# Patient Record
Sex: Female | Born: 1935 | Race: Black or African American | Hispanic: No | Smoking: Never smoker
Health system: Southern US, Community
[De-identification: ages and names within clinical notes are randomized; demographics above are authoritative.]

## PROBLEM LIST (undated history)

## (undated) DIAGNOSIS — G4733 Obstructive sleep apnea (adult) (pediatric): Secondary | ICD-10-CM

## (undated) DIAGNOSIS — D649 Anemia, unspecified: Secondary | ICD-10-CM

## (undated) DIAGNOSIS — I890 Lymphedema, not elsewhere classified: Secondary | ICD-10-CM

## (undated) DIAGNOSIS — E785 Hyperlipidemia, unspecified: Secondary | ICD-10-CM

## (undated) DIAGNOSIS — I509 Heart failure, unspecified: Secondary | ICD-10-CM

## (undated) DIAGNOSIS — E559 Vitamin D deficiency, unspecified: Secondary | ICD-10-CM

## (undated) DIAGNOSIS — K922 Gastrointestinal hemorrhage, unspecified: Secondary | ICD-10-CM

## (undated) DIAGNOSIS — E119 Type 2 diabetes mellitus without complications: Secondary | ICD-10-CM

## (undated) DIAGNOSIS — I10 Essential (primary) hypertension: Secondary | ICD-10-CM

## (undated) DIAGNOSIS — F0390 Unspecified dementia without behavioral disturbance: Secondary | ICD-10-CM

## (undated) DIAGNOSIS — Z9989 Dependence on other enabling machines and devices: Secondary | ICD-10-CM

## (undated) DIAGNOSIS — M1990 Unspecified osteoarthritis, unspecified site: Secondary | ICD-10-CM

## (undated) DIAGNOSIS — N189 Chronic kidney disease, unspecified: Secondary | ICD-10-CM

## (undated) HISTORY — PX: ABDOMINAL HYSTERECTOMY: SHX81

## (undated) HISTORY — DX: Unspecified osteoarthritis, unspecified site: M19.90

## (undated) HISTORY — DX: Gastrointestinal hemorrhage, unspecified: K92.2

## (undated) HISTORY — DX: Vitamin D deficiency, unspecified: E55.9

## (undated) HISTORY — DX: Chronic kidney disease, unspecified: N18.9

## (undated) HISTORY — DX: Heart failure, unspecified: I50.9

## (undated) HISTORY — DX: Lymphedema, not elsewhere classified: I89.0

## (undated) HISTORY — DX: Unspecified dementia, unspecified severity, without behavioral disturbance, psychotic disturbance, mood disturbance, and anxiety: F03.90

## (undated) HISTORY — DX: Hyperlipidemia, unspecified: E78.5

## (undated) HISTORY — DX: Anemia, unspecified: D64.9

## (undated) HISTORY — DX: Type 2 diabetes mellitus without complications: E11.9

---

## 2012-04-06 DIAGNOSIS — M5137 Other intervertebral disc degeneration, lumbosacral region: Secondary | ICD-10-CM | POA: Insufficient documentation

## 2013-09-28 DIAGNOSIS — E1122 Type 2 diabetes mellitus with diabetic chronic kidney disease: Secondary | ICD-10-CM | POA: Insufficient documentation

## 2013-09-28 DIAGNOSIS — E1121 Type 2 diabetes mellitus with diabetic nephropathy: Secondary | ICD-10-CM | POA: Diagnosis present

## 2014-01-31 DIAGNOSIS — E538 Deficiency of other specified B group vitamins: Secondary | ICD-10-CM | POA: Insufficient documentation

## 2014-09-29 DIAGNOSIS — I340 Nonrheumatic mitral (valve) insufficiency: Secondary | ICD-10-CM | POA: Diagnosis present

## 2014-09-29 DIAGNOSIS — I351 Nonrheumatic aortic (valve) insufficiency: Secondary | ICD-10-CM | POA: Diagnosis present

## 2015-03-31 DIAGNOSIS — E559 Vitamin D deficiency, unspecified: Secondary | ICD-10-CM | POA: Insufficient documentation

## 2015-07-10 DIAGNOSIS — M48062 Spinal stenosis, lumbar region with neurogenic claudication: Secondary | ICD-10-CM | POA: Diagnosis present

## 2016-12-20 DIAGNOSIS — K551 Chronic vascular disorders of intestine: Secondary | ICD-10-CM | POA: Diagnosis present

## 2017-04-01 DIAGNOSIS — R600 Localized edema: Secondary | ICD-10-CM

## 2017-04-01 DIAGNOSIS — R609 Edema, unspecified: Secondary | ICD-10-CM

## 2017-06-19 DIAGNOSIS — I5030 Unspecified diastolic (congestive) heart failure: Secondary | ICD-10-CM | POA: Insufficient documentation

## 2017-09-28 DIAGNOSIS — E611 Iron deficiency: Secondary | ICD-10-CM | POA: Insufficient documentation

## 2018-07-08 DIAGNOSIS — E785 Hyperlipidemia, unspecified: Secondary | ICD-10-CM | POA: Diagnosis present

## 2018-07-08 DIAGNOSIS — D631 Anemia in chronic kidney disease: Secondary | ICD-10-CM | POA: Diagnosis present

## 2018-07-08 DIAGNOSIS — E876 Hypokalemia: Secondary | ICD-10-CM | POA: Insufficient documentation

## 2018-07-08 DIAGNOSIS — R001 Bradycardia, unspecified: Secondary | ICD-10-CM | POA: Insufficient documentation

## 2018-07-08 DIAGNOSIS — N189 Chronic kidney disease, unspecified: Secondary | ICD-10-CM | POA: Diagnosis present

## 2021-01-04 DIAGNOSIS — Z8719 Personal history of other diseases of the digestive system: Secondary | ICD-10-CM | POA: Insufficient documentation

## 2021-03-01 DIAGNOSIS — Z7409 Other reduced mobility: Secondary | ICD-10-CM | POA: Insufficient documentation

## 2021-03-01 DIAGNOSIS — F0390 Unspecified dementia without behavioral disturbance: Secondary | ICD-10-CM | POA: Diagnosis present

## 2021-06-02 DIAGNOSIS — K5790 Diverticulosis of intestine, part unspecified, without perforation or abscess without bleeding: Secondary | ICD-10-CM | POA: Insufficient documentation

## 2021-07-01 ENCOUNTER — Emergency Department: Payer: Medicare PPO

## 2021-07-01 ENCOUNTER — Encounter: Payer: Self-pay | Admitting: Internal Medicine

## 2021-07-01 ENCOUNTER — Observation Stay
Admission: EM | Admit: 2021-07-01 | Discharge: 2021-07-02 | Disposition: A | Discharge status: Home / Self Care | Payer: Medicare PPO | Attending: Internal Medicine | Admitting: Internal Medicine

## 2021-07-01 DIAGNOSIS — K219 Gastro-esophageal reflux disease without esophagitis: Secondary | ICD-10-CM

## 2021-07-01 DIAGNOSIS — I130 Hypertensive heart and chronic kidney disease with heart failure and stage 1 through stage 4 chronic kidney disease, or unspecified chronic kidney disease: Secondary | ICD-10-CM | POA: Diagnosis not present

## 2021-07-01 DIAGNOSIS — N183 Chronic kidney disease, stage 3 unspecified: Secondary | ICD-10-CM

## 2021-07-01 DIAGNOSIS — I890 Lymphedema, not elsewhere classified: Secondary | ICD-10-CM | POA: Diagnosis not present

## 2021-07-01 DIAGNOSIS — N179 Acute kidney failure, unspecified: Secondary | ICD-10-CM | POA: Insufficient documentation

## 2021-07-01 DIAGNOSIS — R0602 Shortness of breath: Secondary | ICD-10-CM | POA: Diagnosis present

## 2021-07-01 DIAGNOSIS — R609 Edema, unspecified: Secondary | ICD-10-CM

## 2021-07-01 DIAGNOSIS — F5101 Primary insomnia: Secondary | ICD-10-CM

## 2021-07-01 DIAGNOSIS — G4700 Insomnia, unspecified: Secondary | ICD-10-CM

## 2021-07-01 DIAGNOSIS — R600 Localized edema: Secondary | ICD-10-CM

## 2021-07-01 DIAGNOSIS — I509 Heart failure, unspecified: Secondary | ICD-10-CM | POA: Diagnosis not present

## 2021-07-01 DIAGNOSIS — Z20822 Contact with and (suspected) exposure to covid-19: Secondary | ICD-10-CM | POA: Insufficient documentation

## 2021-07-01 DIAGNOSIS — E1122 Type 2 diabetes mellitus with diabetic chronic kidney disease: Secondary | ICD-10-CM | POA: Insufficient documentation

## 2021-07-01 DIAGNOSIS — M8949 Other hypertrophic osteoarthropathy, multiple sites: Secondary | ICD-10-CM

## 2021-07-01 DIAGNOSIS — G4733 Obstructive sleep apnea (adult) (pediatric): Secondary | ICD-10-CM

## 2021-07-01 DIAGNOSIS — T783XXA Angioneurotic edema, initial encounter: Secondary | ICD-10-CM

## 2021-07-01 DIAGNOSIS — F0390 Unspecified dementia without behavioral disturbance: Secondary | ICD-10-CM | POA: Diagnosis not present

## 2021-07-01 DIAGNOSIS — I10 Essential (primary) hypertension: Secondary | ICD-10-CM

## 2021-07-01 DIAGNOSIS — N1832 Chronic kidney disease, stage 3b: Secondary | ICD-10-CM | POA: Insufficient documentation

## 2021-07-01 DIAGNOSIS — Z9989 Dependence on other enabling machines and devices: Secondary | ICD-10-CM

## 2021-07-01 DIAGNOSIS — M1991 Primary osteoarthritis, unspecified site: Secondary | ICD-10-CM

## 2021-07-01 HISTORY — DX: Obstructive sleep apnea (adult) (pediatric): G47.33

## 2021-07-01 HISTORY — DX: Essential (primary) hypertension: I10

## 2021-07-01 HISTORY — DX: Obstructive sleep apnea (adult) (pediatric): Z99.89

## 2021-07-01 LAB — COMPREHENSIVE METABOLIC PANEL
ALT: 18 U/L (ref 0–44)
AST: 21 U/L (ref 15–41)
Albumin: 3.4 g/dL — ABNORMAL LOW (ref 3.5–5.0)
Alkaline Phosphatase: 76 U/L (ref 38–126)
Anion gap: 6 (ref 5–15)
BUN: 24 mg/dL — ABNORMAL HIGH (ref 8–23)
CO2: 29 mmol/L (ref 22–32)
Calcium: 8.6 mg/dL — ABNORMAL LOW (ref 8.9–10.3)
Chloride: 109 mmol/L (ref 98–111)
Creatinine, Ser: 1.49 mg/dL — ABNORMAL HIGH (ref 0.44–1.00)
GFR, Estimated: 32 mL/min — ABNORMAL LOW (ref >60)
Glucose, Bld: 127 mg/dL — ABNORMAL HIGH (ref 70–99)
Potassium: 3.7 mmol/L (ref 3.5–5.1)
Sodium: 144 mmol/L (ref 135–145)
Total Bilirubin: 1.3 mg/dL — ABNORMAL HIGH (ref 0.3–1.2)
Total Protein: 6.8 g/dL (ref 6.5–8.1)

## 2021-07-01 LAB — CBC WITH DIFFERENTIAL/PLATELET
Abs Immature Granulocytes: 0.06 10*3/uL (ref 0.00–0.07)
Basophils Absolute: 0 10*3/uL (ref 0.0–0.1)
Basophils Relative: 0 %
Eosinophils Absolute: 0.2 10*3/uL (ref 0.0–0.5)
Eosinophils Relative: 1 %
HCT: 31.9 % — ABNORMAL LOW (ref 36.0–46.0)
Hemoglobin: 10.1 g/dL — ABNORMAL LOW (ref 12.0–15.0)
Immature Granulocytes: 1 %
Lymphocytes Relative: 17 %
Lymphs Abs: 1.7 10*3/uL (ref 0.7–4.0)
MCH: 28.9 pg (ref 26.0–34.0)
MCHC: 31.7 g/dL (ref 30.0–36.0)
MCV: 91.4 fL (ref 80.0–100.0)
Monocytes Absolute: 0.9 10*3/uL (ref 0.1–1.0)
Monocytes Relative: 8 %
Neutro Abs: 7.7 10*3/uL (ref 1.7–7.7)
Neutrophils Relative %: 73 %
Platelets: 225 10*3/uL (ref 150–400)
RBC: 3.49 MIL/uL — ABNORMAL LOW (ref 3.87–5.11)
RDW: 17.2 % — ABNORMAL HIGH (ref 11.5–15.5)
WBC: 10.5 10*3/uL (ref 4.0–10.5)
nRBC: 0.2 % (ref 0.0–0.2)

## 2021-07-01 LAB — MAGNESIUM: Magnesium: 2.3 mg/dL (ref 1.7–2.4)

## 2021-07-01 LAB — PROCALCITONIN: Procalcitonin: <0.1 ng/mL

## 2021-07-01 LAB — RESP PANEL BY RT-PCR (FLU A&B, COVID) ARPGX2
Influenza A by PCR: NEGATIVE
Influenza B by PCR: NEGATIVE
SARS Coronavirus 2 by RT PCR: NEGATIVE

## 2021-07-01 LAB — GLUCOSE, CAPILLARY
Glucose-Capillary: 145 mg/dL — ABNORMAL HIGH (ref 70–99)
Glucose-Capillary: 115 mg/dL — ABNORMAL HIGH (ref 70–99)

## 2021-07-01 LAB — TROPONIN I (HIGH SENSITIVITY)
Troponin I (High Sensitivity): 72 ng/L — ABNORMAL HIGH (ref <18)
Troponin I (High Sensitivity): 77 ng/L — ABNORMAL HIGH (ref <18)

## 2021-07-01 LAB — BRAIN NATRIURETIC PEPTIDE: B Natriuretic Peptide: 1332.5 pg/mL — ABNORMAL HIGH (ref 0.0–100.0)

## 2021-07-01 LAB — TSH: TSH: 3.842 u[IU]/mL (ref 0.350–4.500)

## 2021-07-01 MED ORDER — TAB-A-VITE/IRON PO TABS
1.0000 | ORAL_TABLET | Freq: Every day | ORAL | Status: DC
  Administered 2021-07-01: 1 via ORAL
  Filled 2021-07-01 (×2): qty 1

## 2021-07-01 MED ORDER — SODIUM CHLORIDE 0.9% FLUSH
3.0000 mL | Freq: Two times a day (BID) | INTRAVENOUS | Status: DC
  Administered 2021-07-01 – 2021-07-02 (×2): 3 mL via INTRAVENOUS

## 2021-07-01 MED ORDER — AMLODIPINE BESYLATE 5 MG PO TABS
7.5000 mg | ORAL_TABLET | Freq: Every day | ORAL | Status: DC
  Administered 2021-07-01 – 2021-07-02 (×2): 7.5 mg via ORAL
  Filled 2021-07-01 (×2): qty 2

## 2021-07-01 MED ORDER — HYDRALAZINE HCL 20 MG/ML IJ SOLN: 5.0000 mg | Freq: Four times a day (QID) | INTRAMUSCULAR | Status: DC | PRN

## 2021-07-01 MED ORDER — ACETAMINOPHEN 325 MG PO TABS: 650.0000 mg | ORAL_TABLET | ORAL | Status: DC | PRN

## 2021-07-01 MED ORDER — ENOXAPARIN SODIUM 30 MG/0.3ML IJ SOSY
30.0000 mg | PREFILLED_SYRINGE | Freq: Every day | INTRAMUSCULAR | Status: DC
  Administered 2021-07-01: 30 mg via SUBCUTANEOUS
  Filled 2021-07-01: qty 0.3

## 2021-07-01 MED ORDER — VITAMIN B-12 1000 MCG PO TABS
1000.0000 ug | ORAL_TABLET | Freq: Every day | ORAL | Status: DC
  Administered 2021-07-01 – 2021-07-02 (×2): 1000 ug via ORAL
  Filled 2021-07-01 (×2): qty 1

## 2021-07-01 MED ORDER — FUROSEMIDE 10 MG/ML IJ SOLN
40.0000 mg | Freq: Every day | INTRAMUSCULAR | Status: DC
  Administered 2021-07-02: 40 mg via INTRAVENOUS
  Filled 2021-07-01: qty 4

## 2021-07-01 MED ORDER — VITAMIN D 25 MCG (1000 UNIT) PO TABS
1000.0000 [IU] | ORAL_TABLET | Freq: Every day | ORAL | Status: DC
  Administered 2021-07-01 – 2021-07-02 (×2): 1000 [IU] via ORAL
  Filled 2021-07-01 (×2): qty 1

## 2021-07-01 MED ORDER — ATORVASTATIN CALCIUM 20 MG PO TABS
40.0000 mg | ORAL_TABLET | Freq: Every day | ORAL | Status: DC
  Administered 2021-07-01: 40 mg via ORAL
  Filled 2021-07-01: qty 2

## 2021-07-01 MED ORDER — SODIUM CHLORIDE 0.9 % IV SOLN: 250.0000 mL | INTRAVENOUS | Status: DC | PRN

## 2021-07-01 MED ORDER — CLONIDINE HCL 0.1 MG PO TABS
0.2000 mg | ORAL_TABLET | Freq: Two times a day (BID) | ORAL | Status: DC
  Administered 2021-07-01 – 2021-07-02 (×2): 0.2 mg via ORAL
  Filled 2021-07-01 (×2): qty 2

## 2021-07-01 MED ORDER — ONDANSETRON HCL 4 MG/2ML IJ SOLN: 4.0000 mg | Freq: Four times a day (QID) | INTRAMUSCULAR | Status: DC | PRN

## 2021-07-01 MED ORDER — SODIUM CHLORIDE 0.9% FLUSH: 3.0000 mL | INTRAVENOUS | Status: DC | PRN

## 2021-07-01 MED ORDER — FUROSEMIDE 10 MG/ML IJ SOLN
80.0000 mg | Freq: Once | INTRAMUSCULAR | Status: AC
  Administered 2021-07-01: 80 mg via INTRAVENOUS
  Filled 2021-07-01: qty 8

## 2021-07-01 MED ORDER — ISOSORBIDE MONONITRATE ER 30 MG PO TB24
30.0000 mg | ORAL_TABLET | Freq: Every day | ORAL | Status: DC
  Administered 2021-07-01 – 2021-07-02 (×2): 30 mg via ORAL
  Filled 2021-07-01 (×2): qty 1

## 2021-07-01 NOTE — ED Provider Notes (Signed)
Does have  [facility]  ____________________________________________   Event Date/Time   First MD Initiated Contact with Patient 07/01/21 1458     (approximate)  I have reviewed the triage vital signs and the nursing notes.   HISTORY  Chief Complaint Shortness of Breath (Fluid in lungs, wheezing , pt from home )    HPI [name] [name] is a 87 y.o. female past medical history of lymphedema, CKD, congestive heart failure who presents with cough.  Symptoms started several days ago.  She endorses cough that is productive of clear sputum.  Has not had any fevers or chills.  She denies dyspnea or chest pain at this time.  Denies any increase in her lower extremity swelling.  Denies abdominal pain, nausea vomiting.  Patient is accompanied by her daughter who notes that the cough has been going on for several days and she has looked more short of breath than normal.  Patient is nonambulatory at baseline.  She tells me that the patient has a history of CKD and since a hospitalization last month for GI bleed she is not taking any diuretic.         No past medical history on file.  There are no problems to display for this patient.  PMH CKD CHF GI bleeding Prior to Admission medications   Not on File    Allergies Patient has no known allergies.  No family history on file.  Social History    Review of Systems   Review of Systems  Constitutional:  Negative for chills and fever.  Respiratory:  Positive for cough. Negative for chest tightness and shortness of breath.   Cardiovascular:  Positive for leg swelling. Negative for chest pain.  Gastrointestinal:  Negative for abdominal pain, nausea and vomiting.  All other systems reviewed and are negative.  Physical Exam Updated Vital Signs BP (!) 199/96 (BP Location: Right Arm)   Pulse 90   Temp 98.5 F (36.9 C) (Oral)   Resp 20   SpO2 96%   Physical Exam Vitals and nursing note reviewed.   Constitutional:      General: She is not in acute distress.    Appearance: Normal appearance. She is obese. She is not toxic-appearing.  HENT:     Head: Normocephalic and atraumatic.     Nose: Nose normal. No congestion.     Mouth/Throat:     Mouth: Mucous membranes are moist.  Eyes:     General: No scleral icterus.    Conjunctiva/sclera: Conjunctivae normal.  Cardiovascular:     Rate and Rhythm: Normal rate and regular rhythm.  Pulmonary:     Effort: Pulmonary effort is normal. No respiratory distress.     Breath sounds: Normal breath sounds. No wheezing.     Comments: Crackles/rales primarily in the right lower lung field Abdominal:     Palpations: Abdomen is soft.     Tenderness: There is no abdominal tenderness. There is no guarding.  Musculoskeletal:        General: No swelling, tenderness, deformity or signs of injury. Normal range of motion.     Cervical back: Neck supple. No rigidity.     Right lower leg: Edema present.     Left lower leg: Edema present.     Comments: Pitting edema the bilateral lower extremities  Skin:    General: Skin is warm and dry.     Coloration: Skin is not jaundiced.  Neurological:     General: No focal deficit present.  Mental Status: She is alert and oriented to person, place, and time.  Psychiatric:        Mood and Affect: Mood normal.        Behavior: Behavior normal.     LABS (all labs ordered are listed, but only abnormal results are displayed)  Labs Reviewed  COMPREHENSIVE METABOLIC PANEL - Abnormal; Notable for the following components:      Result Value   Glucose, Bld 127 (*)    BUN 24 (*)    Creatinine, Ser 1.49 (*)    Calcium 8.6 (*)    Albumin 3.4 (*)    Total Bilirubin 1.3 (*)    GFR, Estimated 32 (*)    All other components within normal limits  CBC WITH DIFFERENTIAL/PLATELET - Abnormal; Notable for the following components:   RBC 3.49 (*)    Hemoglobin 10.1 (*)    HCT 31.9 (*)    RDW 17.2 (*)    All other  components within normal limits  BRAIN NATRIURETIC PEPTIDE - Abnormal; Notable for the following components:   B Natriuretic Peptide 1,332.5 (*)    All other components within normal limits  TROPONIN I (HIGH SENSITIVITY) - Abnormal; Notable for the following components:   Troponin I (High Sensitivity) 72 (*)    All other components within normal limits  RESP PANEL BY RT-PCR (FLU A&B, COVID) [id number]   ____________________________________________  EKG  Normal sinus rhythm, normal axis, normal intervals, diffuse T wave flattening, no acute ischemic changes ____________________________________________  RADIOLOGY I, Madelin Headings, personally viewed and evaluated these images (plain radiographs) as part of my medical decision making, as well as reviewing the written report by the radiologist.  ED MD interpretation: I reviewed the chest x-ray which shows congestion, right greater than left    ____________________________________________   PROCEDURES  Procedure(s) performed (including Critical Care):  Procedures   ____________________________________________   INITIAL IMPRESSION / ASSESSMENT AND PLAN / ED COURSE     87 year old female presenting with cough and dyspnea.  Patient overall is not in significant respiratory distress she can speak in full sentences.  She is satting in the mid 90s at rest.  She has chronic lymphedema so difficult to tell whether she is truly volume overloaded or this is chronic  Some rales at the bases right greater than left.  Will get chest x-ray and blood work.  Differential diagnosis includes CHF exacerbation, pneumonia, bronchitis.  Physician pending blood work and labs.  Patient's BNP is elevated over 1000, troponin 70 although she is not having any active chest pain suspect this is demand.  Her x-ray does look volume overloaded, but is somewhat asymmetric.  She has no leukocytosis no fever, will defer antibiotics at this time.  Will admit for CHF  exacerbation.  I suspect that this is in the setting of being off of her diuretics for the last month.  Clinical Course as of 07/01/21 2104  Nancy Fetter Jul 01, 2021  1619 B Natriuretic Peptide(!): 1,332.5 [KM]    Clinical Course User Index [KM] Rada Hay, MD     ____________________________________________   FINAL CLINICAL IMPRESSION(S) / ED DIAGNOSES  Final diagnoses:  Acute on chronic congestive heart failure, unspecified heart failure type [facility])     ED Discharge Orders     None        Note:  This document was prepared using Dragon voice recognition software and may include unintentional dictation errors.    Rada Hay, MD 07/01/21 2105

## 2021-07-01 NOTE — ED Triage Notes (Signed)
Pt has no co, ems was called to her house , wheezing , fluid overload

## 2021-07-01 NOTE — ED Notes (Signed)
Bilateral leg swelling per ems stated more fluid than normal

## 2021-07-01 NOTE — H&P (Addendum)
History and Physical   [name] [name] [id number] DOB: Nov 17, 1924 DOA: 07/01/2021  PCP: Melba Coon, MD  Outpatient Specialists: Dr. Wynonia Musty, [facility] pulmonologist Patient coming from: Home via EMS  I have personally briefly reviewed patient's old medical records in [place].  Chief Concern: Cough and shortness of breath  HPI: [name] [name] is a 87 y.o. female with medical history significant for CKD 3B,/CKD 4, insomnia, impaired mobility, chronic acquired lymphedema, dementia without behavioral disturbance, diabetic polyneuropathy, history of GI bleed, iron deficiency anemia, OSA on CPAP, heart failure with preserved ejection fraction, primary osteoarthritis, history of angioedema in 2015, nonrheumatic mitral GERD agitation, who presents emergency department for chief concerns of shortness of breath and cough.  At bedside, she is able to tell me her name, age, current location of hospital, current calendar year, and current POTUS. She can wiggle her toes and slightly bend her knees bilaterally and equally.   She reports she has been coughing for 4 days and worse int eh last two days. She dnies fever, nausea, vomiting, syncope, lost of consciousness, dysuria, diarrhea, hematuria.   She states that she has not walked since being discharge from the hospital in August. She did get physical therapy.   Cough is nonproductive. She denies chest pain, syncope, loss of consciousness, loss of appetite, nausea, vomiting, abdominal pain, headache, vision changes.  Social history: She lives with her granddaughter. She denies tobacco use, etoh, recreational drug use. She was a formerly worked in Clinical cytogeneticist, [employer] in [place].   Vaccination history: She is vaccinated for covid 19, all the doses.  ROS: Constitutional: no weight change, no fever ENT/Mouth: no sore throat, no rhinorrhea Eyes: no eye pain, no vision changes Cardiovascular: no chest pain, no dyspnea,  no  edema, no palpitations Respiratory: no cough, no sputum, no wheezing Gastrointestinal: no nausea, no vomiting, no diarrhea, no constipation Genitourinary: no urinary incontinence, no dysuria, no hematuria Musculoskeletal: no arthralgias, no myalgias Skin: no skin lesions, no pruritus, Neuro: + weakness, no loss of consciousness, no syncope Psych: no anxiety, no depression, no decrease appetite Heme/Lymph: no bruising, no bleeding  ED Course: Discussed with emergency medicine provider, patient requiring hospitalization for chief concerns of heart failure exacerbation.  Vitals in the emergency department was remarkable for 98.5, respiration rate of 20, heart rate of 90, initial blood pressure 199/96, and decreased to 181/82.  In the emergency department patient was given furosemide 80 mg IV once.  Assessment/Plan  Principal Problem:   Acute exacerbation of CHF (congestive heart failure) (HCC) Active Problems:   CKD (chronic kidney disease), stage III (HCC)   Essential hypertension   Lymphedema   OSA on CPAP   GERD (gastroesophageal reflux disease)   Insomnia   Primary osteoarthritis   Angioedema   # Shortness of breath and cough for 3 days-etiology work-up in progress, presumed secondary to heart failure exacerbation in setting of torsemide discontinuation vs right lower lobe pneumonia - Increased bilateral lower extremity swelling - Patient with discontinued on torsemide on discharge and was asked recommended to resume once patient's creatinine returns to baseline which is 1.8 - Strict I's and O's - Complete echo  # Hypertension-elevated, patient did not take her blood pressure medications prior to presentation - Amlodipine 7.5 mg p.o. daily resumed, clonidine 0.2 mg p.o. twice daily, isosorbide mononitrate 30 mg p.o. daily resumed - Hydralazine 5 mg IV every 6 hours as needed for SBP greater than 170  # CKD stage IIIb - Serum creatinine on presentation  is 1.49, GFR 32, this  is her baseline - Per chart review patient's serum creatinine baseline appears to be 1.74 to 1.89  # Left lower pulmonary mass versus consolidation - Given that there was no read of left lower lobe pulmonary mass on chest x-ray in care everywhere, low clinical suspicion for mass at this time - I suspect this is secondary to pneumonia - Check procalcitonin, if positive would recommend a.m. team to add azithromycin, ceftriaxone - Check 20 pathogen respiratory panel - Extensively discussed the findings on x-ray with granddaughter and recommended that she follow-up with primary care doctor for repeat chest x-ray to ensure resolution and or continue work-up for possible carcinoma  # Hyperlipidemia - 40 mg p.o. nightly resumed # OSA on CPAP- CPAP nightly ordered # History of nonrheumatic mitral regurgitation  Chart reviewed.   06/12/2019: Patient was advised to start torsemide 20 mg daily.  05/28/2021 to 06/08/2021: [facility] admission for lower GI bleed.  Patient was transferred to MICU due to large volume blood loss, hypotension, CT scan showing evidence of infectious colitis. 05/30/2021: Patient was transfused RBC.  After stabilization patient was transferred to [facility] to complete antibiotic therapy, initially started on Zosyn and then transition to Cipro and Flagyl.  At discharge [facility] home health agency was employed for home physical therapy and Occupational Therapy.  Torsemide, valsartan, potassium supplementation were temporarily discontinued until creatinine returns to baseline creatinine of 1.8.  Consider IV iron therapy.  Clonidine was decreased to 0.2 mg twice daily due to hypotension from GI bleed.  Right upper extremity swelling due to acute obstruction of the cephalic vein at the ACF wrist.  No evidence of DVT.  06/07/2021 bilateral venous duplex was read as no evidence of DVT detected in the left or right central veins. Evidence of acute superficial venous obstruction in the cephalic vein  on the right.  07/27/2014: History of angioedema  DVT prophylaxis: Enoxaparin 40 mg subcutaneous nightly Code Status: DNR/DNI Diet: Heart healthy Family Communication: called Tia Alert, grand daughter Disposition Plan: Pending clinical course Consults called: None at this time Admission status: MedSurg, observation, telemetry  Past Medical History:  Diagnosis Date   Hypertension    OSA on CPAP    Past Surgical History:  Procedure Laterality Date   ABDOMINAL HYSTERECTOMY     Social History:  reports that she has never smoked. She has never used smokeless tobacco. She reports that she does not drink alcohol and does not use drugs.  Allergies  Allergen Reactions   Codeine Swelling   Lisinopril Swelling    Other reaction(s): Other angioedema    Prednisolone     Other reaction(s): Other Causes increased blood pressure   History reviewed. No pertinent family history. Family history: Family history reviewed and not pertinent  Prior to Admission medications   Not on File   Physical Exam: Vitals:   07/01/21 1459 07/01/21 1700 07/01/21 1836  BP: (!) 199/96 (!) 181/82 (!) 169/91  Pulse: 90  82  Resp: 20  18  Temp: 98.5 F (36.9 C)    TempSrc: Oral    SpO2: 96%    Weight:   81.7 kg   Constitutional: appears younger than age, frail, NAD, calm, comfortable Eyes: PERRL, lids and conjunctivae normal ENMT: Mucous membranes are moist. Posterior pharynx clear of any exudate or lesions. Age-appropriate dentition. Hearing appropriate Neck: normal, supple, no masses, no thyromegaly Respiratory: clear to auscultation bilaterally, no wheezing, no crackles. Normal respiratory effort. No accessory muscle use.  Cardiovascular:  Regular rate and rhythm, no murmurs / rubs / gallops. No extremity edema. 2+ pedal pulses. No carotid bruits.  Abdomen: no tenderness, no masses palpated, no hepatosplenomegaly. Bowel sounds positive.  Musculoskeletal: no clubbing / cyanosis. No joint deformity  upper and lower extremities. Good ROM, no contractures, no atrophy. Normal muscle tone.  Skin: no rashes, lesions, ulcers. No induration Neurologic: Sensation intact. Strength 5/5 in all 4.  Psychiatric: Normal judgment and insight. Alert and oriented x 3. Normal mood.   EKG: independently reviewed, showing sinus rhythm with rate of 71, QTc 461  Chest x-ray on Admission: I personally reviewed and I agree with radiologist reading as below.  DG Chest Portable 1 View  Result Date: 07/01/2021 CLINICAL DATA:  Increased shortness of breath. EXAM: PORTABLE CHEST 1 VIEW COMPARISON:  None. FINDINGS: The cardiac silhouette is enlarged. Calcific atherosclerotic disease and tortuosity of the aorta. Eventration of the right hemidiaphragm. Pulmonary vascular congestion. Pulmonary mass versus superimposed enlarged vascular structures are seen in the right hilum. There is no evidence of focal airspace consolidation, pleural effusion or pneumothorax. Osseous structures are without acute abnormality. Soft tissues are grossly normal. IMPRESSION: 1. Pulmonary vascular congestion. 2. Pulmonary mass versus superimposed enlarged vascular structures in the right hilum. Attention on future radiographs recommended. Electronically Signed   By: Fidela Salisbury M.D.   On: 07/01/2021 15:59    Labs on Admission: I have personally reviewed following labs  CBC: Recent Labs  Lab 07/01/21 1507  WBC 10.5  NEUTROABS 7.7  HGB 10.1*  HCT 31.9*  MCV 91.4  PLT [id number]   Basic Metabolic Panel: Recent Labs  Lab 07/01/21 1507  NA 144  K 3.7  CL 109  CO2 29  GLUCOSE 127*  BUN 24*  CREATININE 1.49*  CALCIUM 8.6*  MG 2.3   GFR: CrCl cannot be calculated (Unknown ideal weight.).  Liver Function Tests: Recent Labs  Lab 07/01/21 1507  AST 21  ALT 18  ALKPHOS 76  BILITOT 1.3*  PROT 6.8  ALBUMIN 3.4*   Dr. Tobie Poet [facility]  If 7PM-7AM, please contact overnight-coverage provider If 7AM-7PM, please contact  day coverage provider [email or web address]  07/01/2021, 7:04 PM

## 2021-07-01 NOTE — Progress Notes (Signed)
PHARMACIST - PHYSICIAN COMMUNICATION  CONCERNING:  Enoxaparin (Lovenox) for DVT Prophylaxis   DESCRIPTION: Patient was prescribed enoxaprin '40mg'$  q24 hours for VTE prophylaxis.   Filed Weights   07/01/21 1836  Weight: 81.7 kg (180 lb 1.9 oz)    Body mass index is 31.91 kg/m.  Estimated Creatinine Clearance: 23.4 mL/min (A) (by C-G formula based on SCr of 1.49 mg/dL (H)).   Patient is candidate for enoxaparin '30mg'$  every 24 hours based on CrCl <70m/min or Weight <45kg  RECOMMENDATION: [facility]  Patient is now receiving enoxaparin 30 mg every 24 hours    MDarnelle Bos PharmD Clinical Pharmacist  07/01/2021 10:14 PM

## 2021-07-02 ENCOUNTER — Observation Stay (HOSPITAL_BASED_OUTPATIENT_CLINIC_OR_DEPARTMENT_OTHER)
Admit: 2021-07-02 | Discharge: 2021-07-02 | Disposition: A | Discharge status: Home / Self Care | Payer: Medicare PPO | Attending: Internal Medicine | Admitting: Internal Medicine

## 2021-07-02 ENCOUNTER — Observation Stay: Payer: Medicare PPO

## 2021-07-02 DIAGNOSIS — R0609 Other forms of dyspnea: Secondary | ICD-10-CM

## 2021-07-02 DIAGNOSIS — N1832 Chronic kidney disease, stage 3b: Secondary | ICD-10-CM | POA: Diagnosis not present

## 2021-07-02 DIAGNOSIS — I890 Lymphedema, not elsewhere classified: Secondary | ICD-10-CM

## 2021-07-02 DIAGNOSIS — I509 Heart failure, unspecified: Secondary | ICD-10-CM | POA: Diagnosis not present

## 2021-07-02 DIAGNOSIS — K219 Gastro-esophageal reflux disease without esophagitis: Secondary | ICD-10-CM | POA: Diagnosis not present

## 2021-07-02 DIAGNOSIS — R0602 Shortness of breath: Secondary | ICD-10-CM

## 2021-07-02 LAB — CBC WITH DIFFERENTIAL/PLATELET
Abs Immature Granulocytes: 0.04 10*3/uL (ref 0.00–0.07)
Basophils Absolute: 0 10*3/uL (ref 0.0–0.1)
Basophils Relative: 0 %
Eosinophils Absolute: 0.2 10*3/uL (ref 0.0–0.5)
Eosinophils Relative: 2 %
HCT: 26.3 % — ABNORMAL LOW (ref 36.0–46.0)
Hemoglobin: 8.3 g/dL — ABNORMAL LOW (ref 12.0–15.0)
Immature Granulocytes: 1 %
Lymphocytes Relative: 17 %
Lymphs Abs: 1.2 10*3/uL (ref 0.7–4.0)
MCH: 28.6 pg (ref 26.0–34.0)
MCHC: 31.6 g/dL (ref 30.0–36.0)
MCV: 90.7 fL (ref 80.0–100.0)
Monocytes Absolute: 0.9 10*3/uL (ref 0.1–1.0)
Monocytes Relative: 12 %
Neutro Abs: 5 10*3/uL (ref 1.7–7.7)
Neutrophils Relative %: 68 %
Platelets: 190 10*3/uL (ref 150–400)
RBC: 2.9 MIL/uL — ABNORMAL LOW (ref 3.87–5.11)
RDW: 16.8 % — ABNORMAL HIGH (ref 11.5–15.5)
WBC: 7.4 10*3/uL (ref 4.0–10.5)
nRBC: 0 % (ref 0.0–0.2)

## 2021-07-02 LAB — BASIC METABOLIC PANEL
Anion gap: 6 (ref 5–15)
BUN: 23 mg/dL (ref 8–23)
CO2: 30 mmol/L (ref 22–32)
Calcium: 8 mg/dL — ABNORMAL LOW (ref 8.9–10.3)
Chloride: 109 mmol/L (ref 98–111)
Creatinine, Ser: 1.46 mg/dL — ABNORMAL HIGH (ref 0.44–1.00)
GFR, Estimated: 33 mL/min — ABNORMAL LOW (ref >60)
Glucose, Bld: 109 mg/dL — ABNORMAL HIGH (ref 70–99)
Potassium: 3.4 mmol/L — ABNORMAL LOW (ref 3.5–5.1)
Sodium: 145 mmol/L (ref 135–145)

## 2021-07-02 LAB — ECHOCARDIOGRAM COMPLETE
AR max vel: 2.28 cm2
AV Area VTI: 3.11 cm2
AV Area mean vel: 2.26 cm2
AV Mean grad: 2 mmHg
AV Peak grad: 4.5 mmHg
Ao pk vel: 1.06 m/s
Area-P 1/2: 4.06 cm2
Height: 63 in
S' Lateral: 4 cm
Weight: 2797.2 oz

## 2021-07-02 LAB — GLUCOSE, CAPILLARY: Glucose-Capillary: 173 mg/dL — ABNORMAL HIGH (ref 70–99)

## 2021-07-02 MED ORDER — TORSEMIDE 20 MG PO TABS: 20.0000 mg | ORAL_TABLET | Freq: Every day | ORAL | 1 refills | Status: DC

## 2021-07-02 MED ORDER — TORSEMIDE 20 MG PO TABS: 20.0000 mg | ORAL_TABLET | Freq: Every day | ORAL | Status: DC

## 2021-07-02 NOTE — Progress Notes (Signed)
*  PRELIMINARY RESULTS* Echocardiogram 2D Echocardiogram has been performed.  [name] [name] 07/02/2021, 9:19 AM

## 2021-07-02 NOTE — Progress Notes (Signed)
Patient being discharged home. No change in previous assessment. No complaints of pain. Granddaughter here to take patient home and went over d/c instructions with patient and granddaughter.

## 2021-07-02 NOTE — Plan of Care (Signed)
  Problem: Health Behavior/Discharge Planning: Goal: Ability to manage health-related needs will improve Outcome: Completed/Met   Problem: Clinical Measurements: Goal: Diagnostic test results will improve Outcome: Completed/Met Goal: Respiratory complications will improve Outcome: Completed/Met Goal: Cardiovascular complication will be avoided Outcome: Completed/Met

## 2021-07-02 NOTE — Discharge Summary (Signed)
Physician Discharge Summary  [name] [name] [id number] DOB: August 14, 1925 DOA: 07/01/2021  PCP: Melba Coon, MD  Admit date: 07/01/2021 Discharge date: 07/02/2021  Admitted From: Home Disposition: Home  Recommendations for Outpatient Follow-up:  Follow up with PCP in 1-2 weeks Follow-up with cardiology in 1 to 2 weeks Follow-up with nephrology Please obtain BMP/CBC in one week Please follow up on the following pending results: Echocardiogram result.  Home Health: Yes Equipment/Devices: Wheelchair Discharge Condition: Stable CODE STATUS: DNR Diet recommendation: Heart Healthy   Brief/Interim Summary: [name] [name] is a 87 y.o. female with medical history significant for CKD 3B,/CKD 4, insomnia, impaired mobility, chronic acquired lymphedema, dementia without behavioral disturbance, diabetic polyneuropathy, history of GI bleed, iron deficiency anemia, OSA on CPAP, heart failure with preserved ejection fraction, primary osteoarthritis, history of angioedema in 2015, nonrheumatic mitral GERD agitation, who presents emergency department for chief concerns of shortness of breath and cough for about 4 days.  Patient was recently admitted at [facility] for lower GI bleed.  Where she did developed hemorrhagic shock, received blood transfusions.  CT scan at that time with evidence of infectious colitis.  She received Zosyn followed by Cipro and Flagyl during that hospitalization.  Her home dose of torsemide was held due to AKI and was advised to resume once renal function improves.  Patient never restarted her torsemide and having some worsening of lower extremity edema and shortness of breath.  Responded to IV Lasix.  Next morning she feels at her baseline.  Patient do have significant lymphedema at baseline.  She is wheelchair-bound at baseline and granddaughter helps with ADLs along with the help of home health services. Echocardiogram done-pending results.  PCP or cardiologist should be able to  follow-up.  Kidney functions at her baseline-had an history of CKD stage IIIb/IV.  Torsemide was restarted at 20 mg daily and she will follow-up with her cardiologist or nephrologist closely and they can uptitrate accordingly.  She will resume rest of her home medications and follow-up with her providers.   Discharge Diagnoses:  Principal Problem:   Acute exacerbation of CHF (congestive heart failure) (HCC) Active Problems:   CKD (chronic kidney disease), stage III (HCC)   Essential hypertension   Lymphedema   OSA on CPAP   GERD (gastroesophageal reflux disease)   Insomnia   Primary osteoarthritis   Angioedema   SOB (shortness of breath)   Discharge Instructions  Discharge Instructions     (HEART FAILURE PATIENTS) Call MD:  Anytime you have any of the following symptoms: 1) 3 pound weight gain in 24 hours or 5 pounds in 1 week 2) shortness of breath, with or without a dry hacking cough 3) swelling in the hands, feet or stomach 4) if you have to sleep on extra pillows at night in order to breathe.   Complete by: As directed    Diet - low sodium heart healthy   Complete by: As directed    Discharge instructions   Complete by: As directed    It was pleasure taking care of you. We are restarting low-dose torsemide-please follow-up with your cardiologist closely for further dose adjustment.   Increase activity slowly   Complete by: As directed       Allergies as of 07/02/2021       Reactions   Codeine Swelling   Lisinopril Swelling   Other reaction(s): Other angioedema   Prednisolone    Other reaction(s): Other Causes increased blood pressure        Medication List  TAKE these medications    acetaminophen 500 MG tablet Commonly known as: TYLENOL Take 500 mg by mouth every 6 (six) hours as needed for pain.   amLODipine 2.5 MG tablet Commonly known as: NORVASC Take 7.5 mg by mouth daily.   atorvastatin 40 MG tablet Commonly known as: LIPITOR Take 40 mg  by mouth at bedtime.   cholecalciferol 25 MCG (1000 UNIT) tablet Commonly known as: VITAMIN D3 Take 1,000 Units by mouth daily.   cloNIDine 0.2 MG tablet Commonly known as: CATAPRES Take 0.2 mg by mouth 2 (two) times daily.   Ferro-Sequels 65-25 MG Tbcr Generic drug: Ferrous Fumarate-Vitamin C ER Take 1 tablet by mouth at bedtime.   isosorbide mononitrate 30 MG 24 hr tablet Commonly known as: IMDUR Take 30 mg by mouth daily.   torsemide 20 MG tablet Commonly known as: DEMADEX Take 1 tablet (20 mg total) by mouth daily. Start taking on: July 03, 2021   vitamin B-12 1000 MCG tablet Commonly known as: CYANOCOBALAMIN Take 1,000 mcg by mouth daily.        Follow-up Information     [name], [name], MD. Schedule an appointment as soon as possible for a visit.   Specialty: Internal Medicine Contact information: [place] [address] [phone]                Allergies  Allergen Reactions   Codeine Swelling   Lisinopril Swelling    Other reaction(s): Other angioedema    Prednisolone     Other reaction(s): Other Causes increased blood pressure    Consultations: None  Procedures/Studies: DG Chest Portable 1 View  Result Date: 07/01/2021 CLINICAL DATA:  Increased shortness of breath. EXAM: PORTABLE CHEST 1 VIEW COMPARISON:  None. FINDINGS: The cardiac silhouette is enlarged. Calcific atherosclerotic disease and tortuosity of the aorta. Eventration of the right hemidiaphragm. Pulmonary vascular congestion. Pulmonary mass versus superimposed enlarged vascular structures are seen in the right hilum. There is no evidence of focal airspace consolidation, pleural effusion or pneumothorax. Osseous structures are without acute abnormality. Soft tissues are grossly normal. IMPRESSION: 1. Pulmonary vascular congestion. 2. Pulmonary mass versus superimposed enlarged vascular structures in the right hilum. Attention on future radiographs  recommended. Electronically Signed   By: Fidela Salisbury M.D.   On: 07/01/2021 15:59    Subjective: Patient was seen and examined today.  Granddaughter at bedside who is also her caretaker.  Patient was feeling at baseline.  Denies any shortness of breath, chest pain.  Wants to go home.  Discharge Exam: Vitals:   07/02/21 0813 07/02/21 1121  BP: (!) 173/67 (!) 147/60  Pulse: 81 72  Resp: 20 20  Temp: 99 F (37.2 C) 98.2 F (36.8 C)  SpO2: 96% 96%   Vitals:   07/02/21 0043 07/02/21 0541 07/02/21 0813 07/02/21 1121  BP: (!) 147/66 (!) 170/69 (!) 173/67 (!) 147/60  Pulse: 73 76 81 72  Resp: '20 20 20 20  '$ Temp: (!) 100.8 F (38.2 C) 99.1 F (37.3 C) 99 F (37.2 C) 98.2 F (36.8 C)  TempSrc: Oral     SpO2: 100% 99% 96% 96%  Weight:  79.3 kg    Height:        General: Pt is alert, awake, not in acute distress Cardiovascular: RRR, S1/S2 +, no rubs, no gallops Respiratory: CTA bilaterally, no wheezing, no rhonchi Abdominal: Soft, NT, ND, bowel sounds + Extremities: Lymphedema with 1+ lower extremity pitting edema.  The results of significant diagnostics from this  hospitalization (including imaging, microbiology, ancillary and laboratory) are listed below for reference.    Microbiology: Recent Results (from the past 240 hour(s))  Resp Panel by RT-PCR (Flu A&B, Covid) Nasopharyngeal Swab     Status: None   Collection Time: 07/01/21  3:16 PM   Specimen: Nasopharyngeal Swab; Nasopharyngeal(NP) swabs in vial transport medium  Result Value Ref Range Status   SARS Coronavirus 2 by RT PCR NEGATIVE NEGATIVE Final    Comment: (NOTE) SARS-CoV-2 target nucleic acids are NOT DETECTED.  The SARS-CoV-2 RNA is generally detectable in upper respiratory specimens during the acute phase of infection. The lowest concentration of SARS-CoV-2 viral copies this assay can detect is 138 copies/mL. A negative result does not preclude SARS-Cov-2 infection and should not be used as the sole basis  for treatment or other patient management decisions. A negative result may occur with  improper specimen collection/handling, submission of specimen other than nasopharyngeal swab, presence of viral mutation(s) within the areas targeted by this assay, and inadequate number of viral copies(<138 copies/mL). A negative result must be combined with clinical observations, patient history, and epidemiological information. The expected result is Negative.  Fact Sheet for Patients:  EntrepreneurPulse.com.au  Fact Sheet for Healthcare Providers:  IncredibleEmployment.be  This test is no t yet approved or cleared by the [place] FDA and  has been authorized for detection and/or diagnosis of SARS-CoV-2 by FDA under an Emergency Use Authorization (EUA). This EUA will remain  in effect (meaning this test can be used) for the duration of the COVID-19 declaration under Section 564(b)(1) of the Act, 21 U.S.C.section 360bbb-3(b)(1), unless the authorization is terminated  or revoked sooner.       Influenza A by PCR NEGATIVE NEGATIVE Final   Influenza B by PCR NEGATIVE NEGATIVE Final    Comment: (NOTE) The Xpert Xpress SARS-CoV-2/FLU/RSV plus assay is intended as an aid in the diagnosis of influenza from Nasopharyngeal swab specimens and should not be used as a sole basis for treatment. Nasal washings and aspirates are unacceptable for Xpert Xpress SARS-CoV-2/FLU/RSV testing.  Fact Sheet for Patients: EntrepreneurPulse.com.au  Fact Sheet for Healthcare Providers: IncredibleEmployment.be  This test is not yet approved or cleared by the [place] FDA and has been authorized for detection and/or diagnosis of SARS-CoV-2 by FDA under an Emergency Use Authorization (EUA). This EUA will remain in effect (meaning this test can be used) for the duration of the COVID-19 declaration under Section 564(b)(1) of the Act, 21  U.S.C. section 360bbb-3(b)(1), unless the authorization is terminated or revoked.  Performed at [facility], [address] [place], [place] [address]      Labs: BNP (last 3 results) Recent Labs    07/01/21 1507  BNP 123XX123*   Basic Metabolic Panel: Recent Labs  Lab 07/01/21 1507 07/02/21 0633  NA 144 145  K 3.7 3.4*  CL 109 109  CO2 29 30  GLUCOSE 127* 109*  BUN 24* 23  CREATININE 1.49* 1.46*  CALCIUM 8.6* 8.0*  MG 2.3  --    Liver Function Tests: Recent Labs  Lab 07/01/21 1507  AST 21  ALT 18  ALKPHOS 76  BILITOT 1.3*  PROT 6.8  ALBUMIN 3.4*   No results for input(s): LIPASE, AMYLASE in the last 168 hours. No results for input(s): AMMONIA in the last 168 hours. CBC: Recent Labs  Lab 07/01/21 1507 07/02/21 0633  WBC 10.5 7.4  NEUTROABS 7.7 5.0  HGB 10.1* 8.3*  HCT 31.9* 26.3*  MCV 91.4 90.7  PLT 225 190   Cardiac Enzymes: No results for input(s): CKTOTAL, CKMB, CKMBINDEX, TROPONINI in the last 168 hours. BNP: Invalid input(s): POCBNP CBG: Recent Labs  Lab 07/01/21 1839 07/01/21 2144  GLUCAP 145* 115*   D-Dimer No results for input(s): DDIMER in the last 72 hours. Hgb A1c No results for input(s): HGBA1C in the last 72 hours. Lipid Profile No results for input(s): CHOL, HDL, LDLCALC, TRIG, CHOLHDL, LDLDIRECT in the last 72 hours. Thyroid function studies Recent Labs    07/01/21 1507  TSH 3.842   Anemia work up No results for input(s): VITAMINB12, FOLATE, FERRITIN, TIBC, IRON, RETICCTPCT in the last 72 hours. Urinalysis No results found for: COLORURINE, APPEARANCEUR, [place], [place], GLUCOSEU, [place], [place], [place], PROTEINUR, UROBILINOGEN, NITRITE, LEUKOCYTESUR Sepsis Labs Invalid input(s): PROCALCITONIN,  WBC,  LACTICIDVEN Microbiology Recent Results (from the past 240 hour(s))  Resp Panel by RT-PCR (Flu A&B, Covid) Nasopharyngeal Swab     Status: None   Collection Time: 07/01/21  3:16 PM   Specimen:  Nasopharyngeal Swab; Nasopharyngeal(NP) swabs in vial transport medium  Result Value Ref Range Status   SARS Coronavirus 2 by RT PCR NEGATIVE NEGATIVE Final    Comment: (NOTE) SARS-CoV-2 target nucleic acids are NOT DETECTED.  The SARS-CoV-2 RNA is generally detectable in upper respiratory specimens during the acute phase of infection. The lowest concentration of SARS-CoV-2 viral copies this assay can detect is 138 copies/mL. A negative result does not preclude SARS-Cov-2 infection and should not be used as the sole basis for treatment or other patient management decisions. A negative result may occur with  improper specimen collection/handling, submission of specimen other than nasopharyngeal swab, presence of viral mutation(s) within the areas targeted by this assay, and inadequate number of viral copies(<138 copies/mL). A negative result must be combined with clinical observations, patient history, and epidemiological information. The expected result is Negative.  Fact Sheet for Patients:  EntrepreneurPulse.com.au  Fact Sheet for Healthcare Providers:  IncredibleEmployment.be  This test is no t yet approved or cleared by the [place] FDA and  has been authorized for detection and/or diagnosis of SARS-CoV-2 by FDA under an Emergency Use Authorization (EUA). This EUA will remain  in effect (meaning this test can be used) for the duration of the COVID-19 declaration under Section 564(b)(1) of the Act, 21 U.S.C.section 360bbb-3(b)(1), unless the authorization is terminated  or revoked sooner.       Influenza A by PCR NEGATIVE NEGATIVE Final   Influenza B by PCR NEGATIVE NEGATIVE Final    Comment: (NOTE) The Xpert Xpress SARS-CoV-2/FLU/RSV plus assay is intended as an aid in the diagnosis of influenza from Nasopharyngeal swab specimens and should not be used as a sole basis for treatment. Nasal washings and aspirates are unacceptable for  Xpert Xpress SARS-CoV-2/FLU/RSV testing.  Fact Sheet for Patients: EntrepreneurPulse.com.au  Fact Sheet for Healthcare Providers: IncredibleEmployment.be  This test is not yet approved or cleared by the [place] FDA and has been authorized for detection and/or diagnosis of SARS-CoV-2 by FDA under an Emergency Use Authorization (EUA). This EUA will remain in effect (meaning this test can be used) for the duration of the COVID-19 declaration under Section 564(b)(1) of the Act, 21 U.S.C. section 360bbb-3(b)(1), unless the authorization is terminated or revoked.  Performed at [facility], [address] [place], [place] [address]     Time coordinating discharge: Over 30 minutes  SIGNED:  Lorella Nimrod, MD  [facility] 07/02/2021, 2:13 PM  If 7PM-7AM, please contact night-coverage [email or web address]  This record has been created using Systems analyst. Errors have been sought and corrected,but may not always be located. Such creation errors do not reflect on the standard of care.

## 2021-07-04 LAB — RESPIRATORY PANEL BY PCR

## 2021-07-09 NOTE — Progress Notes (Signed)
Patient ID: [name] [name], female    DOB: 11-Oct-1925, 87 y.o.   MRN: [id number]  HPI  Ms Query is a 87 y/o female with a history of DM, hyperlipidemia, HTN, CKD, anemia, dementia, GIB, lymphedema, obstructive sleep apnea, osteoarthritis and chronic heart failure.   Echo report from 07/02/21 reviewed and showed an EF of 50-55% along with mild LAE.   Admitted 07/01/21 due to shortness of breath & cough. Home torsemide had been held during previous admission due to AKI with plans to resume but patient did not resume it. Initially given IV lasix with transition to oral diuretics. Discharged the following day.   She presents today for her initial visit with a chief complaint of minimal fatigue upon moderate exertion. This has been present for several months. She has associated cough and chronic lymphedema along with this. She denies any shortness of breath, chest pain, palpitations, abdominal distention, difficulty sleeping, dizziness or weight gain.   Has compression boots that she wears consistently once a day and sometimes twice a day. Has not taken her torsemide yet today.   Granddaughter doesn't add salt to her food but patient does like some salty foods like salmon cakes (made from canned salmon)  Past Medical History:  Diagnosis Date   Anemia    CHF (congestive heart failure) (HCC)    Chronic kidney disease    Dementia (HCC)    Diabetes mellitus without complication (HCC)    GIB (gastrointestinal bleeding)    Hyperlipidemia    Hypertension    Lymphedema    OSA on CPAP    Osteoarthritis    Vitamin D deficiency    Past Surgical History:  Procedure Laterality Date   ABDOMINAL HYSTERECTOMY     History reviewed. No pertinent family history. Social History   Tobacco Use   Smoking status: Never   Smokeless tobacco: Never  Substance Use Topics   Alcohol use: Never   Allergies  Allergen Reactions   Codeine Swelling   Lisinopril Swelling    Other reaction(s):  Other angioedema    Prednisolone     Other reaction(s): Other Causes increased blood pressure   Prior to Admission medications   Medication Sig Start Date End Date Taking? Authorizing Provider  acetaminophen (TYLENOL) 500 MG tablet Take 500 mg by mouth every 6 (six) hours as needed for pain. 01/04/21 01/04/22 Yes [provider]  amLODipine (NORVASC) 2.5 MG tablet Take 7.5 mg by mouth daily. 04/15/21  Yes [provider]  atorvastatin (LIPITOR) 40 MG tablet Take 40 mg by mouth at bedtime. 04/19/21  Yes [provider]  cholecalciferol (VITAMIN D3) 25 MCG (1000 UNIT) tablet Take 1,000 Units by mouth daily.   Yes [provider]  Ferrous Fumarate-Vitamin C ER (FERRO-SEQUELS) 65-25 MG TBCR Take 1 tablet by mouth at bedtime.   Yes [provider]  isosorbide mononitrate (IMDUR) 30 MG 24 hr tablet Take 30 mg by mouth daily. 05/03/21  Yes [provider]  torsemide (DEMADEX) 20 MG tablet Take 1 tablet (20 mg total) by mouth daily. 07/03/21  Yes Lorella Nimrod, MD  vitamin B-12 (CYANOCOBALAMIN) 1000 MCG tablet Take 1,000 mcg by mouth daily.   Yes [provider]  cloNIDine (CATAPRES) 0.2 MG tablet Take 1 tablet (0.2 mg total) by mouth 2 (two) times daily. 07/10/21   Alisa Graff, FNP    Review of Systems  Constitutional:  Positive for fatigue. Negative for appetite change.  HENT:  Negative for congestion, postnasal drip and sore  throat.   Eyes: Negative.   Respiratory:  Positive for cough. Negative for chest tightness and shortness of breath.   Cardiovascular:  Positive for leg swelling. Negative for chest pain and palpitations.  Gastrointestinal:  Negative for abdominal distention and abdominal pain.  Endocrine: Negative.   Genitourinary: Negative.   Musculoskeletal:  Negative for back pain and neck pain.  Skin: Negative.   Allergic/Immunologic: Negative.   Neurological:  Negative for dizziness and light-headedness.  Hematological:   Negative for adenopathy. Does not bruise/bleed easily.  Psychiatric/Behavioral:  Negative for dysphoric mood and sleep disturbance. The patient is not nervous/anxious.    Vitals:   07/10/21 1254  BP: (!) 177/68  Pulse: 69  Resp: 16  SpO2: 100%  Weight: 173 lb (78.5 kg)  Height: 5' (1.524 m)   Wt Readings from Last 3 Encounters:  07/10/21 173 lb (78.5 kg)  07/02/21 174 lb 13.2 oz (79.3 kg)   Lab Results  Component Value Date   CREATININE 1.46 (H) 07/02/2021   CREATININE 1.49 (H) 07/01/2021   Physical Exam Vitals and nursing note reviewed. Exam conducted with a chaperone present (granddaughter).  Constitutional:      Appearance: Normal appearance.  HENT:     Head: Normocephalic and atraumatic.  Cardiovascular:     Rate and Rhythm: Normal rate and regular rhythm.  Pulmonary:     Effort: Pulmonary effort is normal. No respiratory distress.     Breath sounds: No wheezing or rales.  Abdominal:     General: There is no distension.     Palpations: Abdomen is soft.  Musculoskeletal:        General: No tenderness.     Cervical back: Normal range of motion and neck supple.     Right lower leg: Edema present.     Left lower leg: Edema present.  Skin:    General: Skin is warm and dry.  Neurological:     General: No focal deficit present.     Mental Status: She is alert and oriented to person, place, and time.  Psychiatric:        Mood and Affect: Mood normal.        Behavior: Behavior normal.        Thought Content: Thought content normal.   Assessment & Plan:  1: Chronic heart failure with preserved ejection fraction with structural changes (LAE)- - NYHA class II - euvolemic today - weighing daily; reminded to call for an overnight weight gain of > 2 pounds or a weekly weight gain of > 5 pounds - not adding salt to her food but granddaughter says that patient is a picky eater and likes salmon cakes made from canned salmon; she is trying to balance this with just letting  her at 87 y/o, enjoy her food; low sodium cookbook provided - has had angioedema with lisinopril so not a candidate for entresto - BNP 07/01/21 was 1332.5  2: HTN- - BP elevated but she hasn't taken her torsemide  yet today - saw PCP ([name]) 06/12/21; returns 07/17/21 - BMP 07/02/21 reviewed and showed sodium 145, potassium 3.4, creatinine 1.46 and GFR 33 - can divide her amlodipine dose up and see if splitting the dose provides better HTN control  3: Sleep apnea- - wearing CPAP nightly  4: Lymphedema- - stage 2 - elevates her legs some during the day and then at night in the bed - limited in her ability to exercise due to the lymphedema - unable to wear compression socks -  has compression boots that she does wear daily; encouraged her to try and get them on twice daily - receiving PT/OT weekly   Medication list reviewed.   Granddaughter says that it's quite difficult and taxing to get patient out to her appointments. Granddaughter would prefer not to make another appointment at this time. Advised her that she could call back at anytime to schedule another appointment and she was comfortable with this plan.

## 2021-07-10 ENCOUNTER — Ambulatory Visit: Payer: Medicare PPO | Attending: Family | Admitting: Family

## 2021-07-10 ENCOUNTER — Other Ambulatory Visit: Payer: Self-pay

## 2021-07-10 ENCOUNTER — Encounter: Payer: Self-pay | Admitting: Family

## 2021-07-10 VITALS — BP 177/68 | HR 69 | Resp 16 | Ht 60.0 in | Wt 173.0 lb

## 2021-07-10 DIAGNOSIS — R5383 Other fatigue: Secondary | ICD-10-CM | POA: Insufficient documentation

## 2021-07-10 DIAGNOSIS — I10 Essential (primary) hypertension: Secondary | ICD-10-CM

## 2021-07-10 DIAGNOSIS — I890 Lymphedema, not elsewhere classified: Secondary | ICD-10-CM | POA: Diagnosis not present

## 2021-07-10 DIAGNOSIS — Z7901 Long term (current) use of anticoagulants: Secondary | ICD-10-CM | POA: Insufficient documentation

## 2021-07-10 DIAGNOSIS — Z9989 Dependence on other enabling machines and devices: Secondary | ICD-10-CM

## 2021-07-10 DIAGNOSIS — I5032 Chronic diastolic (congestive) heart failure: Secondary | ICD-10-CM

## 2021-07-10 DIAGNOSIS — G4733 Obstructive sleep apnea (adult) (pediatric): Secondary | ICD-10-CM | POA: Diagnosis not present

## 2021-07-10 DIAGNOSIS — Z79899 Other long term (current) drug therapy: Secondary | ICD-10-CM | POA: Diagnosis not present

## 2021-07-10 DIAGNOSIS — E785 Hyperlipidemia, unspecified: Secondary | ICD-10-CM | POA: Insufficient documentation

## 2021-07-10 DIAGNOSIS — E1122 Type 2 diabetes mellitus with diabetic chronic kidney disease: Secondary | ICD-10-CM | POA: Insufficient documentation

## 2021-07-10 DIAGNOSIS — Z888 Allergy status to other drugs, medicaments and biological substances status: Secondary | ICD-10-CM | POA: Diagnosis not present

## 2021-07-10 DIAGNOSIS — R059 Cough, unspecified: Secondary | ICD-10-CM | POA: Insufficient documentation

## 2021-07-10 DIAGNOSIS — I130 Hypertensive heart and chronic kidney disease with heart failure and stage 1 through stage 4 chronic kidney disease, or unspecified chronic kidney disease: Secondary | ICD-10-CM | POA: Insufficient documentation

## 2021-07-10 DIAGNOSIS — F0390 Unspecified dementia without behavioral disturbance: Secondary | ICD-10-CM | POA: Insufficient documentation

## 2021-07-10 DIAGNOSIS — N189 Chronic kidney disease, unspecified: Secondary | ICD-10-CM | POA: Insufficient documentation

## 2021-07-10 MED ORDER — CLONIDINE HCL 0.2 MG PO TABS: 0.2000 mg | ORAL_TABLET | Freq: Two times a day (BID) | ORAL | 3 refills | Status: DC

## 2021-07-10 NOTE — Patient Instructions (Addendum)
Continue weighing daily and call for an overnight weight gain of > 2 pounds or a weekly weight gain of >5 pounds.    Split the amlodipine dose to 2 tablets in the morning and 1 tablet in the evening   Call us in the future if you need [place] for anything

## 2021-07-12 ENCOUNTER — Encounter: Payer: Self-pay | Admitting: Oncology

## 2021-07-12 ENCOUNTER — Other Ambulatory Visit: Payer: Self-pay

## 2021-07-12 ENCOUNTER — Inpatient Hospital Stay: Payer: Medicare PPO

## 2021-07-12 ENCOUNTER — Inpatient Hospital Stay: Payer: Medicare PPO | Attending: Oncology | Admitting: Oncology

## 2021-07-12 VITALS — BP 165/78 | HR 69 | Temp 97.5°F | Resp 18 | Wt 173.0 lb

## 2021-07-12 DIAGNOSIS — D631 Anemia in chronic kidney disease: Secondary | ICD-10-CM | POA: Diagnosis not present

## 2021-07-12 DIAGNOSIS — N184 Chronic kidney disease, stage 4 (severe): Secondary | ICD-10-CM | POA: Insufficient documentation

## 2021-07-12 DIAGNOSIS — I130 Hypertensive heart and chronic kidney disease with heart failure and stage 1 through stage 4 chronic kidney disease, or unspecified chronic kidney disease: Secondary | ICD-10-CM | POA: Insufficient documentation

## 2021-07-12 DIAGNOSIS — E1122 Type 2 diabetes mellitus with diabetic chronic kidney disease: Secondary | ICD-10-CM | POA: Diagnosis not present

## 2021-07-12 DIAGNOSIS — Z801 Family history of malignant neoplasm of trachea, bronchus and lung: Secondary | ICD-10-CM | POA: Insufficient documentation

## 2021-07-12 NOTE — Progress Notes (Signed)
Patient here to establish care  

## 2021-07-12 NOTE — Progress Notes (Signed)
Hematology/Oncology Consult note [facility] [phone] [phone]   Patient Care Team: [name], [name], MD as PCP - General (Internal Medicine)  REFERRING PROVIDER: Anthonette Legato, MD  CHIEF COMPLAINTS/REASON FOR VISIT:  Evaluation of anemia in chronic kidney disease  HISTORY OF PRESENTING ILLNESS:   [name] [name] is a  87 y.o.  female with PMH listed below was seen in consultation at the request of  Holley Raring, Munsoor, MD  for evaluation of anemia and chronic kidney disease.  Patient has a history of diabetes type 2, chronic kidney disease stage IV, hypertension. For chronic kidney disease, patient follows up with Dr. Zollie Scale.  CKD was felt to be secondary to diabetes and hypertension. Most recent 06/12/2021 hemoglobin was 9.3. Iron saturation 14, transfer to 182, TIBC 254. Patient was accompanied by her daughter.  Per patient and daughter, patient has had a blood work done at Dr. Elwyn Lade office.  Patient has poor IV access and her daughter hopes that patient does not need to have blood drawn again today. Patient is a poor historian.  Review of Systems  Constitutional:  Positive for fatigue. Negative for appetite change, chills and fever.  HENT:   Negative for hearing loss and voice change.   Eyes:  Negative for eye problems.  Respiratory:  Negative for chest tightness and cough.   Cardiovascular:  Positive for leg swelling. Negative for chest pain.  Gastrointestinal:  Negative for abdominal distention, abdominal pain and blood in stool.  Endocrine: Negative for hot flashes.  Genitourinary:  Negative for difficulty urinating and frequency.   Musculoskeletal:  Negative for arthralgias.  Skin:  Negative for itching and rash.  Neurological:  Negative for extremity weakness.  Hematological:  Negative for adenopathy.  Psychiatric/Behavioral:  Negative for confusion.    MEDICAL HISTORY:  Past Medical History:  Diagnosis Date   Anemia     CHF (congestive heart failure) (HCC)    Chronic kidney disease    Dementia (HCC)    Diabetes mellitus without complication (HCC)    GIB (gastrointestinal bleeding)    Hyperlipidemia    Hypertension    Lymphedema    OSA on CPAP    Osteoarthritis    Vitamin D deficiency     SURGICAL HISTORY: Past Surgical History:  Procedure Laterality Date   ABDOMINAL HYSTERECTOMY      SOCIAL HISTORY: Social History   Socioeconomic History   Marital status: Widowed    Spouse name: Not on file   Number of children: Not on file   Years of education: Not on file   Highest education level: Not on file  Occupational History   Not on file  Tobacco Use   Smoking status: Never   Smokeless tobacco: Never  Vaping Use   Vaping Use: Never used  Substance and Sexual Activity   Alcohol use: Never   Drug use: Never   Sexual activity: Not Currently  Other Topics Concern   Not on file  Social History Narrative   Not on file   Social Determinants of Health   Financial Resource Strain: Not on file  Food Insecurity: Not on file  Transportation Needs: Not on file  Physical Activity: Not on file  Stress: Not on file  Social Connections: Not on file  Intimate Partner Violence: Not on file    FAMILY HISTORY: Family History  Problem Relation Age of Onset   Kidney disease Mother    Congestive Heart Failure Mother    Lung cancer Daughter  ALLERGIES:  is allergic to codeine, lisinopril, and prednisolone.  MEDICATIONS:  Current Outpatient Medications  Medication Sig Dispense Refill   acetaminophen (TYLENOL) 500 MG tablet Take 500 mg by mouth every 6 (six) hours as needed for pain.     amLODipine (NORVASC) 2.5 MG tablet Take 7.5 mg by mouth daily.     atorvastatin (LIPITOR) 40 MG tablet Take 40 mg by mouth at bedtime.     cholecalciferol (VITAMIN D3) 25 MCG (1000 UNIT) tablet Take 1,000 Units by mouth daily.     cloNIDine (CATAPRES) 0.2 MG tablet Take 1 tablet (0.2 mg total) by mouth 2  (two) times daily. 180 tablet 3   Ferrous Fumarate-Vitamin C ER (FERRO-SEQUELS) 65-25 MG TBCR Take 1 tablet by mouth at bedtime.     isosorbide mononitrate (IMDUR) 30 MG 24 hr tablet Take 30 mg by mouth daily.     torsemide (DEMADEX) 20 MG tablet Take 1 tablet (20 mg total) by mouth daily. 30 tablet 1   vitamin B-12 (CYANOCOBALAMIN) 1000 MCG tablet Take 1,000 mcg by mouth daily.     No current facility-administered medications for this visit.     PHYSICAL EXAMINATION: ECOG PERFORMANCE STATUS: 2 - Symptomatic, <50% confined to bed Vitals:   07/12/21 1503  BP: (!) 165/78  Pulse: 69  Resp: 18  Temp: (!) 97.5 F (36.4 C)   Filed Weights   07/12/21 1503  Weight: 173 lb (78.5 kg)    Physical Exam Constitutional:      General: She is not in acute distress.    Comments: Patient sits in the wheelchair. Frail appearance elderly female  HENT:     Head: Normocephalic and atraumatic.  Eyes:     General: No scleral icterus. Cardiovascular:     Rate and Rhythm: Normal rate and regular rhythm.     Heart sounds: Normal heart sounds.  Pulmonary:     Effort: Pulmonary effort is normal. No respiratory distress.     Breath sounds: No wheezing.  Abdominal:     General: Bowel sounds are normal. There is no distension.     Palpations: Abdomen is soft.  Musculoskeletal:        General: No deformity. Normal range of motion.     Cervical back: Normal range of motion and neck supple.  Skin:    General: Skin is warm and dry.     Findings: No erythema or rash.  Neurological:     Mental Status: She is alert. Mental status is at baseline.     Cranial Nerves: No cranial nerve deficit.     Coordination: Coordination normal.  Psychiatric:        Mood and Affect: Mood normal.    LABORATORY DATA:  I have reviewed the data as listed Lab Results  Component Value Date   WBC 7.4 07/02/2021   HGB 8.3 (L) 07/02/2021   HCT 26.3 (L) 07/02/2021   MCV 90.7 07/02/2021   PLT 190 07/02/2021   Recent  Labs    07/01/21 1507 07/02/21 0633  NA 144 145  K 3.7 3.4*  CL 109 109  CO2 29 30  GLUCOSE 127* 109*  BUN 24* 23  CREATININE 1.49* 1.46*  CALCIUM 8.6* 8.0*  GFRNONAA 32* 33*  PROT 6.8  --   ALBUMIN 3.4*  --   AST 21  --   ALT 18  --   ALKPHOS 76  --   BILITOT 1.3*  --    Iron/TIBC/Ferritin/ %Sat No results found for: IRON,  TIBC, FERRITIN, IRONPCTSAT    RADIOGRAPHIC STUDIES: I have personally reviewed the radiological images as listed and agreed with the findings in the report. DG Chest 2 View  Result Date: 07/02/2021 CLINICAL DATA:  Shortness of breath EXAM: CHEST - 2 VIEW COMPARISON:  07/01/2021. FINDINGS: Redemonstrated enlarged cardiac silhouette. Calcified 5 atherosclerotic disease and aortic tortuosity. Elevation of the right hemidiaphragm. Pulmonary vascular congestion has improved. Redemonstrated right hilar focal opacity, slightly decreased in size from a which may still represent a mass versus vascular structure. Possible small left pleural effusion or pleural thickening. No pneumothorax. No acute osseous abnormality. IMPRESSION: Redemonstrated focal opacity in the right hilar region, which may represent a mass versus enlarged vascular structures, although previously noted pulmonary vascular congestion has improved. Consider CT for further evaluation. Electronically Signed   By: Merilyn Baba M.D.   On: 07/02/2021 15:30   DG Chest Portable 1 View  Result Date: 07/01/2021 CLINICAL DATA:  Increased shortness of breath. EXAM: PORTABLE CHEST 1 VIEW COMPARISON:  None. FINDINGS: The cardiac silhouette is enlarged. Calcific atherosclerotic disease and tortuosity of the aorta. Eventration of the right hemidiaphragm. Pulmonary vascular congestion. Pulmonary mass versus superimposed enlarged vascular structures are seen in the right hilum. There is no evidence of focal airspace consolidation, pleural effusion or pneumothorax. Osseous structures are without acute abnormality. Soft  tissues are grossly normal. IMPRESSION: 1. Pulmonary vascular congestion. 2. Pulmonary mass versus superimposed enlarged vascular structures in the right hilum. Attention on future radiographs recommended. Electronically Signed   By: Fidela Salisbury M.D.   On: 07/01/2021 15:59   ECHOCARDIOGRAM COMPLETE  Result Date: 07/02/2021    ECHOCARDIOGRAM REPORT   Patient Name:   [name] [name] Date of Exam: 07/02/2021 Medical Rec #:  [id number]     Height:       63.0 in Accession #:    [id number]    Weight:       174.8 lb Date of Birth:  [birth year]     BSA:          1.826 m Patient Age:    8 years      BP:           173/67 mmHg Patient Gender: F             HR:           81 bpm. Exam Location:  [facility] Procedure: 2D Echo, Cardiac Doppler and Color Doppler Indications:     Dyspnea R06.00                  Elevated troponin  History:         Patient has no prior history of Echocardiogram examinations.                  Risk Factors:Hypertension. OSA on CPAP.  Sonographer:     Sherrie Sport Referring Phys:  [id number] AMY N COX Diagnosing Phys: Kathlyn Sacramento MD  Sonographer Comments: Suboptimal apical window. Apical views are off axis. IMPRESSIONS  1. Left ventricular ejection fraction, by estimation, is 50 to 55%. The left ventricle has low normal function. Left ventricular endocardial border not optimally defined to evaluate regional wall motion. The left ventricular internal cavity size was mildly dilated. Left ventricular diastolic parameters are consistent with Grade I diastolic dysfunction (impaired relaxation).  2. Right ventricular systolic function is normal. The right ventricular size is normal. Tricuspid regurgitation signal is inadequate for assessing PA pressure.  3. Left atrial size was mildly dilated.  4.  The mitral valve is normal in structure. No evidence of mitral valve regurgitation. No evidence of mitral stenosis.  5. The aortic valve is normal in structure. Aortic valve regurgitation is mild. Mild aortic valve  sclerosis is present, with no evidence of aortic valve stenosis.     Challenging image quality. FINDINGS  Left Ventricle: Left ventricular ejection fraction, by estimation, is 50 to 55%. The left ventricle has low normal function. Left ventricular endocardial border not optimally defined to evaluate regional wall motion. The left ventricular internal cavity  size was mildly dilated. There is no left ventricular hypertrophy. Left ventricular diastolic parameters are consistent with Grade I diastolic dysfunction (impaired relaxation). Right Ventricle: The right ventricular size is normal. No increase in right ventricular wall thickness. Right ventricular systolic function is normal. Tricuspid regurgitation signal is inadequate for assessing PA pressure. Left Atrium: Left atrial size was mildly dilated. Right Atrium: Right atrial size was normal in size. Pericardium: There is no evidence of pericardial effusion. Mitral Valve: The mitral valve is normal in structure. No evidence of mitral valve regurgitation. No evidence of mitral valve stenosis. Tricuspid Valve: The tricuspid valve is normal in structure. Tricuspid valve regurgitation is not demonstrated. No evidence of tricuspid stenosis. Aortic Valve: The aortic valve is normal in structure. Aortic valve regurgitation is mild. Mild aortic valve sclerosis is present, with no evidence of aortic valve stenosis. Aortic valve mean gradient measures 2.0 mmHg. Aortic valve peak gradient measures 4.5 mmHg. Aortic valve area, by VTI measures 3.11 cm. Pulmonic Valve: The pulmonic valve was normal in structure. Pulmonic valve regurgitation is trivial. No evidence of pulmonic stenosis. Aorta: The aortic root is normal in size and structure. Venous: The inferior vena cava was not well visualized. IAS/Shunts: No atrial level shunt detected by color flow Doppler.  LEFT VENTRICLE PLAX 2D LVIDd:         5.60 cm  Diastology LVIDs:         4.00 cm  LV e' medial:    4.24 cm/s LV PW:          1.00 cm  LV E/e' medial:  13.5 LV IVS:        0.85 cm  LV e' lateral:   5.66 cm/s LVOT diam:     2.00 cm  LV E/e' lateral: 10.1 LV SV:         57 LV SV Index:   31 LVOT Area:     3.14 cm  RIGHT VENTRICLE RV Basal diam:  3.80 cm RV S prime:     18.20 cm/s TAPSE (M-mode): 3.2 cm LEFT ATRIUM           Index       RIGHT ATRIUM           Index LA diam:      3.70 cm 2.03 cm/m  RA Area:     19.60 cm LA Vol (A2C): 32.3 ml 17.69 ml/m RA Volume:   56.20 ml  30.77 ml/m LA Vol (A4C): 39.3 ml 21.52 ml/m  AORTIC VALVE                   PULMONIC VALVE AV Area (Vmax):    2.28 cm    PV Vmax:        0.68 m/s AV Area (Vmean):   2.26 cm    PV Peak grad:   1.9 mmHg AV Area (VTI):     3.11 cm    RVOT Peak grad: 4 mmHg AV Vmax:  105.50 cm/s AV Vmean:          69.800 cm/s AV VTI:            0.184 m AV Peak Grad:      4.5 mmHg AV Mean Grad:      2.0 mmHg LVOT Vmax:         76.40 cm/s LVOT Vmean:        50.300 cm/s LVOT VTI:          0.182 m LVOT/AV VTI ratio: 0.99  AORTA Ao Root diam: 2.77 cm MITRAL VALVE               TRICUSPID VALVE MV Area (PHT): 4.06 cm    TR Peak grad:   16.2 mmHg MV Decel Time: 187 msec    TR Vmax:        201.00 cm/s MV E velocity: 57.40 cm/s MV A velocity: 84.00 cm/s  SHUNTS MV E/A ratio:  0.68        Systemic VTI:  0.18 m                            Systemic Diam: 2.00 cm Kathlyn Sacramento MD Electronically signed by Kathlyn Sacramento MD Signature Date/Time: 07/02/2021/3:27:03 PM    Final       ASSESSMENT & PLAN:  1. Anemia due to stage 4 chronic kidney disease ([place])    I reviewed previous labs and discussed with patient that anemia is most likely due to chronic kidney disease.  Hemoglobin is 9.3.  Iron panel shows borderline iron saturation.  Discussed that I recommend to further optimize patient's iron store prior to erythropoietin therapy if needed. Discussed about option of oral iron supplementation versus IV Venofer treatments.  Per daughter, patient has been taking Vitron-C daily for  the past 1 year.  Discussed about IV Venofer treatments side effects. Patient is undecided and will continue further discussed with family and update me by next week. Follow-up plan to be determined.   All questions were answered. The patient knows to call the clinic with any problems questions or concerns.   Anthonette Legato, MD    Thank you for this kind referral and the opportunity to participate in the care of this patient. A copy of today's note is routed to referring provider    Earlie Server, MD, PhD Hematology Oncology [place] at [facility]  07/12/2021

## 2021-07-19 ENCOUNTER — Other Ambulatory Visit: Payer: Self-pay

## 2021-07-19 ENCOUNTER — Encounter: Payer: Self-pay | Admitting: Family

## 2021-07-19 ENCOUNTER — Ambulatory Visit: Payer: Medicare PPO | Attending: Family | Admitting: Family

## 2021-07-19 VITALS — BP 185/82 | HR 81 | Resp 16 | Ht 60.0 in | Wt 170.0 lb

## 2021-07-19 DIAGNOSIS — I130 Hypertensive heart and chronic kidney disease with heart failure and stage 1 through stage 4 chronic kidney disease, or unspecified chronic kidney disease: Secondary | ICD-10-CM | POA: Insufficient documentation

## 2021-07-19 DIAGNOSIS — Z79899 Other long term (current) drug therapy: Secondary | ICD-10-CM | POA: Diagnosis not present

## 2021-07-19 DIAGNOSIS — I890 Lymphedema, not elsewhere classified: Secondary | ICD-10-CM | POA: Diagnosis not present

## 2021-07-19 DIAGNOSIS — F0390 Unspecified dementia without behavioral disturbance: Secondary | ICD-10-CM | POA: Insufficient documentation

## 2021-07-19 DIAGNOSIS — R5383 Other fatigue: Secondary | ICD-10-CM | POA: Insufficient documentation

## 2021-07-19 DIAGNOSIS — E785 Hyperlipidemia, unspecified: Secondary | ICD-10-CM | POA: Diagnosis not present

## 2021-07-19 DIAGNOSIS — Z9989 Dependence on other enabling machines and devices: Secondary | ICD-10-CM

## 2021-07-19 DIAGNOSIS — Z7901 Long term (current) use of anticoagulants: Secondary | ICD-10-CM | POA: Insufficient documentation

## 2021-07-19 DIAGNOSIS — I5032 Chronic diastolic (congestive) heart failure: Secondary | ICD-10-CM | POA: Diagnosis not present

## 2021-07-19 DIAGNOSIS — Z8249 Family history of ischemic heart disease and other diseases of the circulatory system: Secondary | ICD-10-CM | POA: Diagnosis not present

## 2021-07-19 DIAGNOSIS — E1122 Type 2 diabetes mellitus with diabetic chronic kidney disease: Secondary | ICD-10-CM | POA: Diagnosis not present

## 2021-07-19 DIAGNOSIS — N189 Chronic kidney disease, unspecified: Secondary | ICD-10-CM | POA: Diagnosis not present

## 2021-07-19 DIAGNOSIS — I10 Essential (primary) hypertension: Secondary | ICD-10-CM | POA: Diagnosis not present

## 2021-07-19 DIAGNOSIS — R059 Cough, unspecified: Secondary | ICD-10-CM | POA: Insufficient documentation

## 2021-07-19 DIAGNOSIS — Z888 Allergy status to other drugs, medicaments and biological substances status: Secondary | ICD-10-CM | POA: Insufficient documentation

## 2021-07-19 DIAGNOSIS — G4733 Obstructive sleep apnea (adult) (pediatric): Secondary | ICD-10-CM | POA: Diagnosis not present

## 2021-07-19 DIAGNOSIS — M1990 Unspecified osteoarthritis, unspecified site: Secondary | ICD-10-CM | POA: Diagnosis not present

## 2021-07-19 DIAGNOSIS — R062 Wheezing: Secondary | ICD-10-CM | POA: Insufficient documentation

## 2021-07-19 NOTE — Progress Notes (Signed)
Patient ID: [name] [name], female    DOB: 11-26-1924, 87 y.o.   MRN: [id number]  HPI  [name] [name] is a 87 y/o female with a history of DM, hyperlipidemia, HTN, CKD, anemia, dementia, GIB, lymphedema, obstructive sleep apnea, osteoarthritis and chronic heart failure.   Echo report from 07/02/21 reviewed and showed an EF of 50-55% along with mild LAE.   Admitted 07/01/21 due to shortness of breath & cough. Home torsemide had been held during previous admission due to AKI with plans to resume but patient did not resume it. Initially given IV lasix with transition to oral diuretics. Discharged the following day.   She presents today for an acute visit with a chief complaint of a dry cough over the last several days. She says that she saw her PCP a couple of days ago and was told to increase her torsemide to 2 tablets a day. She says that she didn't cough as much yesterday and her weight has declined 2 pounds these last couple of day. She has associated fatigue, intermittent wheezing and pedal edema (improving) along with this. She denies any difficulty sleeping, dizziness, abdominal distention, palpitations, chest pain or shortness of breath. Granddaughter does feel like patient pants at times. Right hand also remains swollen on the top of her right hand.   Past Medical History:  Diagnosis Date   Anemia    CHF (congestive heart failure) (HCC)    Chronic kidney disease    Dementia (HCC)    Diabetes mellitus without complication (HCC)    GIB (gastrointestinal bleeding)    Hyperlipidemia    Hypertension    Lymphedema    OSA on CPAP    Osteoarthritis    Vitamin D deficiency    Past Surgical History:  Procedure Laterality Date   ABDOMINAL HYSTERECTOMY     Family History  Problem Relation Age of Onset   Kidney disease Mother    Congestive Heart Failure Mother    Lung cancer Daughter    Social History   Tobacco Use   Smoking status: Never   Smokeless tobacco: Never  Substance Use Topics    Alcohol use: Never   Allergies  Allergen Reactions   Codeine Swelling   Lisinopril Swelling    Other reaction(s): Other angioedema    Prednisolone     Other reaction(s): Other Causes increased blood pressure   Prior to Admission medications   Medication Sig Start Date End Date Taking? Authorizing Provider  acetaminophen (TYLENOL) 500 MG tablet Take 500 mg by mouth every 6 (six) hours as needed for pain. 01/04/21 01/04/22 Yes [provider]  amLODipine (NORVASC) 2.5 MG tablet Take 7.5 mg by mouth daily. 04/15/21  Yes [provider]  atorvastatin (LIPITOR) 40 MG tablet Take 40 mg by mouth at bedtime. 04/19/21  Yes [provider]  cholecalciferol (VITAMIN D3) 25 MCG (1000 UNIT) tablet Take 1,000 Units by mouth daily.   Yes [provider]  cloNIDine (CATAPRES) 0.2 MG tablet Take 1 tablet (0.2 mg total) by mouth 2 (two) times daily. 07/10/21  Yes Graylen Noboa, Otila Kluver A, FNP  Ferrous Fumarate-Vitamin C ER (FERRO-SEQUELS) 65-25 MG TBCR Take 1 tablet by mouth at bedtime.   Yes [provider]  isosorbide mononitrate (IMDUR) 30 MG 24 hr tablet Take 30 mg by mouth daily. 05/03/21  Yes [provider]  torsemide (DEMADEX) 20 MG tablet Take 2 tablets (40 mg total) by mouth daily. 07/03/21  Yes Lorella Nimrod, MD  vitamin B-12 (CYANOCOBALAMIN) 1000 MCG  tablet Take 1,000 mcg by mouth daily.   Yes [provider]    Review of Systems  Constitutional:  Positive for fatigue. Negative for appetite change.  HENT:  Negative for congestion, postnasal drip and sore throat.   Eyes: Negative.   Respiratory:  Positive for cough and wheezing. Negative for chest tightness and shortness of breath.   Cardiovascular:  Positive for leg swelling. Negative for chest pain and palpitations.  Gastrointestinal:  Negative for abdominal distention and abdominal pain.  Endocrine: Negative.   Genitourinary: Negative.   Musculoskeletal:  Negative for back pain and neck  pain.  Skin: Negative.   Allergic/Immunologic: Negative.   Neurological:  Negative for dizziness and light-headedness.  Hematological:  Negative for adenopathy. Does not bruise/bleed easily.  Psychiatric/Behavioral:  Negative for dysphoric mood and sleep disturbance. The patient is not nervous/anxious.    Vitals:   07/19/21 1220  BP: (!) 185/82  Pulse: 81  Resp: 16  SpO2: 99%  Weight: 170 lb (77.1 kg)  Height: 5' (1.524 m)   Wt Readings from Last 3 Encounters:  07/19/21 170 lb (77.1 kg)  07/12/21 173 lb (78.5 kg)  07/10/21 173 lb (78.5 kg)   Lab Results  Component Value Date   CREATININE 1.46 (H) 07/02/2021   CREATININE 1.49 (H) 07/01/2021   Physical Exam Vitals and nursing note reviewed. Exam conducted with a chaperone present (granddaughter).  Constitutional:      Appearance: Normal appearance.  HENT:     Head: Normocephalic and atraumatic.  Cardiovascular:     Rate and Rhythm: Normal rate and regular rhythm.  Pulmonary:     Effort: Pulmonary effort is normal. No respiratory distress.     Breath sounds: Rales (RLL) present. No wheezing.  Abdominal:     General: There is no distension.     Palpations: Abdomen is soft.  Musculoskeletal:        General: No tenderness.     Cervical back: Normal range of motion and neck supple.     Right lower leg: Edema present.     Left lower leg: Edema present.  Skin:    General: Skin is warm and dry.  Neurological:     General: No focal deficit present.     Mental Status: She is alert and oriented to person, place, and time.  Psychiatric:        Mood and Affect: Mood normal.        Behavior: Behavior normal.        Thought Content: Thought content normal.   Assessment & Plan:  1: Chronic heart failure with preserved ejection fraction with structural changes (LAE)- - NYHA class II - euvolemic today - weighing daily; reminded to call for an overnight weight gain of > 2 pounds or a weekly weight gain of > 5 pounds - weight  down 3 pounds from last visit here 9 days ago - due to rales in RLL and dry cough, advised them to continue the '40mg'$  torsemide over the next several days and to call us back early next week with update - concerned about giving metolazone as I don't want to dry her out too much and there's no need, today, for IV lasix - not adding salt to her food but granddaughter says that patient is a picky eater and likes salmon cakes made from canned salmon; she is trying to balance this with just letting her at 87 y/o, enjoy her food - has had angioedema with lisinopril so not a candidate  for entresto - BNP 07/17/21 was 1180.0  2: HTN- - BP elevated (185/82) but was normal at PCP office 2 days ago - saw PCP ([name]) 07/17/21 - BMP 07/17/21 reviewed and showed sodium 142, potassium 3.8, creatinine 1.55  - saw nephrology Holley Raring) 07/12/21  3: Sleep apnea- - wearing CPAP nightly  4: Lymphedema- - stage 2 - elevates her legs some during the day and then at night in the bed - limited in her ability to exercise due to the lymphedema - unable to wear compression socks - has compression boots that she does wear daily; encouraged her to try and get them on twice daily - receiving PT/OT weekly   Medication list reviewed.   Due to patient's difficulty in getting to appointments, granddaughter opts, again, to just call us if needed instead of making another appointment. She will call us early next week to update on patient's health.

## 2021-07-19 NOTE — Patient Instructions (Addendum)
Continue weighing daily and call for an overnight weight gain of > 2 pounds or a weekly weight gain of >5 pounds.    Call us on Tuesday to update

## 2021-07-25 ENCOUNTER — Telehealth: Payer: Self-pay | Admitting: Family

## 2021-07-25 NOTE — Telephone Encounter (Signed)
Received phone call from daughter regarding diuretic usage. She has been giving her 2 torsemide daily ('40mg'$  total) but now says that her weight has stabilized to 170-171 pounds and her lymphedema is also stable. She was wondering if it would be ok to reduce the dosage back down to '20mg'$  daily and then take an additional '20mg'$  if needed for weight gain or worsening edema.   Advised that she can certainly take the torsemide in that manner. She can call us back for any further issues.

## 2021-08-02 ENCOUNTER — Encounter (HOSPITAL_COMMUNITY): Payer: Self-pay | Admitting: Radiology

## 2021-09-18 ENCOUNTER — Ambulatory Visit (INDEPENDENT_AMBULATORY_CARE_PROVIDER_SITE_OTHER): Payer: Medicare PPO

## 2021-09-18 ENCOUNTER — Ambulatory Visit
Admission: EM | Admit: 2021-09-18 | Discharge: 2021-09-18 | Disposition: A | Discharge status: Other Acute Inpatient Hospital | Payer: Medicare PPO

## 2021-09-18 ENCOUNTER — Other Ambulatory Visit: Payer: Self-pay

## 2021-09-18 DIAGNOSIS — I5040 Unspecified combined systolic (congestive) and diastolic (congestive) heart failure: Secondary | ICD-10-CM

## 2021-09-18 DIAGNOSIS — I509 Heart failure, unspecified: Secondary | ICD-10-CM

## 2021-09-18 NOTE — ED Notes (Signed)
Patient is being discharged from the Urgent Care and sent to the Emergency Department via POV . Per Margarette Canada, NP, patient is in need of higher level of care due to Congestive Heart Failure. Patient is aware and verbalizes understanding of plan of care.  Vitals:   09/18/21 1746  BP: (!) 194/84  Pulse: 72  Resp: 18  Temp: 97.7 F (36.5 C)  SpO2: 96%

## 2021-09-18 NOTE — ED Triage Notes (Signed)
Pt c/o headache above left eye. Pt BP was 200/119 188/105 at home. Pt takes Clonodine and torsemide for her blood pressure.   Pt was evaluated by a home health nurse and was stated that she heard fluid in the bottom of her lungs. Pt was instructed by her Primary care to go to urgent care for an Xray due to possible fluid in her lungs.

## 2021-09-18 NOTE — Discharge Instructions (Addendum)
As we discussed your chest x-ray shows pulmonary edema consistent with exacerbation of CHF and there is also a increase in the right hilar opacity which may possibly be a mass versus adenopathy and needs a CT scan for further delineation.  Please go to [facility] for evaluation.

## 2021-09-18 NOTE — ED Provider Notes (Signed)
[facility]    CSN: [id number] Arrival date & time: 09/18/21  1639      History   Chief Complaint Chief Complaint  Patient presents with   Hypertension    HPI [name] [name] is a 87 y.o. female.   HPI  87 year old female here for evaluation of headache, elevated BP, and cough.  Patient is here with her family member who reports that the patient awoke at 2:30 in the morning with a complaint of headache.  The caregiver/family member took the patient's blood pressure and it was 200/118.  She gave her additional clonidine and reports that the patient had a resolution of the headache.  Patient is still denying any headache at this time.  Later in the morning she had evaluation by the home health nurse who stated that she heard fluid in the bases of her lungs and contacted the patient's primary care provider.  Per family/caregiver the PCP directed the home health nurse to bring the patient to urgent care for chest x-ray to evaluate for fluid in the lungs.  Patient states that she is having a nonproductive cough but she is denying any chest pain or shortness of breath.  She has not had any changes in vision, nausea, or vomiting.  She does have a history of lymphedema and she has had an increase in the edema in both lower extremities over the last week and a half.  Past Medical History:  Diagnosis Date   Anemia    CHF (congestive heart failure) (HCC)    Chronic kidney disease    Dementia (HCC)    Diabetes mellitus without complication ([place])    GIB (gastrointestinal bleeding)    Hyperlipidemia    Hypertension    Lymphedema    OSA on CPAP    Osteoarthritis    Vitamin D deficiency     Patient Active Problem List   Diagnosis Date Noted   SOB (shortness of breath)    Acute exacerbation of CHF (congestive heart failure) ([place]) 07/01/2021   CKD (chronic kidney disease), stage III ([place]) 07/01/2021   Essential hypertension 07/01/2021   Lymphedema 07/01/2021   OSA on CPAP  07/01/2021   GERD (gastroesophageal reflux disease) 07/01/2021   Insomnia 07/01/2021   Primary osteoarthritis 07/01/2021   Angioedema 07/01/2021    Past Surgical History:  Procedure Laterality Date   ABDOMINAL HYSTERECTOMY      OB History   No obstetric history on file.      Home Medications    Prior to Admission medications   Medication Sig Start Date End Date Taking? Authorizing Provider  acetaminophen (TYLENOL) 500 MG tablet Take 500 mg by mouth every 6 (six) hours as needed for pain. 01/04/21 01/04/22 Yes [provider]  amLODipine (NORVASC) 2.5 MG tablet Take 7.5 mg by mouth daily. 04/15/21  Yes [provider]  atorvastatin (LIPITOR) 40 MG tablet Take 40 mg by mouth at bedtime. 04/19/21  Yes [provider]  cholecalciferol (VITAMIN D3) 25 MCG (1000 UNIT) tablet Take 1,000 Units by mouth daily.   Yes [provider]  cloNIDine (CATAPRES) 0.2 MG tablet Take 1 tablet (0.2 mg total) by mouth 2 (two) times daily. 07/10/21  Yes Hackney, Otila Kluver A, FNP  Ferrous Fumarate-Vitamin C ER (FERRO-SEQUELS) 65-25 MG TBCR Take 1 tablet by mouth at bedtime.   Yes [provider]  isosorbide mononitrate (IMDUR) 30 MG 24 hr tablet Take 30 mg by mouth daily. 05/03/21  Yes [provider]  potassium chloride SA (  KLOR-CON M) 20 MEQ tablet Take 20 mEq by mouth 3 (three) times daily.   Yes [provider]  torsemide (DEMADEX) 20 MG tablet Take 1 tablet (20 mg total) by mouth daily. 07/03/21  Yes Lorella Nimrod, MD  vitamin B-12 (CYANOCOBALAMIN) 1000 MCG tablet Take 1,000 mcg by mouth daily.   Yes [provider]    Family History Family History  Problem Relation Age of Onset   Kidney disease Mother    Congestive Heart Failure Mother    Lung cancer Daughter     Social History Social History   Tobacco Use   Smoking status: Never   Smokeless tobacco: Never  Vaping Use   Vaping Use: Never used  Substance Use Topics   Alcohol  use: Never   Drug use: Never     Allergies   Codeine, Lisinopril, and Prednisolone   Review of Systems Review of Systems  Constitutional:  Negative for activity change, appetite change and fever.  Respiratory:  Positive for cough. Negative for shortness of breath.   Cardiovascular:  Positive for leg swelling. Negative for chest pain.  Neurological:  Positive for headaches. Negative for syncope and numbness.  Hematological: Negative.   Psychiatric/Behavioral: Negative.      Physical Exam Triage Vital Signs ED Triage Vitals  Enc Vitals Group     BP 09/18/21 1746 (!) 194/84     Pulse Rate 09/18/21 1746 72     Resp 09/18/21 1746 18     Temp 09/18/21 1746 97.7 F (36.5 C)     Temp Source 09/18/21 1746 Oral     SpO2 09/18/21 1746 96 %     Weight 09/18/21 1747 174 lb (78.9 kg)     Height 09/18/21 1747 5' (1.524 m)     Head Circumference --      Peak Flow --      Pain Score 09/18/21 1746 0     Pain Loc --      Pain Edu? --      Excl. in [place]? --    No data found.  Updated Vital Signs BP (!) 194/84 (BP Location: Left Arm)   Pulse 72   Temp 97.7 F (36.5 C) (Oral)   Resp 18   Ht 5' (1.524 m)   Wt 174 lb (78.9 kg)   SpO2 96%   BMI 33.98 kg/m   Visual Acuity Right Eye Distance:   Left Eye Distance:   Bilateral Distance:    Right Eye Near:   Left Eye Near:    Bilateral Near:     Physical Exam Vitals and nursing note reviewed.  Constitutional:      General: She is not in acute distress.    Appearance: Normal appearance. She is normal weight. She is not ill-appearing.  HENT:     Head: Normocephalic and atraumatic.  Cardiovascular:     Rate and Rhythm: Normal rate and regular rhythm.     Pulses: Normal pulses.     Heart sounds: Normal heart sounds. No murmur heard.   No gallop.  Pulmonary:     Effort: Pulmonary effort is normal.     Breath sounds: Rales present.  Musculoskeletal:        General: Swelling present.  Skin:    General: Skin is warm and dry.      Findings: No erythema.  Neurological:     General: No focal deficit present.     Mental Status: She is alert and oriented to person, place, and time.  Psychiatric:        Mood and Affect: Mood normal.        Behavior: Behavior normal.        Thought Content: Thought content normal.        Judgment: Judgment normal.     UC Treatments / Results  Labs (all labs ordered are listed, but only abnormal results are displayed) Labs Reviewed - No data to display  EKG   Radiology DG Chest 2 View  Result Date: 09/18/2021 CLINICAL DATA:  Headache above LEFT eye, hypertension, abnormal exam question fluid in lungs EXAM: CHEST - 2 VIEW COMPARISON:  07/02/2021 FINDINGS: Enlargement of cardiac silhouette with slight pulmonary vascular congestion. Atherosclerotic calcification aorta. Increased RIGHT hilar/infrahilar opacity, may represent progressive infiltrate but mass and adenopathy at the RIGHT hilum are not excluded. Mild accentuation of interstitial markings suggesting pulmonary edema. Minimal LEFT pleural effusion. Bones demineralized with chronic LEFT rotator cuff tear. No pneumothorax. IMPRESSION: Enlargement of cardiac silhouette with slight vascular congestion and question minimal pulmonary edema. Increased RIGHT hilar/infrahilar opacity since previous exam, cannot exclude mass/adenopathy; CT chest recommended for further assessment. Electronically Signed   By: Lavonia Dana M.D.   On: 09/18/2021 19:36    Procedures Procedures (including critical care time)  Medications Ordered in UC Medications - No data to display  Initial Impression / Assessment and Plan / UC Course  I have reviewed the triage vital signs and the nursing notes.  Pertinent labs & imaging results that were available during my care of the patient were reviewed by me and considered in my medical decision making (see chart for details).  Patient is a nontoxic-appearing 87 year old female here for evaluation of elevated  blood pressure, headache, and nonproductive cough as outlined in HPI above.  On physical exam patient has S1-S2 heart sounds that are free of murmur, rub, or ectopy.  Patient does have decreased lung sounds diffusely with fine crackles in bilateral bases.  Patient has normal work of breathing and she is able to speak in full sentences without any tachypnea or dyspnea.  Patient's pupils are equal round and reactive and her EOM is intact.  She is alert and oriented x3.  Patient's bilateral lower extremities are markedly edematous from her toes to her hips.  DP and PT pulses are 1+ bilateral.  Her lower extremities are warm but I am unable to determine capillary refill due to the gross amount of edema.  We will obtain chest x-ray to look for the presence of CHF.  Advised patient that if she has CHF on her chest x-ray she is going to the emergency department.  Chest x-ray independently reviewed and evaluated by me.  There appears to be a large effusion in the left lung base.  This was compared to chest x-ray from 07/02/2021.  Radiology overread is pending. Radiology impression is enlargement of cardiac silhouette with vascular congestion and questionable pulmonary edema.  Increased right hilar/infrahilar opacity since previous exam cannot exclude mass or adenopathy.  Due to the patient's findings of pulmonary edema I am recommending she go to the ER for evaluation of CHF.  She the family have elected to go to [facility] regional via [employer].  Patient left in stable condition via wheelchair.  Report called to Bing Neighbors at [facility] transfer center.   Final Clinical Impressions(s) / UC Diagnoses   Final diagnoses:  Acute on chronic congestive heart failure, unspecified heart failure type [facility])     Discharge Instructions      As  we discussed your chest x-ray shows pulmonary edema consistent with exacerbation of CHF and there is also a increase in the right hilar opacity which may possibly be a mass versus adenopathy and  needs a CT scan for further delineation.  Please go to [facility] for evaluation.     ED Prescriptions   None    PDMP not reviewed this encounter.   Margarette Canada, NP 09/18/21 2003

## 2021-09-25 ENCOUNTER — Other Ambulatory Visit: Payer: Self-pay

## 2021-09-25 ENCOUNTER — Ambulatory Visit
Admission: RE | Admit: 2021-09-25 | Discharge: 2021-09-25 | Disposition: A | Discharge status: Home / Self Care | Payer: Medicare PPO | Source: Ambulatory Visit | Attending: Nephrology | Admitting: Nephrology

## 2021-09-25 DIAGNOSIS — N189 Chronic kidney disease, unspecified: Secondary | ICD-10-CM | POA: Diagnosis present

## 2021-09-25 DIAGNOSIS — D631 Anemia in chronic kidney disease: Secondary | ICD-10-CM | POA: Insufficient documentation

## 2021-09-25 MED ORDER — FUROSEMIDE 10 MG/ML IJ SOLN: 80.0000 mg | Freq: Once | INTRAMUSCULAR | Status: AC

## 2021-09-25 MED ORDER — FUROSEMIDE 10 MG/ML IJ SOLN
INTRAMUSCULAR | Status: AC
  Administered 2021-09-25: 80 mg via INTRAVENOUS
  Filled 2021-09-25: qty 8

## 2021-09-25 MED ORDER — POTASSIUM CHLORIDE CRYS ER 20 MEQ PO TBCR: 20.0000 meq | EXTENDED_RELEASE_TABLET | Freq: Once | ORAL | Status: AC

## 2021-09-25 MED ORDER — POTASSIUM CHLORIDE CRYS ER 20 MEQ PO TBCR
EXTENDED_RELEASE_TABLET | ORAL | Status: AC
  Administered 2021-09-25: 20 meq via ORAL
  Filled 2021-09-25: qty 1

## 2021-09-27 ENCOUNTER — Ambulatory Visit: Payer: Medicare PPO

## 2021-09-28 ENCOUNTER — Ambulatory Visit
Admission: RE | Admit: 2021-09-28 | Discharge: 2021-09-28 | Disposition: A | Discharge status: Home / Self Care | Payer: Medicare PPO | Source: Ambulatory Visit | Attending: Nephrology | Admitting: Nephrology

## 2021-09-28 DIAGNOSIS — R609 Edema, unspecified: Secondary | ICD-10-CM | POA: Insufficient documentation

## 2021-09-28 MED ORDER — FUROSEMIDE 10 MG/ML IJ SOLN: 80.0000 mg | Freq: Once | INTRAMUSCULAR | Status: AC

## 2021-09-28 MED ORDER — FUROSEMIDE 10 MG/ML IJ SOLN
INTRAMUSCULAR | Status: AC
  Administered 2021-09-28: 80 mg via INTRAVENOUS
  Filled 2021-09-28: qty 8

## 2021-09-28 MED ORDER — POTASSIUM CHLORIDE CRYS ER 20 MEQ PO TBCR
EXTENDED_RELEASE_TABLET | ORAL | Status: AC
  Administered 2021-09-28: 20 meq via ORAL
  Filled 2021-09-28: qty 1

## 2021-09-28 MED ORDER — POTASSIUM CHLORIDE CRYS ER 20 MEQ PO TBCR: 20.0000 meq | EXTENDED_RELEASE_TABLET | Freq: Once | ORAL | Status: AC

## 2021-10-01 ENCOUNTER — Ambulatory Visit: Payer: Medicare PPO

## 2021-10-02 ENCOUNTER — Ambulatory Visit
Admission: RE | Admit: 2021-10-02 | Discharge: 2021-10-02 | Disposition: A | Discharge status: Home / Self Care | Payer: Medicare PPO | Source: Ambulatory Visit | Attending: Nephrology | Admitting: Nephrology

## 2021-10-02 ENCOUNTER — Other Ambulatory Visit: Payer: Self-pay

## 2021-10-02 DIAGNOSIS — R609 Edema, unspecified: Secondary | ICD-10-CM | POA: Diagnosis not present

## 2021-10-02 MED ORDER — POTASSIUM CHLORIDE CRYS ER 20 MEQ PO TBCR
20.0000 meq | EXTENDED_RELEASE_TABLET | Freq: Once | ORAL | Status: AC
  Administered 2021-10-02: 20 meq via ORAL

## 2021-10-02 MED ORDER — FUROSEMIDE 10 MG/ML IJ SOLN
80.0000 mg | Freq: Once | INTRAMUSCULAR | Status: AC
  Administered 2021-10-02: 80 mg via INTRAVENOUS

## 2021-10-03 ENCOUNTER — Ambulatory Visit: Payer: Medicare PPO

## 2021-10-23 ENCOUNTER — Other Ambulatory Visit: Payer: Self-pay

## 2021-10-23 ENCOUNTER — Emergency Department: Payer: Medicare PPO

## 2021-10-23 ENCOUNTER — Other Ambulatory Visit (INDEPENDENT_AMBULATORY_CARE_PROVIDER_SITE_OTHER): Payer: Self-pay | Admitting: Nurse Practitioner

## 2021-10-23 DIAGNOSIS — Z20822 Contact with and (suspected) exposure to covid-19: Secondary | ICD-10-CM | POA: Diagnosis not present

## 2021-10-23 DIAGNOSIS — N189 Chronic kidney disease, unspecified: Secondary | ICD-10-CM | POA: Diagnosis not present

## 2021-10-23 DIAGNOSIS — I130 Hypertensive heart and chronic kidney disease with heart failure and stage 1 through stage 4 chronic kidney disease, or unspecified chronic kidney disease: Secondary | ICD-10-CM | POA: Diagnosis not present

## 2021-10-23 DIAGNOSIS — E8770 Fluid overload, unspecified: Secondary | ICD-10-CM | POA: Diagnosis present

## 2021-10-23 DIAGNOSIS — I509 Heart failure, unspecified: Secondary | ICD-10-CM | POA: Insufficient documentation

## 2021-10-23 LAB — CBC
HCT: 38.1 % (ref 36.0–46.0)
Hemoglobin: 12 g/dL (ref 12.0–15.0)
MCH: 27 pg (ref 26.0–34.0)
MCHC: 31.5 g/dL (ref 30.0–36.0)
MCV: 85.6 fL (ref 80.0–100.0)
Platelets: 187 10*3/uL (ref 150–400)
RBC: 4.45 MIL/uL (ref 3.87–5.11)
RDW: 17.5 % — ABNORMAL HIGH (ref 11.5–15.5)
WBC: 7.1 10*3/uL (ref 4.0–10.5)
nRBC: 0 % (ref 0.0–0.2)

## 2021-10-23 LAB — COMPREHENSIVE METABOLIC PANEL
ALT: 31 U/L (ref 0–44)
AST: 38 U/L (ref 15–41)
Albumin: 3.6 g/dL (ref 3.5–5.0)
Alkaline Phosphatase: 64 U/L (ref 38–126)
Anion gap: 8 (ref 5–15)
BUN: 56 mg/dL — ABNORMAL HIGH (ref 8–23)
CO2: 26 mmol/L (ref 22–32)
Calcium: 9.3 mg/dL (ref 8.9–10.3)
Chloride: 108 mmol/L (ref 98–111)
Creatinine, Ser: 2.2 mg/dL — ABNORMAL HIGH (ref 0.44–1.00)
GFR, Estimated: 20 mL/min — ABNORMAL LOW (ref >60)
Glucose, Bld: 125 mg/dL — ABNORMAL HIGH (ref 70–99)
Potassium: 4.6 mmol/L (ref 3.5–5.1)
Sodium: 142 mmol/L (ref 135–145)
Total Bilirubin: 0.9 mg/dL (ref 0.3–1.2)
Total Protein: 7.8 g/dL (ref 6.5–8.1)

## 2021-10-23 LAB — BRAIN NATRIURETIC PEPTIDE: B Natriuretic Peptide: 488.9 pg/mL — ABNORMAL HIGH (ref 0.0–100.0)

## 2021-10-23 NOTE — ED Triage Notes (Signed)
Dr Holley Raring sent pt over to be admitted to be started on a lasix treatment, pt was seen 3 times last week for treatments, and states that it doesn't seem to be getting enough fluid off, pt is labored to breathe at this time, but denies feeling sob

## 2021-10-24 ENCOUNTER — Observation Stay
Admission: EM | Admit: 2021-10-24 | Discharge: 2021-10-25 | Disposition: A | Discharge status: Home / Self Care | Payer: Medicare PPO | Attending: Student in an Organized Health Care Education/Training Program | Admitting: Student in an Organized Health Care Education/Training Program

## 2021-10-24 ENCOUNTER — Encounter: Payer: Self-pay | Admitting: Internal Medicine

## 2021-10-24 DIAGNOSIS — D631 Anemia in chronic kidney disease: Secondary | ICD-10-CM | POA: Diagnosis present

## 2021-10-24 DIAGNOSIS — I351 Nonrheumatic aortic (valve) insufficiency: Secondary | ICD-10-CM | POA: Diagnosis present

## 2021-10-24 DIAGNOSIS — N179 Acute kidney failure, unspecified: Secondary | ICD-10-CM

## 2021-10-24 DIAGNOSIS — I5033 Acute on chronic diastolic (congestive) heart failure: Secondary | ICD-10-CM | POA: Diagnosis not present

## 2021-10-24 DIAGNOSIS — F0390 Unspecified dementia without behavioral disturbance: Secondary | ICD-10-CM | POA: Diagnosis present

## 2021-10-24 DIAGNOSIS — M1991 Primary osteoarthritis, unspecified site: Secondary | ICD-10-CM | POA: Diagnosis present

## 2021-10-24 DIAGNOSIS — I10 Essential (primary) hypertension: Secondary | ICD-10-CM | POA: Diagnosis present

## 2021-10-24 DIAGNOSIS — K551 Chronic vascular disorders of intestine: Secondary | ICD-10-CM | POA: Diagnosis present

## 2021-10-24 DIAGNOSIS — N189 Chronic kidney disease, unspecified: Secondary | ICD-10-CM | POA: Diagnosis present

## 2021-10-24 DIAGNOSIS — I890 Lymphedema, not elsewhere classified: Secondary | ICD-10-CM

## 2021-10-24 DIAGNOSIS — G4733 Obstructive sleep apnea (adult) (pediatric): Secondary | ICD-10-CM | POA: Diagnosis present

## 2021-10-24 DIAGNOSIS — N183 Chronic kidney disease, stage 3 unspecified: Secondary | ICD-10-CM | POA: Diagnosis present

## 2021-10-24 DIAGNOSIS — E785 Hyperlipidemia, unspecified: Secondary | ICD-10-CM | POA: Diagnosis present

## 2021-10-24 DIAGNOSIS — S81801A Unspecified open wound, right lower leg, initial encounter: Secondary | ICD-10-CM | POA: Diagnosis present

## 2021-10-24 DIAGNOSIS — R609 Edema, unspecified: Secondary | ICD-10-CM

## 2021-10-24 DIAGNOSIS — I509 Heart failure, unspecified: Secondary | ICD-10-CM

## 2021-10-24 DIAGNOSIS — I340 Nonrheumatic mitral (valve) insufficiency: Secondary | ICD-10-CM | POA: Diagnosis present

## 2021-10-24 DIAGNOSIS — M48062 Spinal stenosis, lumbar region with neurogenic claudication: Secondary | ICD-10-CM | POA: Diagnosis present

## 2021-10-24 DIAGNOSIS — E1121 Type 2 diabetes mellitus with diabetic nephropathy: Secondary | ICD-10-CM | POA: Diagnosis present

## 2021-10-24 LAB — TROPONIN I (HIGH SENSITIVITY): Troponin I (High Sensitivity): 89 ng/L — ABNORMAL HIGH (ref <18)

## 2021-10-24 LAB — RESP PANEL BY RT-PCR (FLU A&B, COVID) ARPGX2
Influenza A by PCR: NEGATIVE
Influenza B by PCR: NEGATIVE
SARS Coronavirus 2 by RT PCR: NEGATIVE

## 2021-10-24 MED ORDER — ACETAMINOPHEN 325 MG RE SUPP: 650.0000 mg | Freq: Four times a day (QID) | RECTAL | Status: DC | PRN

## 2021-10-24 MED ORDER — AMLODIPINE BESYLATE 5 MG PO TABS
5.0000 mg | ORAL_TABLET | Freq: Every day | ORAL | Status: DC
  Administered 2021-10-24: 5 mg via ORAL
  Filled 2021-10-24: qty 1

## 2021-10-24 MED ORDER — HYDRALAZINE HCL 50 MG PO TABS
25.0000 mg | ORAL_TABLET | Freq: Two times a day (BID) | ORAL | Status: DC
  Administered 2021-10-24: 25 mg via ORAL
  Filled 2021-10-24: qty 1

## 2021-10-24 MED ORDER — FUROSEMIDE 10 MG/ML IJ SOLN
8.0000 mg/h | INTRAVENOUS | Status: DC
  Administered 2021-10-24 – 2021-10-25 (×2): 8 mg/h via INTRAVENOUS
  Filled 2021-10-24 (×2): qty 20

## 2021-10-24 MED ORDER — ONDANSETRON HCL 4 MG/2ML IJ SOLN: 4.0000 mg | Freq: Four times a day (QID) | INTRAMUSCULAR | Status: DC | PRN

## 2021-10-24 MED ORDER — VITAMIN B-12 1000 MCG PO TABS
1000.0000 ug | ORAL_TABLET | Freq: Every day | ORAL | Status: DC
  Administered 2021-10-25: 1000 ug via ORAL
  Filled 2021-10-24: qty 1

## 2021-10-24 MED ORDER — ISOSORBIDE MONONITRATE ER 60 MG PO TB24
60.0000 mg | ORAL_TABLET | Freq: Every day | ORAL | Status: DC
  Administered 2021-10-25: 60 mg via ORAL
  Filled 2021-10-24: qty 1

## 2021-10-24 MED ORDER — POTASSIUM CHLORIDE CRYS ER 20 MEQ PO TBCR
20.0000 meq | EXTENDED_RELEASE_TABLET | Freq: Three times a day (TID) | ORAL | Status: DC
  Administered 2021-10-24 (×2): 20 meq via ORAL
  Filled 2021-10-24 (×2): qty 1

## 2021-10-24 MED ORDER — ACETAMINOPHEN 325 MG PO TABS: 650.0000 mg | ORAL_TABLET | Freq: Four times a day (QID) | ORAL | Status: DC | PRN

## 2021-10-24 MED ORDER — SENNOSIDES-DOCUSATE SODIUM 8.6-50 MG PO TABS: 1.0000 | ORAL_TABLET | Freq: Every evening | ORAL | Status: DC | PRN

## 2021-10-24 MED ORDER — HYDRALAZINE HCL 50 MG PO TABS
25.0000 mg | ORAL_TABLET | Freq: Two times a day (BID) | ORAL | Status: DC
  Administered 2021-10-24 – 2021-10-25 (×2): 25 mg via ORAL
  Filled 2021-10-24 (×2): qty 1

## 2021-10-24 MED ORDER — ATORVASTATIN CALCIUM 20 MG PO TABS
40.0000 mg | ORAL_TABLET | Freq: Every day | ORAL | Status: DC
  Administered 2021-10-24: 40 mg via ORAL
  Filled 2021-10-24: qty 2

## 2021-10-24 MED ORDER — ONDANSETRON HCL 4 MG PO TABS: 4.0000 mg | ORAL_TABLET | Freq: Four times a day (QID) | ORAL | Status: DC | PRN

## 2021-10-24 MED ORDER — FERROUS FUMARATE 324 (106 FE) MG PO TABS
1.0000 | ORAL_TABLET | Freq: Every day | ORAL | Status: DC
  Administered 2021-10-24: 106 mg via ORAL
  Filled 2021-10-24 (×2): qty 1

## 2021-10-24 MED ORDER — FERROUS FUMARATE-VITAMIN C ER 65-25 MG PO TBCR: 1.0000 | EXTENDED_RELEASE_TABLET | Freq: Every day | ORAL | Status: DC

## 2021-10-24 MED ORDER — METOPROLOL TARTRATE 5 MG/5ML IV SOLN: 5.0000 mg | Freq: Four times a day (QID) | INTRAVENOUS | Status: DC | PRN

## 2021-10-24 MED ORDER — BISACODYL 5 MG PO TBEC: 5.0000 mg | DELAYED_RELEASE_TABLET | Freq: Every day | ORAL | Status: DC | PRN

## 2021-10-24 MED ORDER — ENOXAPARIN SODIUM 30 MG/0.3ML IJ SOSY
30.0000 mg | PREFILLED_SYRINGE | INTRAMUSCULAR | Status: DC
  Administered 2021-10-24: 30 mg via SUBCUTANEOUS
  Filled 2021-10-24: qty 0.3

## 2021-10-24 MED ORDER — VITAMIN D 25 MCG (1000 UNIT) PO TABS
1000.0000 [IU] | ORAL_TABLET | Freq: Every day | ORAL | Status: DC
  Administered 2021-10-25: 1000 [IU] via ORAL
  Filled 2021-10-24: qty 1

## 2021-10-24 MED ORDER — CLONIDINE HCL 0.1 MG PO TABS
0.2000 mg | ORAL_TABLET | Freq: Two times a day (BID) | ORAL | Status: DC
  Administered 2021-10-24 – 2021-10-25 (×3): 0.2 mg via ORAL
  Filled 2021-10-24 (×3): qty 2

## 2021-10-24 NOTE — Progress Notes (Signed)
[place], [place] 10/24/21  Subjective:   Hospital day # 0 Patient is known to our practice from outpatient follow-up.  She has medical problems of diabetic chronic kidney disease, CKD 4, hypertension, lower extremity edema chronic, anemia of chronic kidney disease, chronic diastolic CHF  and lymphedema Patient's patient is accompanied by her granddaughter today, who is her primary caretaker and provides majority of the history. She reports that over the last few weeks, especially since Thanksgiving, lower extremity edema has worsened.  Outpatient management was tried with IV Lasix twice a week.  Also, torsemide was increased to 80 mg the morning and 40 mg in the afternoon.  However patient was only taking 80 mg in the morning out of concern for dehydration.  Renal: No intake/output data recorded. Lab Results  Component Value Date   CREATININE 2.20 (H) 10/23/2021   CREATININE 1.46 (H) 07/02/2021   CREATININE 1.49 (H) 07/01/2021     Objective:  Vital signs in last 24 hours:  Temp:  [98 F (36.7 C)] 98 F (36.7 C) (01/03 1658) Pulse Rate:  [78-88] 88 (01/04 0523) Resp:  [18-24] 22 (01/04 0523) BP: (198-205)/(75-98) 205/98 (01/04 0523) SpO2:  [96 %-98 %] 98 % (01/04 0523) Weight:  [81.6 kg] 81.6 kg (01/03 1659)  Weight change:  Filed Weights   10/23/21 1659  Weight: 81.6 kg    Intake/Output:   No intake or output data in the 24 hours ending 10/24/21 1218   Physical Exam: General: Elderly lady, laying in the bed  HEENT Anicteric, moist oral mucous membranes  Pulm/lungs Bilateral diffuse crackles  CVS/Heart Regular, systolic murmur present  Abdomen:  Soft, distended, everted umbilicus  Extremities: 3-4+ pitting edema bilaterally, lymphedema  Neurologic: Alert, able to follow commands appropriately  Skin: Warm, dry, blister ulcer noted on right lower extremity       Basic Metabolic Panel:  Recent Labs  Lab 10/23/21 1703  NA 142  K  4.6  CL 108  CO2 26  GLUCOSE 125*  BUN 56*  CREATININE 2.20*  CALCIUM 9.3     CBC: Recent Labs  Lab 10/23/21 1703  WBC 7.1  HGB 12.0  HCT 38.1  MCV 85.6  PLT 187     No results found for: HEPBSAG, HEPBSAB, HEPBIGM    Microbiology:  Recent Results (from the past 240 hour(s))  Resp Panel by RT-PCR (Flu A&B, Covid) Nasopharyngeal Swab     Status: None   Collection Time: 10/24/21  8:23 AM   Specimen: Nasopharyngeal Swab; Nasopharyngeal(NP) swabs in vial transport medium  Result Value Ref Range Status   SARS Coronavirus 2 by RT PCR NEGATIVE NEGATIVE Final    Comment: (NOTE) SARS-CoV-2 target nucleic acids are NOT DETECTED.  The SARS-CoV-2 RNA is generally detectable in upper respiratory specimens during the acute phase of infection. The lowest concentration of SARS-CoV-2 viral copies this assay can detect is 138 copies/mL. A negative result does not preclude SARS-Cov-2 infection and should not be used as the sole basis for treatment or other patient management decisions. A negative result may occur with  improper specimen collection/handling, submission of specimen other than nasopharyngeal swab, presence of viral mutation(s) within the areas targeted by this assay, and inadequate number of viral copies(<138 copies/mL). A negative result must be combined with clinical observations, patient history, and epidemiological information. The expected result is Negative.  Fact Sheet for Patients:  EntrepreneurPulse.com.au  Fact Sheet for Healthcare Providers:  IncredibleEmployment.be  This test is no t yet approved  or cleared by the [place] and  has been authorized for detection and/or diagnosis of SARS-CoV-2 by FDA under an Emergency Use Authorization (EUA). This EUA will remain  in effect (meaning this test can be used) for the duration of the COVID-19 declaration under Section 564(b)(1) of the Act, 21 U.S.C.section  360bbb-3(b)(1), unless the authorization is terminated  or revoked sooner.       Influenza A by PCR NEGATIVE NEGATIVE Final   Influenza B by PCR NEGATIVE NEGATIVE Final    Comment: (NOTE) The Xpert Xpress SARS-CoV-2/FLU/RSV plus assay is intended as an aid in the diagnosis of influenza from Nasopharyngeal swab specimens and should not be used as a sole basis for treatment. Nasal washings and aspirates are unacceptable for Xpert Xpress SARS-CoV-2/FLU/RSV testing.  Fact Sheet for Patients: EntrepreneurPulse.com.au  Fact Sheet for Healthcare Providers: IncredibleEmployment.be  This test is not yet approved or cleared by the [place] FDA and has been authorized for detection and/or diagnosis of SARS-CoV-2 by FDA under an Emergency Use Authorization (EUA). This EUA will remain in effect (meaning this test can be used) for the duration of the COVID-19 declaration under Section 564(b)(1) of the Act, 21 U.S.C. section 360bbb-3(b)(1), unless the authorization is terminated or revoked.  Performed at [facility], [address] [place], [place] [address]     Coagulation Studies: No results for input(s): LABPROT, INR in the last 72 hours.  Urinalysis: No results for input(s): COLORURINE, LABSPEC, PHURINE, GLUCOSEU, HGBUR, BILIRUBINUR, KETONESUR, PROTEINUR, UROBILINOGEN, NITRITE, LEUKOCYTESUR in the last 72 hours.  Invalid input(s): APPERANCEUR    Imaging: DG Chest 2 View  Result Date: 10/23/2021 CLINICAL DATA:  CHF EXAM: CHEST - 2 VIEW COMPARISON:  09/18/2021 FINDINGS: Cardiac enlargement without heart failure. Atherosclerotic calcification aortic arch Moderate elevation right hemidiaphragm unchanged. Right lower lobe airspace density unchanged. This is likely atelectasis given the elevated hemidiaphragm. Mild left lower lobe atelectasis unchanged. No significant pleural effusion. IMPRESSION: Cardiac enlargement without heart  failure. Bibasilar airspace disease unchanged most likely atelectasis. Moderate elevation right hemidiaphragm unchanged. Electronically Signed   By: Franchot Gallo M.D.   On: 10/23/2021 17:41     Medications:    furosemide (LASIX) 200 mg in dextrose 5% 100 mL (2mg /mL) infusion 8 mg/hr (10/24/21 0953)    amLODipine  5 mg Oral Daily   atorvastatin  40 mg Oral QHS   cholecalciferol  1,000 Units Oral Daily   cloNIDine  0.2 mg Oral BID   Ferrous Fumarate-Vitamin C ER  1 tablet Oral QHS   isosorbide mononitrate  60 mg Oral Daily   potassium chloride SA  20 mEq Oral TID   vitamin B-12  1,000 mcg Oral Daily     Assessment/ Plan:  88 y.o. female with chronic diastolic CHF with preserved EF, diabetes type 2, chronic kidney disease stage IV, hypertension, who osteoarthritis, diabetic neuropathy, lymphedema, peripheral edema    admitted on 10/24/2021 for Acute on chronic diastolic CHF (congestive heart failure) ([place]) [I50.33]  1.  Acute kidney injury on chronic kidney disease stage 3B. Baseline creatinine of 1.46, GFR 33 from July 02, 2021 AKI likely secondary to abnormal cardiorenal hemodynamics/cardiorenal syndrome Will monitor renal function closely during this hospitalization. Avoid hypotension.  2.  Volume overload with severe lower extremity edema, lymphedema Will attempt IV diuresis with IV furosemide infusion In context of chronic edema, amlodipine and hydralazine may not be the best antihypertensives. Will discuss with primary team to consider Cardura Patient is listed as allergic to ACE  inhibitor.     LOS: 0 Clenton Esper 1/4/202312:18 PM  [place], [place]  Note: This note was prepared with Dragon dictation. Any transcription errors are unintentional

## 2021-10-24 NOTE — H&P (Signed)
History and Physical    [name] PYP:[id number] DOB: 15-Feb-1925 DOA: 10/24/2021  PCP: Melba Coon, MD  Patient coming from: [place] office  I have personally briefly reviewed patient's old medical records in [facility].  Chief Complaint: shortness of breath  HPI: [name] [name] is a 88 y.o. female with medical history significant for diastolic congestive heart failure (EF 50-55% by 07/02/2021 echocardiogram), obstructive sleep apnea, diabetes, chronic kidney disease stage 3, anemia, hypertension, hyperlipidemia, anemia, arthritis, dementia, history of GI bleed, who presents to the [facility] emergency department on 10/24/2021 with continued shortness of breath and leg swelling.  Patient was sent by her doctor's office for admission due to failed outpatient management of her CHF exacerbation; she has recently been seen 3 separate times in the outpatient area for doses of IV furosemide. Patient's shortness of breath and worsening leg swelling began about 2 weeks before admission. She has been progressively more short of breath over the last 2 weeks and now she gets short of breath even walking to the next room. Associated symptoms: She has wheezing that began about 2 weeks ago and has been persistent. She has a non-productive cough. She has gained about 10 lbs in the last 2 weeks. She has had increased swelling in her legs and abdomen. She is more sleepy over the last 2 weeks. Chronically she sleeps in a recliner sleeping sitting up but this has not changed. Has indigestion and lower chest pain especially at night; it happens every few weeks, and the most recent was about a week ago. Pain is up to 8/10 at times and characterized as heaviness; she does not have shortness of breath, nausea, abdominal pain, or diaphoresis with the episodes. Indigestion and chest pain are alleviated by nothing and exacerbated by nothing; not worse with walking. She takes a Tums and it gets better. She has OSA but no  longer wears a CPAP because she could not get the mask to fit to be able to tolerate it.   Treatments: Initially patient was taking only 80 mg torsemide with 40 mg additional as needed, but then was prescribed 80 mg every morning and 40 mg every afternoon/evening scheduled. For about 2 weeks she has been taking just the 80 mg; before that she was doing 80 and 40 as prescribed. Granddaughter was concerned about risk of worsening kidney function. She does not drink much fluid. She said urine output may be a lot and other times she does not urinate as often.   ED Course: Patient was afebrile, pulse 79, BP 205/98. Labs include creatinine 2.2  (recently 1.9, baseline 1.4), GFR 20, glucose 125, BNP 489, high-sensitivity troponin 89, and normal CBC.  SARS-CoV-2 PCR and influenza  A and B were all negative. ED contacted nephrologist, who recommended an IV furosemide drip, which was started.  Review of cardiology office visit notes reveal that patient is prescribed torsemide 80 mg q am and 40 mg every evening; cardiologist made the following changes to the patient's medications on 10/04/21: Decrease amlodipine to 5 mg daily; Increase isosorbide mononitrate Imdur to 60 mg daily; and add hydralazine 25 mg 2 times per day.    History Notes:  Echocardiogram, 07/02/2021, Dr. Fletcher Anon: IMPRESSIONS  EF 50-55%  1. Left ventricular ejection fraction, by estimation, is 50 to 55%. The  left ventricle has low normal function. Left ventricular endocardial  border not optimally defined to evaluate regional wall motion. The left  ventricular internal cavity size was mildly dilated.  Left ventricular diastolic parameters  are consistent with  Grade I diastolic dysfunction (impaired relaxation).   2. Right ventricular systolic function is normal. The right ventricular  size is normal. Tricuspid regurgitation signal is inadequate for assessing  PA pressure.   3. Left atrial size was mildly dilated.   4. The mitral valve is  normal in structure. No evidence of mitral valve  regurgitation. No evidence of mitral stenosis.   5. The aortic valve is normal in structure. Aortic valve regurgitation is  mild. Mild aortic valve sclerosis is present, with no evidence of aortic  valve stenosis.      Challenging image quality.    Notes from cardiology office visit 10/04/2021: Changes to her medications that were made during this visit include the following: Decrease amlodipine to 5 mg daily Increase isosorbide mononitrate Imdur to 60 mg daily Torsemide 20 mg tablets take 80 mg in the morning and an extra 40 mg in the early evening Add hydralazine 25 mg 2 times per day  Final home medication list after cardiology visit: CURRENT MEDICATIONS:  Current Outpatient Medications  Medication Sig Dispense Refill   amLODIPine (NORVASC) 5 MG tablet Take 1 tablet (5 mg total) by mouth once daily   atorvastatin (LIPITOR) 40 MG tablet Take 40 mg by mouth once daily.    cloNIDine HCL (CATAPRES) 0.2 MG tablet Take 1 tablet (0.2 mg total) by mouth 2 (two) times daily   cyanocobalamin (VITAMIN B12) 1000 MCG tablet Take 500 mcg by mouth once daily   ferrous sulfate 325 (65 FE) MG tablet Take 1 tablet (325 mg total) by mouth every other day 0   hydrocortisone 2.5 % cream Apply 1 Application topically 2 (two) times daily as needed   isosorbide mononitrate (IMDUR) 60 MG ER tablet Take 1 tablet (60 mg total) by mouth once daily 90 tablet 3   potassium chloride (KLOR-CON) 20 MEQ ER tablet Take 1 tablet (20 mEq total) by mouth 2 (two) times daily 180 tablet 3   TORsemide (DEMADEX) 20 MG tablet 80 mg in AM and extra 40 mg in PM 540 tablet 3   triamcinolone 0.1 % cream Apply 1 Application topically 2 (two) times daily as needed   hydrALAZINE (APRESOLINE) 25 MG tablet Take 1 tablet (25 mg total) by mouth 2 (two) times daily 60 tablet 11   ______  Review of Systems: As per HPI otherwise all other systems reviewed and are  unremarable. MUSCULOSKELETAL: no new joint pain or joint swelling.  ______  Past Medical History:  Diagnosis Date   Anemia    CHF (congestive heart failure) (HCC)    Chronic kidney disease    Dementia (HCC)    Diabetes mellitus without complication (HCC)    GIB (gastrointestinal bleeding)    Hyperlipidemia    Hypertension    Lymphedema    OSA on CPAP    Osteoarthritis    Vitamin D deficiency     Past Surgical History:  Procedure Laterality Date   ABDOMINAL HYSTERECTOMY      Social History  reports that she has never smoked. She has never used smokeless tobacco. She reports that she does not drink alcohol and does not use drugs.  Allergies  Allergen Reactions   Codeine Swelling   Lisinopril Swelling    Other reaction(s): Other angioedema    Prednisolone     Other reaction(s): Other Causes increased blood pressure    Family History  Problem Relation Age of Onset   Kidney disease Mother    Congestive Heart  Failure Mother    Lung cancer Daughter      Home Medications  Prior to Admission medications   Medication Sig Start Date End Date Taking? Authorizing Provider  acetaminophen (TYLENOL) 500 MG tablet Take 500 mg by mouth every 6 (six) hours as needed for pain. 01/04/21 01/04/22  [provider]  amLODipine (NORVASC) 2.5 MG tablet Take 7.5 mg by mouth daily. 04/15/21   [provider]  atorvastatin (LIPITOR) 40 MG tablet Take 40 mg by mouth at bedtime. 04/19/21   [provider]  cholecalciferol (VITAMIN D3) 25 MCG (1000 UNIT) tablet Take 1,000 Units by mouth daily.    [provider]  cloNIDine (CATAPRES) 0.2 MG tablet Take 1 tablet (0.2 mg total) by mouth 2 (two) times daily. 07/10/21   Alisa Graff, FNP  Ferrous Fumarate-Vitamin C ER (FERRO-SEQUELS) 65-25 MG TBCR Take 1 tablet by mouth at bedtime.    [provider]  isosorbide mononitrate (IMDUR) 30 MG 24 hr tablet Take 30 mg by mouth daily. 05/03/21   [provider]  potassium chloride SA (KLOR-CON M) 20 MEQ tablet Take 20 mEq by mouth 3 (three) times daily.    [provider]  torsemide (DEMADEX) 20 MG tablet Take 1 tablet (20 mg total) by mouth daily. 07/03/21   Lorella Nimrod, MD  vitamin B-12 (CYANOCOBALAMIN) 1000 MCG tablet Take 1,000 mcg by mouth daily.    [provider]    Physical Exam: Vitals:   10/23/21 1658 10/23/21 1659 10/23/21 2242 10/24/21 0523  BP: (!) 204/75  (!) 198/83 (!) 205/98  Pulse: 79  78 88  Resp: (!) 24  18 (!) 22  Temp: 98 F (36.7 C)     TempSrc: Oral     SpO2: 98%  96% 98%  Weight:  81.6 kg    Height:  5' (1.524 m)      Constitutional: NAD, calm, comfortable, ill-appearing. Vitals:   10/23/21 1658 10/23/21 1659 10/23/21 2242 10/24/21 0523  BP: (!) 204/75  (!) 198/83 (!) 205/98  Pulse: 79  78 88  Resp: (!) 24  18 (!) 22  Temp: 98 F (36.7 C)     TempSrc: Oral     SpO2: 98%  96% 98%  Weight:  81.6 kg    Height:  5' (1.524 m)     Eyes: Pupils equal and round, lids and conjunctivae without icterus or erythema. ENMT: Mucous membranes are dry. Posterior pharynx clear of any exudate or lesions. Nares patent without discharge or bleeding.  Normocephalic, atraumatic.  Normal dentition.  Neck: normal, supple, no masses, trachea midline.  Thyroid nontender, no masses appreciated, no thyromegaly. Respiratory: clear to auscultation bilaterally. Chest wall movements are symmetric. No wheezing, no crackles.  No rhonchi.  Normal respiratory effort. No accessory muscle use.  Cardiovascular: Normal S1, S2. Murmur 2/6 systolic. Mild tachycardia. No rubs / gallops. Pulses: Radial and DP pulses 2+ bilaterally. No carotid bruits.  Capillary refill less than 3 seconds. Edema: Non-pitting lymphedema and 3+ pitting edema in the lower extremities bilaterally. GI: soft, non-distended, normal active bowel sounds. No hepatosplenomegaly. No rigidity, rebound, or guarding. Non-tender. No masses palpated. No CVA  tenderness bilaterally. Musculoskeletal: no clubbing / cyanosis. No joint deformity upper and lower extremities. Good ROM, no contractures. Normal muscle tone.  No tenderness or deformity in the back bilaterally. Integument: no rashes. No induration. Clean, dry. Superficial open wound on lower anterior right leg; erythematous at center; no exudate; approximately 2 cm diameter, round. Neurologic:  CN 2-12 grossly intact. Sensation grossly intact to light touch. Babinski: Toes downgoing bilaterally.  Strength 5/5 in all upper extremities. Strength 3-/5 in lower extremities, symmetric.  Intact rapid alternating movements bilaterally.  No pronator drift. Psychiatric: Normal judgment and insight. Alert and oriented to person and place. Normal mood.  Normal and appropriate affect. Able to answer questions and follow commands. Lymphatic: No cervical lymphadenopathy. No supraclavicular lymphadenopathy.   Labs on Admission: I have personally reviewed the following labs and imaging studies.  CBC: Recent Labs  Lab 10/23/21 1703  WBC 7.1  HGB 12.0  HCT 38.1  MCV 85.6  PLT 119    Basic Metabolic Panel: Recent Labs  Lab 10/23/21 1703  NA 142  K 4.6  CL 108  CO2 26  GLUCOSE 125*  BUN 56*  CREATININE 2.20*  CALCIUM 9.3    GFR: Estimated Creatinine Clearance: 14.8 mL/min (A) (by C-G formula based on SCr of 2.2 mg/dL (H)).  Liver Function Tests: Recent Labs  Lab 10/23/21 1703  AST 38  ALT 31  ALKPHOS 64  BILITOT 0.9  PROT 7.8  ALBUMIN 3.6    Radiological Exams on Admission:  Independently reviewed chest x-ray.  DG Chest 2 View  Result Date: 10/23/2021 CLINICAL DATA:  CHF EXAM: CHEST - 2 VIEW COMPARISON:  09/18/2021 FINDINGS: Cardiac enlargement without heart failure. Atherosclerotic calcification aortic arch Moderate elevation right hemidiaphragm unchanged. Right lower lobe airspace density unchanged. This is likely atelectasis given the elevated hemidiaphragm. Mild left lower  lobe atelectasis unchanged. No significant pleural effusion. IMPRESSION: Cardiac enlargement without heart failure. Bibasilar airspace disease unchanged most likely atelectasis. Moderate elevation right hemidiaphragm unchanged. Electronically Signed   By: Franchot Gallo M.D.   On: 10/23/2021 17:41    EKG: Independently reviewed.  75 bpm.  Normal sinus rhythm.  Artifact present.  QTc 424.  Flat T wave in leads I, aVL, V2, and V6.  ST depression in V5.   Assessment/Plan  Principal Problem:    Acute on chronic diastolic CHF (congestive heart failure) ([place]) Failed outpatient management and also failed intermittent IV furosemide ordered through the outpatient center.  Patient is not short of breath when sitting but has fairly severe dyspnea with any exertion.  Nephrologist recommends IV furosemide drip. Echocardiogram: September 2022: EF 50-55% Plan: CHF order set.  Lasix IV drip. No Beta blocker on admission: there is an indication patient had bradycardia in the past and that is likely the reason her cardiologist does not having patient taking a beta blocker. Pt just had a visit with cardiologist and need to assume patient was felt not to be a candidate for a beta blocker.  No ACE due to history of angioedema. Strict I/O. 2g Na diet. Oxygen support by nasal cannula as needed. Instruct patient regarding CHF diagnosis. Instruct patient to weigh self daily and to contact doctor's office and/or follow doctor's plan if there is a weight gain of 2-3 lbs in 1 day or 5 lbs in 3-7 days. Provide teaching regarding 2g Na diet. Discussed with the patient and her granddaughter the importance of taking her home torsemide as prescribed when she goes home. Nephrologist consulted.   Active Problems:    Essential hypertension Blood pressures elevated upon admission but patient has not had her home blood pressure medications. Plan: Continue home medications including amlodipine 5 mg daily, hydralazine 25 mg  twice a day, Imdur 60 mg daily, clonidine 0.2 mg twice a day.If still with elevated blood pressure will give PRN medication.  Type 2 diabetes with nephropathy, without long-term current use of insulin (HCC) Diet controlled. Plan: Recheck glucose in the AM. Will hold off on sliding scale insulin and finger stick glucose checks initially due to diet controlled DM.    CKD (chronic kidney disease), stage III (HCC) Likely due to hypertension and diabetes.  Followed by nephrologist. Plan: Avoid nephrotoxins.  Monitor I/O Nephrology consult.    Obstructive sleep apnea She stopped using CPAP long ago because she could not find a mask she could tolerate. Plan: Encouraged patient to follow-up outpatient with primary care to see if she could be tried on nasal prong apparatus.    Dyslipidemia Plan: Statin.    Anemia in chronic kidney disease Plan: monitor for any bleeding. Recheck CBC  Lymphedema Plan: We will need outpatient follow-up  Superficial wound of the right lower leg, initial encounter Could be related to her lymphedema Plan: Wound care consult..     Superior mesenteric artery stenosis ([place]) Diagnosed previously. Plan: Outpatient follow up with primary care.    Nonrheumatic mitral (valve) insufficiency and    Nonrheumatic aortic valve insufficiency Plan: Monitor I/O. Follow up outpatient with primary care and/or cardiology.    Lumbar stenosis with neurogenic claudication Plan: Encourage ambulation. PRN pain medications. Outpatient follow up.    Primary osteoarthritis Plan: Pain medications as needed and outpatient follow-up.    Dementia without behavioral disturbance ([place]) Plan: Scheduled melatonin. Risk of delirium while hospitalized. Follow up outpatient.    DVT prophylaxis: Lovenox.  Code Status:   Full Code Family Communication:   With granddaugher at bedside [phone]    Disposition Plan:   Patient is from:  Home  Anticipated DC to:  Home  Anticipated DC  date:  10/28/21  Anticipated DC barriers: Persistent shortness of breath  Consults called:  Nephrologist, Dr. Candiss Norse  Admission status:  Inpatient   Severity of Illness: The appropriate patient status for this patient is INPATIENT. Inpatient status is judged to be reasonable and necessary in order to provide the required intensity of service to ensure the patient's safety. The patient's presenting symptoms, physical exam findings, and initial radiographic and laboratory data in the context of their chronic comorbidities is felt to place them at high risk for further clinical deterioration. Furthermore, it is not anticipated that the patient will be medically stable for discharge from the hospital within 2 midnights of admission.   * I certify that at the point of admission it is my clinical judgment that the patient will require inpatient hospital care spanning beyond 2 midnights from the point of admission due to high intensity of service, high risk for further deterioration and high frequency of surveillance required.*  _______________________  MDM Reviewed: previous chart and vitals Reviewed previous: labs Interpretation: labs, ECG and x-ray  Medical Decision Making Amount and/or Complexity of Data Reviewed Independent Historian: caregiver    Details: Additional history from granddaughter regarding how patient takes her medications and her symptoms. External Data Reviewed: labs and notes.    Details: Cardiology office notes including medication changes. Previous labs including metabolic panel, Creatinine, WBCs, Hb, platelets. Labs: ordered.    Details: Ordered Follow up CBC, metabolic panel. Reviewed labs including metabolic panel, Creatinine, WBCs, Hb, platelets. Radiology: independent interpretation performed.    Details: CXR with no acute pulmonary edema or pneumonia. ECG/medicine tests: independent interpretation performed.    Details: See note. Discussion of management or test  interpretation with external provider(s): Discussed treatment and course with ED physician, nephrologist.   Risk Prescription  drug management. Parenteral controlled substances. Drug therapy requiring intensive monitoring for toxicity. Decision regarding hospitalization. Risk Details: Failed outpatient management with oral diuretics.  Placed on IV furosemide drip. Discussed treatment plan and home care with patient and granddaughter. Total time: 90 minutes.      _______________________   Tacey Ruiz MD [facility]  How to contact the [facility] Attending or Consulting provider [place] or covering provider during after hours [place], for this patient?   Check the care team in [facility] and look for a) attending/consulting [facility] provider listed and b) the [facility] team listed Log into [email or web address] and use [place]'s universal password to access. If you do not have the password, please contact the hospital operator. Locate the [facility] provider you are looking for under [facility] and page to a number that you can be directly reached. If you still have difficulty reaching the provider, please page the [facility] (Director on Call) for the Hospitalists listed on amion for assistance.  10/24/2021, 9:01 AM

## 2021-10-24 NOTE — ED Notes (Signed)
Pt eating lunch. Call bell in reach

## 2021-10-24 NOTE — ED Notes (Signed)
Admitting MD at bedside.  Pt placed on cardiac monitoring.  Provided warm blanket as requested.  Call bell in reach

## 2021-10-24 NOTE — ED Provider Notes (Signed)
[facility] Provider Note    Event Date/Time   First MD Initiated Contact with Patient 10/24/21 [phone]     (approximate)   History   Chief Complaint fluid overload   HPI  [name] [name] is a 88 y.o. female with past medical history of hypertension, diabetes, CHF, CKD, lymphedema, and GERD who presents to the ED complaining of leg swelling.  73 of history is obtained from patient's granddaughter, who is also her caretaker.  She states that patient has had increasing swelling in her legs for the past 3 weeks to a month.  Over that time, she has become increasingly weak and short of breath with any exertion.  Patient typically ambulates with a walker with assistance from granddaughter, but has been unable to do so more recently.  Patient denies any fevers, cough, or chest pain.  Granddaughter does note small sore that has opened up on patient's right shin but has not noticed any redness, warmth, or drainage.  Patient has been getting regular IV doses of Lasix through nephrology without significant improvement in symptoms.  She was seen by her nephrologist yesterday and referred to the ED for admission for diuresis.     Physical Exam   Triage Vital Signs: ED Triage Vitals  Enc Vitals Group     BP 10/23/21 1658 (!) 204/75     Pulse Rate 10/23/21 1658 79     Resp 10/23/21 1658 (!) 24     Temp 10/23/21 1658 98 F (36.7 C)     Temp Source 10/23/21 1658 Oral     SpO2 10/23/21 1658 98 %     Weight 10/23/21 1659 180 lb (81.6 kg)     Height 10/23/21 1659 5' (1.524 m)     Head Circumference --      Peak Flow --      Pain Score 10/23/21 1659 0     Pain Loc --      Pain Edu? --      Excl. in [place]? --     Most recent vital signs: Vitals:   10/23/21 2242 10/24/21 0523  BP: (!) 198/83 (!) 205/98  Pulse: 78 88  Resp: 18 (!) 22  Temp:    SpO2: 96% 98%    Constitutional: Alert and oriented. Eyes: Conjunctivae are normal. Head: Atraumatic. Nose: No  congestion/rhinnorhea. Mouth/Throat: Mucous membranes are moist.  Cardiovascular: Normal rate, regular rhythm. Grossly normal heart sounds.  2+ radial pulses bilaterally. Respiratory: Normal respiratory effort.  No retractions. Lungs with crackles to bilateral bases. Gastrointestinal: Soft and nontender. No distention. Genitourinary: Deferred. Musculoskeletal: No lower extremity tenderness, 3+ pitting edema to mid thighs bilaterally.  Small open wound to right anterior shin draining serous fluid, no erythema, warmth, or purulence. Neurologic:  Normal speech and language. No gross focal neurologic deficits are appreciated.    ED Results / Procedures / Treatments   Labs (all labs ordered are listed, but only abnormal results are displayed) Labs Reviewed  CBC - Abnormal; Notable for the following components:      Result Value   RDW 17.5 (*)    All other components within normal limits  COMPREHENSIVE METABOLIC PANEL - Abnormal; Notable for the following components:   Glucose, Bld 125 (*)    BUN 56 (*)    Creatinine, Ser 2.20 (*)    GFR, Estimated 20 (*)    All other components within normal limits  BRAIN NATRIURETIC PEPTIDE - Abnormal; Notable for the following components:   B  Natriuretic Peptide 488.9 (*)    All other components within normal limits  TROPONIN I (HIGH SENSITIVITY) - Abnormal; Notable for the following components:   Troponin I (High Sensitivity) 89 (*)    All other components within normal limits  RESP PANEL BY RT-PCR (FLU A&B, COVID) [id number]     EKG  ED ECG REPORT I, Blake Divine, the attending physician, personally viewed and interpreted this ECG.   Date: 10/24/2021  EKG Time: 17:17  Rate: 75  Rhythm: normal sinus rhythm  Axis: Normal  Intervals:none  ST&T Change: None  RADIOLOGY Chest x-ray reviewed by me and shows cardiomegaly with bibasilar airspace disease representing edema versus atelectasis versus infiltrate.  PROCEDURES:  Critical Care  performed: No  Procedures   MEDICATIONS ORDERED IN ED: Medications  furosemide (LASIX) 200 mg in dextrose 5 % 100 mL (2 mg/mL) infusion (has no administration in time range)     IMPRESSION / MDM / ASSESSMENT AND PLAN / ED COURSE  I reviewed the triage vital signs and the nursing notes.                              88 y.o. female with past medical history of hypertension, diabetes, CKD, CHF, lymphedema, and GERD who presents to the ED complaining of increasing leg swelling for the past 3 weeks to a month.  Symptoms and not been alleviated by doses of IV Lasix or increased torsemide as an outpatient.  Differential diagnosis includes, but is not limited to, CHF exacerbation, DVT, AKI, electrolyte abnormality, lymphedema.  Patient is well-appearing and in no respiratory distress, maintaining O2 sats on room air.  She did become significantly short of breath just with pivoting from wheelchair to stretcher, crackles noted to bilateral lung bases and patient appears significantly fluid overloaded.  Chest x-ray shows cardiomegaly with infiltrates not consistent with pulmonary edema per radiology, however patient appears significantly fluid overloaded with crackles at bilateral bases.  Plan to provide IV diuresis, however patient noted to have mild AKI.  We will further discuss push dosing of Lasix versus drip with nephrology.  Additional labs are unremarkable, troponin mildly elevated but consistent with patient's chronic kidney disease, no electrolyte abnormality noted.  Patient's chart reviewed and was recently seen by cardiology at [facility] with increasing torsemide dose, has not had improvement in symptoms.  Case discussed with Dr. Candiss Norse of nephrology, who recommends proceeding with Lasix drip at 8 mg an hour.  Case discussed with hospitalist for admission.       FINAL CLINICAL IMPRESSION(S) / ED DIAGNOSES   Final diagnoses:  Acute on chronic congestive heart failure, unspecified heart failure  type ([place])  AKI (acute kidney injury) ([place])     Rx / DC Orders   ED Discharge Orders     None        Note:  This document was prepared using Dragon voice recognition software and may include unintentional dictation errors.   Blake Divine, MD 10/24/21 [phone]

## 2021-10-24 NOTE — ED Notes (Signed)
ED Provider at bedside. 

## 2021-10-24 NOTE — Progress Notes (Signed)
PHARMACIST - PHYSICIAN COMMUNICATION  CONCERNING:  Enoxaparin (Lovenox) for DVT Prophylaxis    RECOMMENDATION: Patient was prescribed enoxaparin 40mg  q24 hours for VTE prophylaxis.   Filed Weights   10/23/21 1659  Weight: 81.6 kg (180 lb)    Body mass index is 35.15 kg/m.  Estimated Creatinine Clearance: 14.8 mL/min (A) (by C-G formula based on SCr of 2.2 mg/dL (H)).  Patient is candidate for enoxaparin 30mg  every 24 hours based on CrCl <38ml/min or Weight <45kg  DESCRIPTION: Pharmacy has adjusted enoxaparin dose per [facility] policy.  Patient is now receiving enoxaparin 30 mg every 24 hours   Benita Gutter 10/24/2021 2:28 PM

## 2021-10-25 DIAGNOSIS — N183 Chronic kidney disease, stage 3 unspecified: Secondary | ICD-10-CM | POA: Diagnosis not present

## 2021-10-25 DIAGNOSIS — I890 Lymphedema, not elsewhere classified: Secondary | ICD-10-CM

## 2021-10-25 DIAGNOSIS — I351 Nonrheumatic aortic (valve) insufficiency: Secondary | ICD-10-CM

## 2021-10-25 DIAGNOSIS — S81801A Unspecified open wound, right lower leg, initial encounter: Secondary | ICD-10-CM

## 2021-10-25 DIAGNOSIS — F0390 Unspecified dementia without behavioral disturbance: Secondary | ICD-10-CM

## 2021-10-25 DIAGNOSIS — K551 Chronic vascular disorders of intestine: Secondary | ICD-10-CM

## 2021-10-25 DIAGNOSIS — N179 Acute kidney failure, unspecified: Secondary | ICD-10-CM | POA: Diagnosis not present

## 2021-10-25 DIAGNOSIS — I5033 Acute on chronic diastolic (congestive) heart failure: Secondary | ICD-10-CM | POA: Diagnosis not present

## 2021-10-25 DIAGNOSIS — M48062 Spinal stenosis, lumbar region with neurogenic claudication: Secondary | ICD-10-CM

## 2021-10-25 DIAGNOSIS — I509 Heart failure, unspecified: Secondary | ICD-10-CM | POA: Diagnosis not present

## 2021-10-25 DIAGNOSIS — G4733 Obstructive sleep apnea (adult) (pediatric): Secondary | ICD-10-CM

## 2021-10-25 DIAGNOSIS — I10 Essential (primary) hypertension: Secondary | ICD-10-CM

## 2021-10-25 DIAGNOSIS — E1121 Type 2 diabetes mellitus with diabetic nephropathy: Secondary | ICD-10-CM

## 2021-10-25 DIAGNOSIS — M1909 Primary osteoarthritis, other specified site: Secondary | ICD-10-CM

## 2021-10-25 LAB — CBC
HCT: 35.3 % — ABNORMAL LOW (ref 36.0–46.0)
Hemoglobin: 11.1 g/dL — ABNORMAL LOW (ref 12.0–15.0)
MCH: 26.5 pg (ref 26.0–34.0)
MCHC: 31.4 g/dL (ref 30.0–36.0)
MCV: 84.2 fL (ref 80.0–100.0)
Platelets: 167 10*3/uL (ref 150–400)
RBC: 4.19 MIL/uL (ref 3.87–5.11)
RDW: 17.5 % — ABNORMAL HIGH (ref 11.5–15.5)
WBC: 5.1 10*3/uL (ref 4.0–10.5)
nRBC: 0 % (ref 0.0–0.2)

## 2021-10-25 LAB — RENAL FUNCTION PANEL
Albumin: 3.1 g/dL — ABNORMAL LOW (ref 3.5–5.0)
Anion gap: 9 (ref 5–15)
BUN: 53 mg/dL — ABNORMAL HIGH (ref 8–23)
CO2: 29 mmol/L (ref 22–32)
Calcium: 8.2 mg/dL — ABNORMAL LOW (ref 8.9–10.3)
Chloride: 103 mmol/L (ref 98–111)
Creatinine, Ser: 1.97 mg/dL — ABNORMAL HIGH (ref 0.44–1.00)
GFR, Estimated: 23 mL/min — ABNORMAL LOW (ref >60)
Glucose, Bld: 261 mg/dL — ABNORMAL HIGH (ref 70–99)
Phosphorus: 3.8 mg/dL (ref 2.5–4.6)
Potassium: 3.4 mmol/L — ABNORMAL LOW (ref 3.5–5.1)
Sodium: 141 mmol/L (ref 135–145)

## 2021-10-25 MED ORDER — METOLAZONE 2.5 MG PO TABS: 2.5000 mg | ORAL_TABLET | ORAL | 0 refills | Status: AC

## 2021-10-25 MED ORDER — POTASSIUM CHLORIDE CRYS ER 20 MEQ PO TBCR: 40.0000 meq | EXTENDED_RELEASE_TABLET | Freq: Once | ORAL | Status: DC

## 2021-10-25 MED ORDER — POTASSIUM CHLORIDE CRYS ER 20 MEQ PO TBCR
20.0000 meq | EXTENDED_RELEASE_TABLET | Freq: Every day | ORAL | Status: DC
  Administered 2021-10-25: 20 meq via ORAL
  Filled 2021-10-25: qty 1

## 2021-10-25 MED ORDER — TORSEMIDE 20 MG PO TABS: 40.0000 mg | ORAL_TABLET | ORAL | Status: DC

## 2021-10-25 NOTE — Progress Notes (Addendum)
[place], [place] 10/25/21  Subjective:   Hospital day # 1 Patient is known to our practice from outpatient follow-up.  She has medical problems of diabetic chronic kidney disease, CKD 4, hypertension, lower extremity edema chronic, anemia of chronic kidney disease, chronic diastolic CHF  and lymphedema  Patient resting comfortably on stretcher Alert and oriented Daughter at bedside Denies shortness of breath Family concerned about weakness, requesting physical therapy eval  Renal: 01/04 0701 - 01/05 0700 In: -  Out: 1250 [Urine:1250] Lab Results  Component Value Date   CREATININE 1.97 (H) 10/25/2021   CREATININE 2.20 (H) 10/23/2021   CREATININE 1.46 (H) 07/02/2021     Objective:  Vital signs in last 24 hours:  Temp:  [97.6 F (36.4 C)] 97.6 F (36.4 C) (01/04 1709) Pulse Rate:  [72-85] 85 (01/05 0945) Resp:  [18-22] 22 (01/05 0945) BP: (152-169)/(60-92) 165/60 (01/05 0945) SpO2:  [95 %-100 %] 98 % (01/05 0945)  Weight change:  Filed Weights   10/23/21 1659  Weight: 81.6 kg    Intake/Output:    Intake/Output Summary (Last 24 hours) at 10/25/2021 1248 Last data filed at 10/25/2021 0422 Gross per 24 hour  Intake --  Output 1250 ml  Net -1250 ml     Physical Exam: General: Elderly lady, laying in the bed  HEENT Anicteric, moist oral mucous membranes  Pulm/lungs Bilateral basilar crackles  CVS/Heart Regular, systolic murmur present  Abdomen:  Soft, distended, everted umbilicus  Extremities: 0-3+ pitting edema bilaterally, lymphedema  Neurologic: Alert, able to follow commands appropriately  Skin: Warm, dry, blister ulcer noted on right lower extremity       Basic Metabolic Panel:  Recent Labs  Lab 10/23/21 1703 10/25/21 1005  NA 142 141  K 4.6 3.4*  CL 108 103  CO2 26 29  GLUCOSE 125* 261*  BUN 56* 53*  CREATININE 2.20* 1.97*  CALCIUM 9.3 8.2*  PHOS  --  3.8      CBC: Recent Labs  Lab 10/23/21 1703  10/25/21 1005  WBC 7.1 5.1  HGB 12.0 11.1*  HCT 38.1 35.3*  MCV 85.6 84.2  PLT 187 167      No results found for: HEPBSAG, HEPBSAB, HEPBIGM    Microbiology:  Recent Results (from the past 240 hour(s))  Resp Panel by RT-PCR (Flu A&B, Covid) Nasopharyngeal Swab     Status: None   Collection Time: 10/24/21  8:23 AM   Specimen: Nasopharyngeal Swab; Nasopharyngeal(NP) swabs in vial transport medium  Result Value Ref Range Status   SARS Coronavirus 2 by RT PCR NEGATIVE NEGATIVE Final    Comment: (NOTE) SARS-CoV-2 target nucleic acids are NOT DETECTED.  The SARS-CoV-2 RNA is generally detectable in upper respiratory specimens during the acute phase of infection. The lowest concentration of SARS-CoV-2 viral copies this assay can detect is 138 copies/mL. A negative result does not preclude SARS-Cov-2 infection and should not be used as the sole basis for treatment or other patient management decisions. A negative result may occur with  improper specimen collection/handling, submission of specimen other than nasopharyngeal swab, presence of viral mutation(s) within the areas targeted by this assay, and inadequate number of viral copies(<138 copies/mL). A negative result must be combined with clinical observations, patient history, and epidemiological information. The expected result is Negative.  Fact Sheet for Patients:  EntrepreneurPulse.com.au  Fact Sheet for Healthcare Providers:  IncredibleEmployment.be  This test is no t yet approved or cleared by the [place] FDA and  has  been authorized for detection and/or diagnosis of SARS-CoV-2 by FDA under an Emergency Use Authorization (EUA). This EUA will remain  in effect (meaning this test can be used) for the duration of the COVID-19 declaration under Section 564(b)(1) of the Act, 21 U.S.C.section 360bbb-3(b)(1), unless the authorization is terminated  or revoked sooner.        Influenza A by PCR NEGATIVE NEGATIVE Final   Influenza B by PCR NEGATIVE NEGATIVE Final    Comment: (NOTE) The Xpert Xpress SARS-CoV-2/FLU/RSV plus assay is intended as an aid in the diagnosis of influenza from Nasopharyngeal swab specimens and should not be used as a sole basis for treatment. Nasal washings and aspirates are unacceptable for Xpert Xpress SARS-CoV-2/FLU/RSV testing.  Fact Sheet for Patients: EntrepreneurPulse.com.au  Fact Sheet for Healthcare Providers: IncredibleEmployment.be  This test is not yet approved or cleared by the [place] FDA and has been authorized for detection and/or diagnosis of SARS-CoV-2 by FDA under an Emergency Use Authorization (EUA). This EUA will remain in effect (meaning this test can be used) for the duration of the COVID-19 declaration under Section 564(b)(1) of the Act, 21 U.S.C. section 360bbb-3(b)(1), unless the authorization is terminated or revoked.  Performed at [facility], [address] [place], [place] [address]     Coagulation Studies: No results for input(s): LABPROT, INR in the last 72 hours.  Urinalysis: No results for input(s): COLORURINE, LABSPEC, PHURINE, GLUCOSEU, HGBUR, BILIRUBINUR, KETONESUR, PROTEINUR, UROBILINOGEN, NITRITE, LEUKOCYTESUR in the last 72 hours.  Invalid input(s): APPERANCEUR    Imaging: DG Chest 2 View  Result Date: 10/23/2021 CLINICAL DATA:  CHF EXAM: CHEST - 2 VIEW COMPARISON:  09/18/2021 FINDINGS: Cardiac enlargement without heart failure. Atherosclerotic calcification aortic arch Moderate elevation right hemidiaphragm unchanged. Right lower lobe airspace density unchanged. This is likely atelectasis given the elevated hemidiaphragm. Mild left lower lobe atelectasis unchanged. No significant pleural effusion. IMPRESSION: Cardiac enlargement without heart failure. Bibasilar airspace disease unchanged most likely atelectasis. Moderate elevation  right hemidiaphragm unchanged. Electronically Signed   By: Franchot Gallo M.D.   On: 10/23/2021 17:41     Medications:    furosemide (LASIX) 200 mg in dextrose 5% 100 mL (2mg /mL) infusion 8 mg/hr (10/25/21 0550)    atorvastatin  40 mg Oral QHS   cholecalciferol  1,000 Units Oral Daily   cloNIDine  0.2 mg Oral BID   enoxaparin (LOVENOX) injection  30 mg Subcutaneous Q24H   Ferrous Fumarate  1 tablet Oral QHS   hydrALAZINE  25 mg Oral BID   isosorbide mononitrate  60 mg Oral Daily   potassium chloride SA  20 mEq Oral Daily   potassium chloride  40 mEq Oral Once   vitamin B-12  1,000 mcg Oral Daily     Assessment/ Plan:  88 y.o. female with chronic diastolic CHF with preserved EF, diabetes type 2, chronic kidney disease stage IV, hypertension, who osteoarthritis, diabetic neuropathy, lymphedema, peripheral edema    admitted on 10/24/2021 for Acute on chronic diastolic CHF (congestive heart failure) ([place]) [I50.33]  1.  Acute kidney injury on chronic kidney disease stage 3B. Baseline creatinine of 1.46, GFR 33 from July 02, 2021 AKI likely secondary to abnormal cardiorenal hemodynamics/cardiorenal syndrome. Failed outpatient oral diuretics.   Creatinine improved to 1.97 Urine output recorded of 1.25L overnight Continue Lasix drip for another day and transition to oral torsemide and weekly Metolazone.   2.  Volume overload with severe lower extremity edema, lymphedema Will attempt IV diuresis with IV furosemide infusion In  context of chronic edema, amlodipine and hydralazine may not be the best antihypertensives. Will discuss with primary team to consider Cardura Patient is listed as allergic to ACE inhibitor.  Primary team notified of PT request    LOS: [place] 1/5/202312:48 PM  [place], [place]  Note: This note was prepared with Dragon dictation. Any transcription errors are unintentional

## 2021-10-25 NOTE — Consult Note (Addendum)
WOC Nurse Consult Note: Patient receiving care in [facility] Hallway space 11. Reason for Consult: wound to RLE Wound type: presumed related to fluid overload. Pressure Injury POA: Yes/No/NA Measurement: approximately 2 cm round Wound bed: pink Drainage (amount, consistency, odor) none Periwound: intact, significant edema Dressing procedure/placement/frequency: GENTLY wash the wound on the RLE above the ankle with soap and water, pat dry. Place a size appropriate piece of Xeroform over the wound, secure with a few turns of kerlix.  Treatment of lymphedema exceeds the scope of practice of the [place] nurse. Below are some community resources available for this type of care.  Lymphedema  Resources (updated 08/2021) Each site requires a referral from your primary care MD [facility] [address], [place]  [phone] (Upper extremities)  [place], [place] [phone] (Lower extremities, PATIENT CAN NOT HAVE A WOUND)  [place]. [address] [place], [place] [address] [phone] [facility] [address] [place], [place] [address] [phone] [place], [address] Medical Office Building [place], [place] [phone]  [facility] [address], [place] [address] [phone]  [place] at [facility]  (only treatment for lymphedema related to cancer diagnosis) [place], [place] [address] [phone]    [facility] [place], [place] [address] [phone] [facility] [place] [phone] [place], [place] [address] [phone]  [place] nurse will not follow at this time.  Please re-consult the [place] team if needed.  Val Riles,  RN, MSN, CWOCN, CNS-BC, pager [phone]

## 2021-10-25 NOTE — ED Notes (Addendum)
O2 stats stayed between 96%-99% while ambulating with this tech.

## 2021-10-25 NOTE — Progress Notes (Signed)
°  °  Durable Medical Equipment  (From admission, onward)           Start     Ordered   10/25/21 1217  For home use only DME lightweight manual wheelchair with seat cushion  Once       Comments: Patient suffers from Congestive Heart Failure which impairs their ability to perform daily activities like bathing, dressing, walker, toileting in the home.  A walker will not resolve  issue with performing activities of daily living. A wheelchair will allow patient to safely perform daily activities. Patient is not able to propel themselves in the home using a standard weight wheelchair due to endurance and shortness of breath. Patient can self propel in the lightweight wheelchair. Length of need Lifetime. Accessories: elevating leg rests (ELRs), wheel locks, extensions and anti-tippers.  Seat Cushion and Back cushion   10/25/21 1217

## 2021-10-25 NOTE — Care Management Obs Status (Signed)
[place] NOTIFICATION   Patient Details  Name: [name] [name] MRN: [id number] Date of Birth: 02-24-1925   Medicare Observation Status Notification Given:  Yes    Shelbie Hutching, RN 10/25/2021, 11:54 AM

## 2021-10-25 NOTE — Progress Notes (Signed)
[place] ED11H Manufacturing engineer [facility]) Hospital Liaison Note  Notified by Doran [place], RN [facility] manager of patient/family request for [facility] Palliative services at home after discharge.  [facility] hospital liaison will follow patient for discharge disposition.  Please call with any hospice or outpatient palliative care related questions.  Thank you for the opportunity to participate in this patient's care.  Nadene Rubins, RN, BSN [place] [phone]

## 2021-10-25 NOTE — TOC Initial Note (Addendum)
Transition of Care [facility]) - Initial/Assessment Note    Patient Details  Name: [name] [name] MRN: [id number] Date of Birth: 1925/07/19  [facility] CM/SW Contact:    Shelbie Hutching, RN Phone Number: 10/25/2021, 12:53 PM  Clinical Narrative:                 Patient admitted to the hospital then switched to observation for acute on chronic CHF.  RNCM met with patient and her granddaughter at the bedside.  Granddaughter is primary caregiver and assists with all ADL's.  Patient can walk very short distances with a walker, she has a transport chair at home and a shower chair.  [employer] provides wheelchair transportation in [place] but Granddaughter provides transportation to the patient's PCP and Cardiologist both with [facility] in [place].   They would like to have home health care at discharge, patient has had [facility] in the past and they would like to use them again.  Merleen Nicely with [facility] accepted referral for RN, PT, OT, and SW.  Granddaughter requests a wheelchair for home.  Order placed and sent to [facility] through [facility] - they should be able to deliver the wheelchair to the home.   Outpatient palliative referral given to Lorenza Cambridge with [facility].    TOC will cont to follow - patient may discharge home today.   Expected Discharge Plan: [place] Barriers to Discharge: Continued Medical Work up   Patient Goals and CMS Choice Patient states their goals for this hospitalization and ongoing recovery are:: Patient's caregiver, granddaughter, wants home with home health services and OP Palliative to follow and a wheelchair at home CMS Medicare.gov Compare Post Acute Care list provided to:: Patient Represenative (must comment) Choice offered to / list presented to : [facility] POA / Guardian  Expected Discharge Plan and Services Expected Discharge Plan: [place]   Discharge Planning Services: CM Consult Post Acute Care Choice: [place]  arrangements for the past 2 months: Single Family Home                 DME Arranged: Youth worker wheelchair with seat cushion DME Agency: Other - Comment ([employer]) Date DME Agency Contacted: 10/25/21 Time DME Agency Contacted: 1252 Representative spoke with at DME Agency: FAxed referral [place] Arranged: RN, PT, OT, Social Work [facility] Agency: [facility] Date [place]: 10/25/21 Time [place]: 43 Representative spoke with at [place]: [place] Arrangements/Services Living arrangements for the past 2 months: [place] Lives with:: Relatives Patient language and need for interpreter reviewed:: Yes Do you feel safe going back to the place where you live?: Yes      Need for Family Participation in Patient Care: Yes (Comment) Care giver support system in place?: Yes (comment) (granddaughter) Current home services: DME (walker, transport chair, shower chair) Criminal Activity/Legal Involvement Pertinent to Current Situation/Hospitalization: No - Comment as needed  Activities of Daily Living      Permission Sought/Granted Permission sought to share information with : Case Manager, Family Supports, Other (comment) Permission granted to share information with : Yes, Verbal Permission Granted  Share Information with NAME: Tia Alert  Permission granted to share info w AGENCY: [facility]  Permission granted to share info w Relationship: daughter  Permission granted to share info w Contact Information: [phone]  Emotional Assessment Appearance:: Appears stated age Attitude/Demeanor/Rapport: Engaged Affect (typically observed): Accepting Orientation: : Oriented to Self, Oriented  to Place, Oriented to Situation Alcohol / Substance Use: Not Applicable Psych Involvement: No (comment)  Admission diagnosis:  Acute on chronic diastolic CHF (congestive heart failure) (HCC) [I50.33] Patient Active Problem List   Diagnosis Date Noted   Acute  on chronic diastolic CHF (congestive heart failure) (HCC) 10/24/2021   Wound of right leg, initial encounter 10/24/2021   SOB (shortness of breath)    Acute exacerbation of CHF (congestive heart failure) ([place]) 07/01/2021   CKD (chronic kidney disease), stage III ([place]) 07/01/2021   Essential hypertension 07/01/2021   Lymphedema 07/01/2021   Obstructive sleep apnea 07/01/2021   GERD (gastroesophageal reflux disease) 07/01/2021   Insomnia 07/01/2021   Primary osteoarthritis 07/01/2021   Angioedema 07/01/2021   Diverticulosis 06/02/2021   Impaired mobility 03/01/2021   Dementia without behavioral disturbance ([place]) 03/01/2021   History of GI bleed 01/04/2021   Hypokalemia 07/08/2018   Dyslipidemia 07/08/2018   Anemia in chronic kidney disease 07/08/2018   Iron deficiency 09/28/2017   Unspecified diastolic (congestive) heart failure ([place]) 06/19/2017   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   Lumbar stenosis with neurogenic claudication 07/10/2015   Vitamin D deficiency 03/31/2015   Nonrheumatic mitral (valve) insufficiency 09/29/2014   Nonrheumatic aortic valve insufficiency 09/29/2014   Vitamin B12 deficiency 01/31/2014   Type 2 diabetes with nephropathy ([place]) 09/28/2013   Type 2 diabetes mellitus with diabetic chronic kidney disease ([place]) 09/28/2013   DDD (degenerative disc disease), lumbosacral 04/06/2012   PCP:  Melba Coon, MD Pharmacy:   [facility] [address], [place] [address] Phone: [phone] Fax: [phone]     Social Determinants of Health (SDOH) Interventions    Readmission Risk Interventions No flowsheet data found.

## 2021-10-25 NOTE — ED Notes (Signed)
Secure msg sent to Dr. Ouida Sills re: pt's O2 sats with ambulation.

## 2021-10-25 NOTE — ED Notes (Signed)
Pt requested to be moved into a recliner at this time. This tech assisted with moving and pt is sitting comfortably in recliner.

## 2021-10-25 NOTE — Care Management CC44 (Signed)
Condition Code 44 Documentation Completed  Patient Details  Name: Lacole Komorowski MRN: [id number] Date of Birth: 03/23/1925   Condition Code 44 given:  Yes Patient signature on Condition Code 44 notice:  Yes Documentation of 2 MD's agreement:  Yes Code 44 added to claim:  Yes    Shelbie Hutching, RN 10/25/2021, 11:54 AM

## 2021-10-25 NOTE — Discharge Summary (Signed)
Physician Discharge Summary  [name] [name] [id number]:[id number] DOB: 12/10/1924 DOA: 10/24/2021  PCP: Melba Coon, MD  Admit date: 10/24/2021 Discharge date: 10/25/2021  Admitted From: Home Disposition: Home health- PT, OT, RN, CSW. Wheelchair ordered at discharge  Recommendations for Outpatient Follow-up:  Follow up with PCP within 1-2 weeks to monitor electrolytes given large amount of diuresis while inpatient Follow up with lymphedema clinic Follow up with palliative services, per patient and family request  Nursing and Physical therapy Equipment/Devices:wheelchair  Discharge Condition:stable, improved CODE STATUS:  Code Status: Prior  Regular healthy diet  Brief/Interim Summary: Pt presented to ED with increased swelling of lower extremities that was resistant to outpatient diuresis. She was not SOB, hypoxic or experiencing chest pain on presentation. She also do not have increased pain in her Les. She has a known history of lymphedema which she is slightly worsened from baseline, per patient. She was admitted and started on IV lasix infusion as well as LE elevation which improved her symptoms. She was able to perform her baseline mobility with no oxygen desaturations. She was discharged on oral diuretics with follow up instructions for lymphedema treatment. Nephrology will follow up OP.  Discharge Diagnoses:  Principal Problem:   Acute on chronic diastolic CHF (congestive heart failure) (HCC) Active Problems:   CKD (chronic kidney disease), stage III (HCC)   Essential hypertension   Lymphedema   Obstructive sleep apnea   Primary osteoarthritis   Type 2 diabetes with nephropathy (HCC)   Superior mesenteric artery stenosis (HCC)   Nonrheumatic mitral (valve) insufficiency   Nonrheumatic aortic valve insufficiency   Lumbar stenosis with neurogenic claudication   Dyslipidemia   Dementia without behavioral disturbance (HCC)   Anemia in chronic kidney disease   Wound of right leg,  initial encounter   Discharge Instructions     Amb Referral to HF Clinic   Complete by: As directed       Allergies as of 10/25/2021       Reactions   Codeine Swelling   Lisinopril Swelling   Other reaction(s): Other angioedema   Prednisolone    Other reaction(s): Other Causes increased blood pressure        Medication List     STOP taking these medications    amLODipine 2.5 MG tablet Commonly known as: NORVASC       TAKE these medications    acetaminophen 500 MG tablet Commonly known as: TYLENOL Take 500 mg by mouth every 6 (six) hours as needed for pain.   atorvastatin 40 MG tablet Commonly known as: LIPITOR Take 40 mg by mouth at bedtime.   cholecalciferol 25 MCG (1000 UNIT) tablet Commonly known as: VITAMIN D3 Take 1,000 Units by mouth daily.   cloNIDine 0.2 MG tablet Commonly known as: CATAPRES Take 1 tablet (0.2 mg total) by mouth 2 (two) times daily.   Ferro-Sequels 65-25 MG Tbcr Generic drug: Ferrous Fumarate-Vitamin C ER Take 2 tablets by mouth at bedtime.   hydrALAZINE 25 MG tablet Commonly known as: APRESOLINE Take 25 mg by mouth 2 (two) times daily.   isosorbide mononitrate 60 MG 24 hr tablet Commonly known as: IMDUR Take 60 mg by mouth daily.   metolazone 2.5 MG tablet Commonly known as: ZAROXOLYN Take 1 tablet (2.5 mg total) by mouth once a week for 4 doses.   potassium chloride SA 20 MEQ tablet Commonly known as: KLOR-CON M Take 20-40 mEq by mouth See admin instructions. Take 2 tablets (52meq) by mouth every morning and take 1 tablet (  29meq) by mouth every afternoon   torsemide 20 MG tablet Commonly known as: DEMADEX Take 2-4 tablets (40-80 mg total) by mouth See admin instructions. Take 4 tablets (80mg ) by mouth every morning and take 2 tablets (40mg ) by mouth every afternoon   vitamin B-12 1000 MCG tablet Commonly known as: CYANOCOBALAMIN Take 1,000 mcg by mouth daily.        [place] Follow up.   Specialty: [place] Why: Referral for home health RN, PT, OT and SW accepted- they will reach out to schedule intial visit within the next 48 hours Contact information: [place] [address] [phone]         [employer] [facility] through [facility] Follow up.   Why: Wheelchair should be delivered to your home but it may take a couple of days- call customer service with questions Contact information: Customer Services [phone]               Allergies  Allergen Reactions   Codeine Swelling   Lisinopril Swelling    Other reaction(s): Other angioedema    Prednisolone     Other reaction(s): Other Causes increased blood pressure    Consultations: nephrology  Procedures/Studies: DG Chest 2 View  Result Date: 10/23/2021 CLINICAL DATA:  CHF EXAM: CHEST - 2 VIEW COMPARISON:  09/18/2021 FINDINGS: Cardiac enlargement without heart failure. Atherosclerotic calcification aortic arch Moderate elevation right hemidiaphragm unchanged. Right lower lobe airspace density unchanged. This is likely atelectasis given the elevated hemidiaphragm. Mild left lower lobe atelectasis unchanged. No significant pleural effusion. IMPRESSION: Cardiac enlargement without heart failure. Bibasilar airspace disease unchanged most likely atelectasis. Moderate elevation right hemidiaphragm unchanged. Electronically Signed   By: Franchot Gallo M.D.   On: 10/23/2021 17:41    Subjective: Patient denies chest pain, respiratory complaints, LE pain. Her swelling has improved since admission. She states that she has good urine output. Family would like palliative consult for OP.   Discharge Exam: Vitals:   10/25/21 0626 10/25/21 0945  BP: (!) 152/67 (!) 165/60  Pulse: 83 85  Resp: 20 (!) 22  Temp:    SpO2: 97% 98%   General: Pt is alert, awake, not in acute distress Cardiovascular: RRR, S1/S2 +, no rubs, no gallops Respiratory: CTA  bilaterally, no wheezing, no rhonchi Abdominal: Soft, NT, ND, bowel sounds + Extremities: non-pitting edema of entire Les symmetrically bilaterally  Labs: Basic Metabolic Panel: Recent Labs  Lab 10/23/21 1703 10/25/21 1005  NA 142 141  K 4.6 3.4*  CL 108 103  CO2 26 29  GLUCOSE 125* 261*  BUN 56* 53*  CREATININE 2.20* 1.97*  CALCIUM 9.3 8.2*  PHOS  --  3.8   CBC: Recent Labs  Lab 10/23/21 1703 10/25/21 1005  WBC 7.1 5.1  HGB 12.0 11.1*  HCT 38.1 35.3*  MCV 85.6 84.2  PLT 187 167    Microbiology Recent Results (from the past 240 hour(s))  Resp Panel by RT-PCR (Flu A&B, Covid) Nasopharyngeal Swab     Status: None   Collection Time: 10/24/21  8:23 AM   Specimen: Nasopharyngeal Swab; Nasopharyngeal(NP) swabs in vial transport medium  Result Value Ref Range Status   SARS Coronavirus 2 by RT PCR NEGATIVE NEGATIVE Final    Comment: (NOTE) SARS-CoV-2 target nucleic acids are NOT DETECTED.  The SARS-CoV-2 RNA is generally detectable in upper respiratory specimens during the acute phase of infection. The lowest concentration of  SARS-CoV-2 viral copies this assay can detect is 138 copies/mL. A negative result does not preclude SARS-Cov-2 infection and should not be used as the sole basis for treatment or other patient management decisions. A negative result may occur with  improper specimen collection/handling, submission of specimen other than nasopharyngeal swab, presence of viral mutation(s) within the areas targeted by this assay, and inadequate number of viral copies(<138 copies/mL). A negative result must be combined with clinical observations, patient history, and epidemiological information. The expected result is Negative.  Fact Sheet for Patients:  EntrepreneurPulse.com.au  Fact Sheet for Healthcare Providers:  IncredibleEmployment.be  This test is no t yet approved or cleared by the [place] FDA and  has been  authorized for detection and/or diagnosis of SARS-CoV-2 by FDA under an Emergency Use Authorization (EUA). This EUA will remain  in effect (meaning this test can be used) for the duration of the COVID-19 declaration under Section 564(b)(1) of the Act, 21 U.S.C.section 360bbb-3(b)(1), unless the authorization is terminated  or revoked sooner.       Influenza A by PCR NEGATIVE NEGATIVE Final   Influenza B by PCR NEGATIVE NEGATIVE Final    Comment: (NOTE) The Xpert Xpress SARS-CoV-2/FLU/RSV plus assay is intended as an aid in the diagnosis of influenza from Nasopharyngeal swab specimens and should not be used as a sole basis for treatment. Nasal washings and aspirates are unacceptable for Xpert Xpress SARS-CoV-2/FLU/RSV testing.  Fact Sheet for Patients: EntrepreneurPulse.com.au  Fact Sheet for Healthcare Providers: IncredibleEmployment.be  This test is not yet approved or cleared by the [place] FDA and has been authorized for detection and/or diagnosis of SARS-CoV-2 by FDA under an Emergency Use Authorization (EUA). This EUA will remain in effect (meaning this test can be used) for the duration of the COVID-19 declaration under Section 564(b)(1) of the Act, 21 U.S.C. section 360bbb-3(b)(1), unless the authorization is terminated or revoked.  Performed at [facility], [address] [place],  [address]     Time coordinating discharge: Over 30 minutes  Richarda Osmond, MD  [facility] 10/26/2021, 1:55 PM

## 2021-10-25 NOTE — Discharge Instructions (Signed)
Please keep your legs elevated while at home and use compression wrap/stockings for swelling.  Take your medications as directed- sent to your pharmacy for further diuresis Follow up with kidney doctor outpatient

## 2021-10-25 NOTE — Progress Notes (Incomplete)
PROGRESS NOTE  [name] [name]    DOB: 17-Sep-1925, 88 y.o.  [id number]  PCP: Melba Coon, MD   Code Status: Full Code   DOA: 10/24/2021   LOS: 1  Brief Narrative of Current Hospitalization  [name] [name] is a 88 y.o. female with a PMH significant for ***. They presented from *** to the ED on 10/24/2021 with *** x***days. In the ED, it was found that they had ***. They were treated with ***.  Patient was admitted to medicine service for further workup and management of *** as outlined in detail below.  10/25/21 -***  Assessment & Plan  Principal Problem:   Acute on chronic diastolic CHF (congestive heart failure) (HCC) Active Problems:   CKD (chronic kidney disease), stage III (HCC)   Essential hypertension   Lymphedema   Obstructive sleep apnea   Primary osteoarthritis   Type 2 diabetes with nephropathy (HCC)   Superior mesenteric artery stenosis (HCC)   Nonrheumatic mitral (valve) insufficiency   Nonrheumatic aortic valve insufficiency   Lumbar stenosis with neurogenic claudication   Dyslipidemia   Dementia without behavioral disturbance (HCC)   Anemia in chronic kidney disease   Wound of right leg, initial encounter    Acute on chronic diastolic CHF (congestive heart failure) ([place]) Failed outpatient management and also failed intermittent IV furosemide ordered through the outpatient center.  Patient is not short of breath when sitting but has fairly severe dyspnea with any exertion.  Nephrologist recommends IV furosemide drip. Echocardiogram: September 2022: EF 50-55% CHF order set.  Lasix IV drip. No Beta blocker on admission: there is an indication patient had bradycardia in the past and that is likely the reason her cardiologist does not having patient taking a beta blocker. Pt just had a visit with cardiologist and need to assume patient was felt not to be a candidate for a beta blocker.  No ACE due to history of angioedema. Strict I/O. 2g Na diet. Oxygen support  by nasal cannula as needed. Instruct patient regarding CHF diagnosis. Instruct patient to weigh self daily and to contact doctor's office and/or follow doctor's plan if there is a weight gain of 2-3 lbs in 1 day or 5 lbs in 3-7 days. Provide teaching regarding 2g Na diet. Discussed with the patient and her granddaughter the importance of taking her home torsemide as prescribed when she goes home. Nephrologist consulted.   Essential hypertension Blood pressures elevated upon admission but patient has not had her home blood pressure medications. Plan: Continue home medications including amlodipine 5 mg daily, hydralazine 25 mg twice a day, Imdur 60 mg daily, clonidine 0.2 mg twice a day.If still with elevated blood pressure will give PRN medication.      Type 2 diabetes with nephropathy, without long-term current use of insulin (HCC) Diet controlled. Plan: Recheck glucose in the AM. Will hold off on sliding scale insulin and finger stick glucose checks initially due to diet controlled DM.     CKD (chronic kidney disease), stage III (HCC) Likely due to hypertension and diabetes.  Followed by nephrologist. Plan: Avoid nephrotoxins.  Monitor I/O Nephrology consult.     Obstructive sleep apnea She stopped using CPAP long ago because she could not find a mask she could tolerate. Plan: Encouraged patient to follow-up outpatient with primary care to see if she could be tried on nasal prong apparatus.     Dyslipidemia Plan: Statin.     Anemia in chronic kidney disease Plan: monitor for any bleeding. Recheck CBC  Lymphedema Plan: We will need outpatient follow-up   Superficial wound of the right lower leg, initial encounter Could be related to her lymphedema Plan: Wound care consult..      Superior mesenteric artery stenosis ([place]) Diagnosed previously. Plan: Outpatient follow up with primary care.     Nonrheumatic mitral (valve) insufficiency and    Nonrheumatic aortic valve  insufficiency Plan: Monitor I/O. Follow up outpatient with primary care and/or cardiology.     Lumbar stenosis with neurogenic claudication Plan: Encourage ambulation. PRN pain medications. Outpatient follow up.     Primary osteoarthritis Plan: Pain medications as needed and outpatient follow-up.     Dementia without behavioral disturbance ([place]) Plan: Scheduled melatonin. Risk of delirium while hospitalized. Follow up outpatient.   DVT prophylaxis: enoxaparin (LOVENOX) injection 30 mg Start: 10/24/21 2200 SCDs Start: 10/24/21 1418 Place and maintain sequential compression device Start: 10/24/21 0819   Diet:  Diet Orders (From admission, onward)     Start     Ordered   10/24/21 1418  Diet 2 gram sodium Room service appropriate? Yes; Fluid consistency: Thin  Diet effective now       Question Answer Comment  Room service appropriate? Yes   Fluid consistency: Thin      10/24/21 1422            Subjective 10/25/21    Pt reports ***  Disposition Plan & Communication  Patient status: Inpatient  Admitted From: {From:23814} Disposition: {PLAN; DISPOSITION:26386} Anticipated discharge date: ***  Family Communication: ***  Consults, Procedures, Significant Events  Consultants:  ***  Procedures/significant events:  None***  Antimicrobials:  Anti-infectives (From admission, onward)    None       Objective   Vitals:   10/24/21 2233 10/25/21 0140 10/25/21 0422 10/25/21 0626  BP: (!) 159/67 (!) 156/60 (!) 158/74 (!) 152/67  Pulse: 77 75 72 83  Resp: (!) 22 20 20 20   Temp:      TempSrc:      SpO2: 100% 98% 95% 97%  Weight:      Height:        Intake/Output Summary (Last 24 hours) at 10/25/2021 0745 Last data filed at 10/25/2021 0422 Gross per 24 hour  Intake --  Output 1250 ml  Net -1250 ml   Filed Weights   10/23/21 1659  Weight: 81.6 kg    Patient BMI: Body mass index is 35.15 kg/m.   Physical Exam: *** General: awake, alert, NAD HEENT: atraumatic,  clear conjunctiva, anicteric sclera, MMM, hearing grossly normal Respiratory: normal respiratory effort. Cardiovascular: normal S1/S2, RRR, no JVD, murmurs, quick capillary refill  Gastrointestinal: soft, NT, ND Nervous: A&O x3. no gross focal neurologic deficits, normal speech Extremities: moves all equally, no edema, normal tone Skin: dry, intact, normal temperature, normal color. No rashes, lesions or ulcers on exposed skin Psychiatry: normal mood, congruent affect  Labs   I have personally reviewed following labs and imaging studies Admission on 10/24/2021  Component Date Value Ref Range Status   WBC 10/23/2021 7.1  4.0 - 10.5 K/uL Final   RBC 10/23/2021 4.45  3.87 - 5.11 MIL/uL Final   Hemoglobin 10/23/2021 12.0  12.0 - 15.0 g/dL Final   HCT 10/23/2021 38.1  36.0 - 46.0 % Final   MCV 10/23/2021 85.6  80.0 - 100.0 fL Final   MCH 10/23/2021 27.0  26.0 - 34.0 pg Final   MCHC 10/23/2021 31.5  30.0 - 36.0 g/dL Final   RDW 10/23/2021 17.5 (H)  11.5 - 15.5 %  Final   Platelets 10/23/2021 187  150 - 400 K/uL Final   nRBC 10/23/2021 0.0  0.0 - 0.2 % Final   Sodium 10/23/2021 142  135 - 145 mmol/L Final   Potassium 10/23/2021 4.6  3.5 - 5.1 mmol/L Final   Chloride 10/23/2021 108  98 - 111 mmol/L Final   CO2 10/23/2021 26  22 - 32 mmol/L Final   Glucose, Bld 10/23/2021 125 (H)  70 - 99 mg/dL Final   BUN 10/23/2021 56 (H)  8 - 23 mg/dL Final   Creatinine, Ser 10/23/2021 2.20 (H)  0.44 - 1.00 mg/dL Final   Calcium 10/23/2021 9.3  8.9 - 10.3 mg/dL Final   Total Protein 10/23/2021 7.8  6.5 - 8.1 g/dL Final   Albumin 10/23/2021 3.6  3.5 - 5.0 g/dL Final   AST 10/23/2021 38  15 - 41 U/L Final   ALT 10/23/2021 31  0 - 44 U/L Final   Alkaline Phosphatase 10/23/2021 64  38 - 126 U/L Final   Total Bilirubin 10/23/2021 0.9  0.3 - 1.2 mg/dL Final   GFR, Estimated 10/23/2021 20 (L)  >60 mL/min Final   Anion gap 10/23/2021 8  5 - 15 Final   B Natriuretic Peptide 10/23/2021 488.9 (H)  0.0 - 100.0  pg/mL Final   Troponin I (High Sensitivity) 10/23/2021 89 (H)  <18 ng/L Final   SARS Coronavirus 2 by RT PCR 10/24/2021 NEGATIVE  NEGATIVE Final   Influenza A by PCR 10/24/2021 NEGATIVE  NEGATIVE Final   Influenza B by PCR 10/24/2021 NEGATIVE  NEGATIVE Final    Imaging Studies  DG Chest 2 View  Result Date: 10/23/2021 CLINICAL DATA:  CHF EXAM: CHEST - 2 VIEW COMPARISON:  09/18/2021 FINDINGS: Cardiac enlargement without heart failure. Atherosclerotic calcification aortic arch Moderate elevation right hemidiaphragm unchanged. Right lower lobe airspace density unchanged. This is likely atelectasis given the elevated hemidiaphragm. Mild left lower lobe atelectasis unchanged. No significant pleural effusion. IMPRESSION: Cardiac enlargement without heart failure. Bibasilar airspace disease unchanged most likely atelectasis. Moderate elevation right hemidiaphragm unchanged. Electronically Signed   By: Franchot Gallo M.D.   On: 10/23/2021 17:41    Medications   Scheduled Meds:  amLODipine  5 mg Oral Daily   atorvastatin  40 mg Oral QHS   cholecalciferol  1,000 Units Oral Daily   cloNIDine  0.2 mg Oral BID   enoxaparin (LOVENOX) injection  30 mg Subcutaneous Q24H   Ferrous Fumarate  1 tablet Oral QHS   hydrALAZINE  25 mg Oral BID   isosorbide mononitrate  60 mg Oral Daily   potassium chloride SA  20 mEq Oral TID   vitamin B-12  1,000 mcg Oral Daily   No recently discontinued medications to reconcile  LOS: 1 day   Richarda Osmond, DO [facility] 10/25/2021, 7:45 AM   Available by Epic secure chat 7AM-7PM. If 7PM-7AM, please contact night-coverage Refer to [email or web address] to contact the [facility] Attending or Consulting provider for this pt

## 2021-10-25 NOTE — ED Notes (Signed)
This RN updated family. Awaiting response from Dr. Ouida Sills. Will update family when update is available.

## 2021-10-31 ENCOUNTER — Telehealth: Payer: Self-pay | Admitting: Primary Care

## 2021-10-31 NOTE — Telephone Encounter (Signed)
Spoke with patient's daughter Olin Hauser, regarding the Palliative referral/services and all questions were answered and she was in agreement with scheduling visit.  I have scheduled an In-home Consult for 11/05/21 @ 1 PM

## 2021-11-05 ENCOUNTER — Ambulatory Visit
Admission: RE | Admit: 2021-11-05 | Discharge: 2021-11-05 | Disposition: A | Discharge status: Home / Self Care | Payer: Medicare PPO | Source: Ambulatory Visit | Attending: Nephrology | Admitting: Nephrology

## 2021-11-05 ENCOUNTER — Other Ambulatory Visit: Payer: Medicare PPO | Admitting: Primary Care

## 2021-11-05 DIAGNOSIS — R609 Edema, unspecified: Secondary | ICD-10-CM | POA: Diagnosis not present

## 2021-11-05 DIAGNOSIS — N184 Chronic kidney disease, stage 4 (severe): Secondary | ICD-10-CM | POA: Diagnosis not present

## 2021-11-05 MED ORDER — FUROSEMIDE 10 MG/ML IJ SOLN
INTRAMUSCULAR | Status: AC
  Administered 2021-11-05: 60 mg
  Filled 2021-11-05: qty 8

## 2021-11-05 MED ORDER — FUROSEMIDE 10 MG/ML IJ SOLN: 60.0000 mg | Freq: Once | INTRAMUSCULAR | Status: DC

## 2021-11-05 NOTE — Discharge Instructions (Signed)
Appointments on Wednesday and Friday of this week at 10am here in same day surgery. Contact physician for any worsening of symptoms.

## 2021-11-06 ENCOUNTER — Other Ambulatory Visit: Payer: Medicare PPO | Admitting: Primary Care

## 2021-11-06 ENCOUNTER — Ambulatory Visit: Payer: Medicare PPO | Admitting: Family

## 2021-11-06 ENCOUNTER — Other Ambulatory Visit: Payer: Self-pay

## 2021-11-06 DIAGNOSIS — I5032 Chronic diastolic (congestive) heart failure: Secondary | ICD-10-CM

## 2021-11-06 DIAGNOSIS — Z515 Encounter for palliative care: Secondary | ICD-10-CM

## 2021-11-06 DIAGNOSIS — I890 Lymphedema, not elsewhere classified: Secondary | ICD-10-CM

## 2021-11-06 DIAGNOSIS — S81801A Unspecified open wound, right lower leg, initial encounter: Secondary | ICD-10-CM

## 2021-11-06 DIAGNOSIS — F0390 Unspecified dementia without behavioral disturbance: Secondary | ICD-10-CM

## 2021-11-06 DIAGNOSIS — Z7409 Other reduced mobility: Secondary | ICD-10-CM

## 2021-11-06 NOTE — Progress Notes (Signed)
[facility] Palliative Care Consult Note Telephone: [phone]  Fax: [phone]   Date of encounter: 11/06/21 2:44 PM PATIENT NAME: [name] [name] [address] [place] [address]   [phone] (home)  DOB: 1925-08-16 MRN: [id number] PRIMARY CARE PROVIDER:    Melba Coon, MD,  [address] [place] [address] [phone]  REFERRING PROVIDER:   Melba Coon, MD [address] [place],  [place] [address] [phone]  RESPONSIBLE PARTY:    Contact Information     Name Relation Home Work Mobile   Rich,Pamela Daughter [phone]  [phone]       I met face to face with patient and family in  home. Palliative Care was asked to follow this patient by consultation request of  [name], [name], MD to address advance care planning and complex medical decision making. This is the initial visit.                                     ASSESSMENT AND PLAN / RECOMMENDATIONS:   Advance Care Planning/Goals of Care: Goals include to maximize quality of life and symptom management. Patient/health care surrogate gave his/her permission to discuss.Our advance care planning conversation included a discussion about:    The value and importance of advance care planning Exploration of personal, cultural or spiritual beliefs that might influence medical decisions  Exploration of goals of care in the event of a sudden injury or illness  Identification of a healthcare agent - granddaughter Review  of an  advance directive document - did not want to discuss at this time. MOST left for their review. CODE STATUS: FULL  Symptom Management/Plan:  Skin integrity: Using zinc and triamcinolone on rash on buttock and groin. No break down noted in buttock folds. Does have 2 cm round stasis ulcer on R LE. Needs foam dressing with compression, has [facility]  who can f/u with orders. To see Wound clinic   initially on Friday, will defer to them for orders.  Pain in bil buttock area could be due to elastic of compression stockings, impinging.  CHF:  Endorses eating salt some, likes biscuits and fried foods.  Discussion RE sodium and water gain. Known to cardiology in [place] reported is 178 lbs, goal is 165 lbs (dry weight). Has 4++ bil edema and moderate ascites.  ADLs: gets in shower, sleeps in chair but will use bed to elevate legs. Uses BSC. Has OT with [facility] working on [employer]. Granddaughter is care giver.    Immobility: Using walker to stand, has lift chair. Has been sleeping in chair, granddaughter endorses needing to use bed for better edema management. Pt also has lymphedema pumps used 1-2 x daily. Can stand with lift chair but endorses pain with position changes.  Follow up Palliative Care Visit: Palliative care will continue to follow for complex medical decision making, advance care planning, and clarification of goals. Return 4-6 weeks or prn.  This visit was coded based on medical decision making (MDM).  PPS: 40%  HOSPICE ELIGIBILITY/DIAGNOSIS: TBD  Chief Complaint: edema  HISTORY OF PRESENT ILLNESS:  [name] [name] is a 88 y.o. year old female  with CHF, 4+ edema, ascites, dementia presents to palliative medicine for goals of care, sx medical management .  Has wound on R LE, and 4+ edema with 15-20 mmHg compression currently. Needs more compression and Rx for wound.  History obtained from review of EMR, discussion with primary team, and interview with family, facility staff/caregiver and/or Ms. Kizzie Ide.  I reviewed available labs, medications, imaging, studies and related documents from the EMR.  Records reviewed and summarized above.   ROS   General: NAD EYES: denies vision changes, has glasses ENMT: denies dysphagia Cardiovascular: denies chest pain, endorses some DOE Pulmonary: denies cough, denies increased SOB Abdomen: endorses good appetite, endorses occ   constipation, endorses continence of bowel GU: denies dysuria, endorses continence of urine MSK:  endorses increased weakness,  no falls reported Skin: endorse R LE wound, bil buttock crease soreness, groin rash Neurological: denies pain, denies insomnia Psych: Endorses positive mood Heme/lymph/immuno: denies bruises, abnormal bleeding  Physical Exam: Current and past weights: 178 lbs reported Constitutional: NAD General: frail appearing, obese  EYES: anicteric sclera, lids intact, no discharge  ENMT: intact hearing, oral mucous membranes moist, dentition intact CV: S1S2, RRR, +4  LE edema Pulmonary: LCTA, no increased work of breathing, no cough, room air Abdomen: intake 100%, firm  soft and non tender, + ascites GU: deferred MSK: + sarcopenia, moves all extremities, ambulatory with walker and stand by Skin: 2 cm round stasis wound R LE, no buttock skin break down Neuro:  + generalized weakness,  + cognitive impairment Psych: non-anxious affect, A and O x 2 Hem/lymph/immuno: no widespread bruising CURRENT PROBLEM LIST:  Patient Active Problem List   Diagnosis Date Noted   AKI (acute kidney injury) (HCC)    Acute on chronic diastolic CHF (congestive heart failure) (HCC) 10/24/2021   Wound of right leg, initial encounter 10/24/2021   SOB (shortness of breath)    Acute exacerbation of CHF (congestive heart failure) (HCC) 07/01/2021   CKD (chronic kidney disease), stage III (HCC) 07/01/2021   Essential hypertension 07/01/2021   Lymphedema 07/01/2021   Obstructive sleep apnea 07/01/2021   GERD (gastroesophageal reflux disease) 07/01/2021   Insomnia 07/01/2021   Primary osteoarthritis 07/01/2021   Angioedema 07/01/2021   Diverticulosis 06/02/2021   Impaired mobility 03/01/2021   Dementia without behavioral disturbance (HCC) 03/01/2021   History of GI bleed 01/04/2021   Hypokalemia 07/08/2018   Dyslipidemia 07/08/2018   Anemia in chronic kidney disease 07/08/2018   Iron  deficiency 09/28/2017   Unspecified diastolic (congestive) heart failure (HCC) 06/19/2017   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   Lumbar stenosis with neurogenic claudication 07/10/2015   Vitamin D deficiency 03/31/2015   Nonrheumatic mitral (valve) insufficiency 09/29/2014   Nonrheumatic aortic valve insufficiency 09/29/2014   Vitamin B12 deficiency 01/31/2014   Type 2 diabetes with nephropathy (HCC) 09/28/2013   Type 2 diabetes mellitus with diabetic chronic kidney disease (HCC) 09/28/2013   DDD (degenerative disc disease), lumbosacral 04/06/2012   PAST MEDICAL HISTORY:  Active Ambulatory Problems    Diagnosis Date Noted   Acute exacerbation of CHF (congestive heart failure) (HCC) 07/01/2021   CKD (chronic kidney disease), stage III (HCC) 07/01/2021   Essential hypertension 07/01/2021   Lymphedema 07/01/2021   Obstructive sleep apnea 07/01/2021   GERD (gastroesophageal reflux disease) 07/01/2021   Insomnia 07/01/2021   Primary osteoarthritis 07/01/2021   Angioedema 07/01/2021   SOB (shortness of breath)    Acute on chronic diastolic CHF (congestive heart failure) (HCC) 10/24/2021   Vitamin B12 deficiency 01/31/2014   Vitamin D deficiency 03/31/2015   Unspecified diastolic (congestive) heart failure (HCC) 06/19/2017   Type 2 diabetes with nephropathy (HCC) 09/28/2013   Type 2 diabetes mellitus with diabetic chronic kidney disease (HCC) 09/28/2013  Superior mesenteric artery stenosis ([place]) 12/20/2016   DDD (degenerative disc disease), lumbosacral 04/06/2012   Nonrheumatic mitral (valve) insufficiency 09/29/2014   Nonrheumatic aortic valve insufficiency 09/29/2014   Lumbar stenosis with neurogenic claudication 07/10/2015   Iron deficiency 09/28/2017   Impaired mobility 03/01/2021   Hypokalemia 07/08/2018   History of GI bleed 01/04/2021   Dyslipidemia 07/08/2018   Diverticulosis 06/02/2021   Dementia without behavioral disturbance ([place]) 03/01/2021   Anemia  in chronic kidney disease 07/08/2018   Wound of right leg, initial encounter 10/24/2021   AKI (acute kidney injury) [facility])    Resolved Ambulatory Problems    Diagnosis Date Noted   No Resolved Ambulatory Problems   Past Medical History:  Diagnosis Date   Anemia    CHF (congestive heart failure) (HCC)    Chronic kidney disease    Dementia (HCC)    Diabetes mellitus without complication (HCC)    GIB (gastrointestinal bleeding)    Hyperlipidemia    Hypertension    OSA on CPAP    Osteoarthritis    SOCIAL HX:  Social History   Tobacco Use   Smoking status: Never   Smokeless tobacco: Never  Substance Use Topics   Alcohol use: Never   FAMILY HX:  Family History  Problem Relation Age of Onset   Kidney disease Mother    Congestive Heart Failure Mother    Lung cancer Daughter       ALLERGIES:  Allergies  Allergen Reactions   Codeine Swelling   Lisinopril Swelling    Other reaction(s): Other angioedema    Prednisolone     Other reaction(s): Other Causes increased blood pressure     PERTINENT MEDICATIONS:  Outpatient Encounter Medications as of 11/06/2021  Medication Sig   acetaminophen (TYLENOL) 500 MG tablet Take 500 mg by mouth every 6 (six) hours as needed for pain.   atorvastatin (LIPITOR) 40 MG tablet Take 40 mg by mouth at bedtime.   cholecalciferol (VITAMIN D3) 25 MCG (1000 UNIT) tablet Take 1,000 Units by mouth daily.   cloNIDine (CATAPRES) 0.2 MG tablet Take 1 tablet (0.2 mg total) by mouth 2 (two) times daily.   Ferrous Fumarate-Vitamin C ER (FERRO-SEQUELS) 65-25 MG TBCR Take 2 tablets by mouth at bedtime.   hydrALAZINE (APRESOLINE) 25 MG tablet Take 25 mg by mouth 2 (two) times daily.   isosorbide mononitrate (IMDUR) 60 MG 24 hr tablet Take 60 mg by mouth daily.   metolazone (ZAROXOLYN) 2.5 MG tablet Take 1 tablet (2.5 mg total) by mouth once a week for 4 doses.   potassium chloride SA (KLOR-CON M) 20 MEQ tablet Take 20-40 mEq by mouth See admin  instructions. Take 2 tablets (11meq) by mouth every morning and take 1 tablet (68meq) by mouth every afternoon   torsemide (DEMADEX) 20 MG tablet Take 2-4 tablets (40-80 mg total) by mouth See admin instructions. Take 4 tablets ($RemoveBe'80mg'luUwWyFhn$ ) by mouth every morning and take 2 tablets ($RemoveBe'40mg'JwckddhUI$ ) by mouth every afternoon   vitamin B-12 (CYANOCOBALAMIN) 1000 MCG tablet Take 1,000 mcg by mouth daily.   No facility-administered encounter medications on file as of 11/06/2021.   Thank you for the opportunity to participate in the care of [name].  The palliative care team will continue to follow. Please call our office at [phone] if we can be of additional assistance.   Jason Coop, NP , DNP, AGPCNP-BC  COVID-19 PATIENT SCREENING TOOL Asked and negative response unless otherwise noted:  Have you had symptoms of covid, tested positive  or been in contact with someone with symptoms/positive test in the past 5-10 days?

## 2021-11-07 ENCOUNTER — Ambulatory Visit
Admission: RE | Admit: 2021-11-07 | Discharge: 2021-11-07 | Disposition: A | Discharge status: Home / Self Care | Payer: Medicare PPO | Source: Ambulatory Visit | Attending: Nephrology | Admitting: Nephrology

## 2021-11-07 ENCOUNTER — Other Ambulatory Visit: Payer: Self-pay

## 2021-11-07 DIAGNOSIS — R609 Edema, unspecified: Secondary | ICD-10-CM | POA: Insufficient documentation

## 2021-11-07 DIAGNOSIS — N184 Chronic kidney disease, stage 4 (severe): Secondary | ICD-10-CM | POA: Diagnosis not present

## 2021-11-07 MED ORDER — FUROSEMIDE 10 MG/ML IJ SOLN
60.0000 mg | Freq: Once | INTRAMUSCULAR | Status: AC
  Administered 2021-11-07: 60 mg via INTRAVENOUS

## 2021-11-09 ENCOUNTER — Encounter: Payer: Medicare PPO | Attending: Internal Medicine | Admitting: Internal Medicine

## 2021-11-09 ENCOUNTER — Ambulatory Visit
Admission: RE | Admit: 2021-11-09 | Discharge: 2021-11-09 | Disposition: A | Discharge status: Home / Self Care | Payer: Medicare PPO | Source: Ambulatory Visit | Attending: Nephrology | Admitting: Nephrology

## 2021-11-09 ENCOUNTER — Other Ambulatory Visit: Payer: Self-pay

## 2021-11-09 DIAGNOSIS — I5032 Chronic diastolic (congestive) heart failure: Secondary | ICD-10-CM | POA: Insufficient documentation

## 2021-11-09 DIAGNOSIS — N184 Chronic kidney disease, stage 4 (severe): Secondary | ICD-10-CM | POA: Insufficient documentation

## 2021-11-09 DIAGNOSIS — I130 Hypertensive heart and chronic kidney disease with heart failure and stage 1 through stage 4 chronic kidney disease, or unspecified chronic kidney disease: Secondary | ICD-10-CM | POA: Diagnosis not present

## 2021-11-09 DIAGNOSIS — E11622 Type 2 diabetes mellitus with other skin ulcer: Secondary | ICD-10-CM | POA: Diagnosis not present

## 2021-11-09 DIAGNOSIS — I890 Lymphedema, not elsewhere classified: Secondary | ICD-10-CM | POA: Insufficient documentation

## 2021-11-09 DIAGNOSIS — E1142 Type 2 diabetes mellitus with diabetic polyneuropathy: Secondary | ICD-10-CM | POA: Insufficient documentation

## 2021-11-09 DIAGNOSIS — R609 Edema, unspecified: Secondary | ICD-10-CM | POA: Diagnosis present

## 2021-11-09 DIAGNOSIS — E1122 Type 2 diabetes mellitus with diabetic chronic kidney disease: Secondary | ICD-10-CM | POA: Insufficient documentation

## 2021-11-09 DIAGNOSIS — L97211 Non-pressure chronic ulcer of right calf limited to breakdown of skin: Secondary | ICD-10-CM | POA: Insufficient documentation

## 2021-11-09 MED ORDER — FUROSEMIDE 10 MG/ML IJ SOLN
60.0000 mg | Freq: Once | INTRAMUSCULAR | Status: AC
  Administered 2021-11-09: 60 mg via INTRAVENOUS

## 2021-11-09 NOTE — Progress Notes (Signed)
ZYA, FINKLE ([id number]) Visit Report for 11/09/2021 Debridement Details Patient Name: [name] [name], [name] [name] Date of Service: 11/09/2021 10:00 AM Medical Record Number: [id number] Patient Account Number: [id number] Date of Birth/Sex: [birth year] (88 y.o. Female) Treating RN: Donnamarie Poag Primary Care Provider: Melba Coon Other Clinician: Referring Provider: Referral, Self Treating Provider/Extender: Ricard Dillon Weeks in Treatment: 0 Debridement Performed for Wound #1 Right Lower Leg Assessment: Performed By: Physician Ricard Dillon, MD Debridement Type: Chemical/Enzymatic/Mechanical Agent Used: Gauze and saline Severity of Tissue Pre Debridement: Limited to breakdown of skin Level of Consciousness (Pre- Awake and Alert procedure): Pre-procedure Verification/Time Out Yes - 10:45 Taken: Start Time: 10:46 Pain Control: Lidocaine Instrument: Other : saline gauze Bleeding: None Response to Treatment: Procedure was tolerated well Level of Consciousness (Post- Awake and Alert procedure): Post Debridement Measurements of Total Wound Length: (cm) 1.1 Width: (cm) 0.8 Depth: (cm) 0.1 Volume: (cm) 0.069 Character of Wound/Ulcer Post Debridement: Improved Severity of Tissue Post Debridement: Limited to breakdown of skin Post Procedure Diagnosis Same as Pre-procedure Electronic Signature(s) Signed: 11/09/2021 11:21:01 AM By: Donnamarie Poag Signed: 11/09/2021 3:34:05 PM By: Linton Ham MD Entered By: Linton Ham on 11/09/2021 10:54:52 [name] [name], [name] [name] ([id number]) -------------------------------------------------------------------------------- HPI Details Patient Name: [name] [name] Date of Service: 11/09/2021 10:00 AM Medical Record Number: [id number] Patient Account Number: [id number] Date of Birth/Sex: Jul 23, 1925 (88 y.o. Female) Treating RN: Donnamarie Poag Primary Care Provider: Melba Coon Other Clinician: Referring Provider: Referral, Self Treating  Provider/Extender: Ricard Dillon Weeks in Treatment: 0 History of Present Illness HPI Description: ADMISSION 11/09/2021; this is a 88 year old woman who was accompanied by her caregiver who is her granddaughter. She has had a wound on her medial right calf since the beginning of December. Not exactly clear how this started. This was recently seen by palliative care and I think referred here. She has [facility] home health they have been using Vaseline gauze and her own stockings. She has chronic lymphedema and has had this for a number of years she has thigh-high compression stockings. She also has an appointment with Dr. Delana Meyer on Monday at [facility] vein and vascular. She was recently hospitalized for acute on chronic diastolic heart failure and currently is getting IV Lasix as an outpatient 5 days a week. According to granddaughter her weight is come down nicely. Past medical history includes type 2 diabetes with peripheral neuropathy, hypertension, chronic diastolic congestive heart failure, stage IV chronic kidney disease ABI was very difficult to get in our clinic but we got a very low value of 0.48. The patient does not easily complain of claudication or pain in the leg Electronic Signature(s) Signed: 11/09/2021 3:34:05 PM By: Linton Ham MD Entered By: Linton Ham on 11/09/2021 10:57:03 [name] [name], [name] [name] ([id number]) -------------------------------------------------------------------------------- Physical Exam Details Patient Name: [name] [name] Date of Service: 11/09/2021 10:00 AM Medical Record Number: [id number] Patient Account Number: [id number] Date of Birth/Sex: [birth year] (88 y.o. Female) Treating RN: Donnamarie Poag Primary Care Provider: Melba Coon Other Clinician: Referring Provider: Referral, Self Treating Provider/Extender: Ricard Dillon Weeks in Treatment: 0 Constitutional Patient is hypertensive.. Pulse regular and within target range for patient.Marland Kitchen  Respirations regular, non-labored and within target range.. Temperature is normal and within the target range for the patient.Marland Kitchen appears in no distress. Respiratory Above normal respiratory effort noted. Respiratory rate elveaated. Bilateral crackles and bronchial breath sounds. Cardiovascular There is no murmurs. Jugular venous pressure does not appear to be elevated no sacral edema. Pedal pulses on the right are very difficult to feel either at the dorsalis pedis or  posterior tibial. Very significant nonpitting edema in the foot and ankle. Nonpitting edema up to the right calf and may extend into the thigh. Notes Wound exam; the patient has a very small area on the right medial lower calf actually looks quite good a lot of this is already epithelialized only a very small fragile area remains Electronic Signature(s) Signed: 11/09/2021 3:34:05 PM By: Linton Ham MD Entered By: Linton Ham on 11/09/2021 10:59:28 [name] [name] ([id number]) -------------------------------------------------------------------------------- Physician Orders Details Patient Name: [name] [name] Date of Service: 11/09/2021 10:00 AM Medical Record Number: [id number] Patient Account Number: [id number] Date of Birth/Sex: May 26, 1925 (88 y.o. Female) Treating RN: Donnamarie Poag Primary Care Provider: Melba Coon Other Clinician: Referring Provider: Referral, Self Treating Provider/Extender: Tito Dine in Treatment: 0 Verbal / Phone Orders: No Diagnosis Coding Follow-up Appointments o Return Appointment in 1 week. o Nurse Visit as needed [place]: [facility] [place] for wound care. May utilize formulary equivalent dressing for wound treatment orders unless otherwise specified. Home Health Nurse may visit PRN to address patientos wound care needs. o Scheduled days for dressing changes to be completed; exception, patient has scheduled wound care  visit that day. o **Please direct any NON-WOUND related issues/requests for orders to patient's Primary Care Physician. **If current dressing causes regression in wound condition, may D/C ordered dressing product/s and apply Normal Saline Moist Dressing daily until next [place] or Other MD appointment. **Notify Wound Healing Center of regression in wound condition at [phone]. Bathing/ Shower/ Hygiene o May shower; gently cleanse wound with antibacterial soap, rinse and pat dry prior to dressing wounds Anesthetic (Use 'Patient Medications' Section for Anesthetic Order Entry) o Lidocaine applied to wound bed Edema Control - Lymphedema / Segmental Compressive Device / Other o Patient to wear own compression stockings. Remove compression stockings every night before going to bed and put on every morning when getting up. - bilateral legs o DO YOUR BEST to sleep in the bed at night. DO NOT sleep in your recliner. Long hours of sitting in a recliner leads to swelling of the legs and/or potential wounds on your backside. Non-Wound Condition o Additional non-wound orders/instructions: - Use good moisture lotion Cetaphil or Cerave to legs at night after washing Additional Orders / Instructions o Follow Nutritious Diet and Increase Protein Intake Wound Treatment Wound #1 - Lower Leg Wound Laterality: Right Cleanser: Soap and Water Every Other Day/15 Days Discharge Instructions: Gently cleanse wound with antibacterial soap, rinse and pat dry prior to dressing wounds Cleanser: Wound Cleanser Every Other Day/15 Days Discharge Instructions: Wash your hands with soap and water. Remove old dressing, discard into plastic bag and place into trash. Cleanse the wound with Wound Cleanser prior to applying a clean dressing using gauze sponges, not tissues or cotton balls. Do not scrub or use excessive force. Pat dry using gauze sponges, not tissue or cotton balls. Primary Dressing:  Hydrofera Blue Ready Transfer Foam, 2.5x2.5 (in/in) Every Other Day/15 Days Discharge Instructions: Apply Hydrofera Blue Ready to wound bed as directed Secondary Dressing: Zetuvit Plus Silicone Border Dressing 4x4 (in/in) Every Other Day/15 Days Notes Keep appt with  Vein and Vascular 11/12/21 Continue to follow with cardiology and nephrology as scheduled with extensive CHF Electronic Signature(s) Signed: 11/09/2021 11:21:01 AM By: Donnamarie Poag Signed: 11/09/2021 3:34:05 PM By: Linton Ham MD [name] [name], [name] [name] ([id number]) Entered By: Donnamarie Poag on 11/09/2021 10:52:28 [name] [name] ([id number]) -------------------------------------------------------------------------------- Problem List Details Patient Name: [name] [name] Date  of Service: 11/09/2021 10:00 AM Medical Record Number: [id number] Patient Account Number: [id number] Date of Birth/Sex: [birth year] (88 y.o. Female) Treating RN: Donnamarie Poag Primary Care Provider: Melba Coon Other Clinician: Referring Provider: Referral, Self Treating Provider/Extender: Tito Dine in Treatment: 0 Active Problems ICD-10 Encounter Code Description Active Date MDM Diagnosis L97.211 Non-pressure chronic ulcer of right calf limited to breakdown of skin 11/09/2021 No Yes I89.0 Lymphedema, not elsewhere classified 11/09/2021 No Yes G18.29 Chronic diastolic (congestive) heart failure 11/09/2021 No Yes E11.622 Type 2 diabetes mellitus with other skin ulcer 11/09/2021 No Yes Inactive Problems Resolved Problems Electronic Signature(s) Signed: 11/09/2021 3:34:05 PM By: Linton Ham MD Entered By: Linton Ham on 11/09/2021 10:54:00 [name] [name] ([id number]) -------------------------------------------------------------------------------- Progress Note Details Patient Name: [name] [name] Date of Service: 11/09/2021 10:00 AM Medical Record Number: [id number] Patient Account Number: [id number] Date of Birth/Sex: 1925/09/25 (88  y.o. Female) Treating RN: Donnamarie Poag Primary Care Provider: Melba Coon Other Clinician: Referring Provider: Referral, Self Treating Provider/Extender: Ricard Dillon Weeks in Treatment: 0 Subjective History of Present Illness (HPI) ADMISSION 11/09/2021; this is a 88 year old woman who was accompanied by her caregiver who is her granddaughter. She has had a wound on her medial right calf since the beginning of December. Not exactly clear how this started. This was recently seen by palliative care and I think referred here. She has [facility] home health they have been using Vaseline gauze and her own stockings. She has chronic lymphedema and has had this for a number of years she has thigh-high compression stockings. She also has an appointment with Dr. Delana Meyer on Monday at [facility] vein and vascular. She was recently hospitalized for acute on chronic diastolic heart failure and currently is getting IV Lasix as an outpatient 5 days a week. According to granddaughter her weight is come down nicely. Past medical history includes type 2 diabetes with peripheral neuropathy, hypertension, chronic diastolic congestive heart failure, stage IV chronic kidney disease ABI was very difficult to get in our clinic but we got a very low value of 0.48. The patient does not easily complain of claudication or pain in the leg Patient History Information obtained from Caregiver. Allergies lisinopril (Severity: Moderate), prednisone (Severity: Moderate), codeine (Severity: Severe, Reaction: swelling) Social History Never smoker, Marital Status - Widowed, Alcohol Use - Never, Drug Use - No History, Caffeine Use - Moderate. Medical History Eyes Patient has history of Cataracts - hx surgery Respiratory Patient has history of Sleep Apnea - non compliant CPAP Cardiovascular Patient has history of Congestive Heart Failure - extensive/BNP 6466, Coronary Artery Disease, Hypertension Endocrine Patient  has history of Type II Diabetes - dx noted Integumentary (Skin) Denies history of History of pressure wounds Musculoskeletal Patient has history of Osteoarthritis Denies history of Gout Neurologic Patient has history of Dementia, Neuropathy Denies history of Seizure Disorder Blood sugar is not tested. Review of Systems (ROS) Constitutional Symptoms (General Health) Denies complaints or symptoms of Fatigue, Fever, Chills, Marked Weight Change. Eyes Complains or has symptoms of Glasses / Contacts. Ear/Nose/Mouth/Throat Denies complaints or symptoms of Difficult clearing ears, Sinusitis. Hematologic/Lymphatic Denies complaints or symptoms of Bleeding / Clotting Disorders, Human Immunodeficiency Virus. Respiratory Complains or has symptoms of Shortness of Breath. Cardiovascular Complains or has symptoms of LE edema, follow with cardiology/Lasix infusions currently/monitor weights Gastrointestinal Denies complaints or symptoms of Frequent diarrhea, Nausea, Vomiting. Endocrine Denies complaints or symptoms of Thyroid disease. Genitourinary CKD stage 3 follows nephrology providers Immunological [name] [name], [name] [name] ([id number]) Denies complaints or symptoms of Hives, Itching. Integumentary (  Skin) Complains or has symptoms of Swelling - bil LE. Denies complaints or symptoms of Wounds, Bleeding or bruising tendency, Breakdown. Psychiatric Denies complaints or symptoms of Anxiety, Claustrophobia. Objective Constitutional Patient is hypertensive.. Pulse regular and within target range for patient.Marland Kitchen Respirations regular, non-labored and within target range.. Temperature is normal and within the target range for the patient.Marland Kitchen appears in no distress. Vitals Time Taken: 10:10 AM, Height: 60 in, Source: Stated, Weight: 173 lbs, Source: Stated, BMI: 33.8, Temperature: 97.8 F, Pulse: 63 bpm, Respiratory Rate: 16 breaths/min, Blood Pressure: 182/80 mmHg. General Notes: daily weight at home to  monitor; unable to stand on clinic scale Respiratory Above normal respiratory effort noted. Respiratory rate elveaated. Bilateral crackles and bronchial breath sounds. Cardiovascular There is no murmurs. Jugular venous pressure does not appear to be elevated no sacral edema. Pedal pulses on the right are very difficult to feel either at the dorsalis pedis or posterior tibial. Very significant nonpitting edema in the foot and ankle. Nonpitting edema up to the right calf and may extend into the thigh. General Notes: Wound exam; the patient has a very small area on the right medial lower calf actually looks quite good a lot of this is already epithelialized only a very small fragile area remains Integumentary (Hair, Skin) Wound #1 status is Open. Original cause of wound was Gradually Appeared. The date acquired was: 09/20/2021. The wound is located on the Right Lower Leg. The wound measures 1.1cm length x 0.8cm width x 0.1cm depth; 0.691cm^2 area and 0.069cm^3 volume. There is no tunneling or undermining noted. There is a medium amount of serosanguineous drainage noted. There is small (1-33%) pink granulation within the wound bed. There is a large (67-100%) amount of necrotic tissue within the wound bed including Eschar and Adherent Slough. 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 There was a Chemical/Enzymatic/Mechanical debridement performed by Ricard Dillon, MD. With the following instrument(s): saline gauze after achieving pain control using Lidocaine. Other agent used was Gauze and saline. A time out was conducted  at 10:45, prior to the start of the procedure. There was no bleeding. The procedure was tolerated well. Post Debridement Measurements: 1.1cm length x 0.8cm width x 0.1cm depth; 0.069cm^3 volume. Character of Wound/Ulcer Post Debridement is improved. Severity of Tissue Post Debridement is: Limited to breakdown of skin. Post procedure Diagnosis Wound #1: Same as Pre-Procedure [name] [name], [name] [name] ([id number]) Plan Follow-up Appointments: Return Appointment in 1 week. Nurse Visit as needed Home Health: [place] for wound care. May utilize formulary equivalent dressing for wound treatment orders unless otherwise specified. Home Health Nurse may visit PRN to address patient s wound care needs. Scheduled days for dressing changes to be completed; exception, patient has scheduled wound care visit that day. **Please direct any NON-WOUND related issues/requests for orders to patient's Primary Care Physician. **If current dressing causes regression in wound condition, may D/C ordered dressing product/s and apply Normal Saline Moist Dressing daily until next [place] or Other MD appointment. **Notify Wound Healing Center of regression in wound condition at [phone]. Bathing/ Shower/ Hygiene: May shower; gently cleanse wound with antibacterial soap, rinse and pat dry  prior to dressing wounds Anesthetic (Use 'Patient Medications' Section for Anesthetic Order Entry): Lidocaine applied to wound bed Edema Control - Lymphedema / Segmental Compressive Device / Other: Patient to wear own compression stockings. Remove compression stockings every night before going to bed and put on every morning when getting up. - bilateral legs DO YOUR BEST to sleep in the bed at night. DO NOT sleep in your recliner. Long hours of sitting in a recliner leads to swelling of the legs and/or potential wounds on your backside. Non-Wound Condition: Additional non-wound  orders/instructions: - Use good moisture lotion Cetaphil or Cerave to legs at night after washing Additional Orders / Instructions: Follow Nutritious Diet and Increase Protein Intake General Notes: Keep appt with [facility] 11/12/21 Continue to follow with cardiology and nephrology as scheduled with extensive CHF WOUND #1: - Lower Leg Wound Laterality: Right Cleanser: Soap and Water Every Other Day/15 Days Discharge Instructions: Gently cleanse wound with antibacterial soap, rinse and pat dry prior to dressing wounds Cleanser: Wound Cleanser Every Other Day/15 Days Discharge Instructions: Wash your hands with soap and water. Remove old dressing, discard into plastic bag and place into trash. Cleanse the wound with Wound Cleanser prior to applying a clean dressing using gauze sponges, not tissues or cotton balls. Do not scrub or use excessive force. Pat dry using gauze sponges, not tissue or cotton balls. Primary Dressing: Hydrofera Blue Ready Transfer Foam, 2.5x2.5 (in/in) Every Other Day/15 Days Discharge Instructions: Apply Hydrofera Blue Ready to wound bed as directed Secondary Dressing: Zetuvit Plus Silicone Border Dressing 4x4 (in/in) Every Other Day/15 Days 1. Fortunately the patient's wound actually looks quite good and almost looks like it is close to completely epithelialized 2 she has chronic lymphedema and has thigh-high compression stockings. 3. Elected to use [facility] which should be able to close this down as long as the edema does not get worse. I am not going to use compression but I have asked them to use the compression stockings at least until we see how this does next week and have reviewed reviewed by vein and vascular 4. Although our ABI in our clinic was only 0.48 her leg did not look threatened and her foot was warm. This may be falsely low because of the amount of swelling 5. Based on clinical exam this patient is still in congestive heart failure  she is receiving IV Lasix as an outpatient. Nevertheless her edema looks more like lymphedema in her lower legs but her lung auscultation is really quite abnormal Electronic Signature(s) Signed: 11/09/2021 3:34:05 PM By: Linton Ham MD Entered By: Linton Ham on 11/09/2021 11:02:37 [name] [name] ([id number]) -------------------------------------------------------------------------------- ROS/PFSH Details Patient Name: [name] [name] Date of Service: 11/09/2021 10:00 AM Medical Record Number: [id number] Patient Account Number: [id number] Date of Birth/Sex: [birth year] (88 y.o. Female) Treating RN: Donnamarie Poag Primary Care Provider: Melba Coon Other Clinician: Referring Provider: Referral, Self Treating Provider/Extender: Ricard Dillon Weeks in Treatment: 0 Information Obtained From Caregiver Constitutional Symptoms (General Health) Complaints and Symptoms: Negative for: Fatigue; Fever; Chills; Marked Weight Change Eyes Complaints and Symptoms: Positive for: Glasses / Contacts Medical History: Positive for: Cataracts - hx surgery Ear/Nose/Mouth/Throat Complaints and Symptoms: Negative for: Difficult clearing ears; Sinusitis Hematologic/Lymphatic Complaints and Symptoms: Negative for: Bleeding / Clotting Disorders; Human Immunodeficiency Virus Respiratory Complaints and Symptoms: Positive for: Shortness of Breath Medical History: Positive for: Sleep Apnea - non compliant CPAP Cardiovascular Complaints and Symptoms: Positive for: LE edema Review of System Notes: follow with cardiology/Lasix  infusions currently/monitor weights Medical History: Positive for: Congestive Heart Failure - extensive/BNP 6466; Coronary Artery Disease; Hypertension Gastrointestinal Complaints and Symptoms: Negative for: Frequent diarrhea; Nausea; Vomiting Endocrine Complaints and Symptoms: Negative for: Thyroid disease Medical History: Positive for: Type II Diabetes - dx  noted Time with diabetes: 2-3 years Blood sugar tested every day: No [name] [name], [name] [name] ([id number]) Immunological Complaints and Symptoms: Negative for: Hives; Itching Integumentary (Skin) Complaints and Symptoms: Positive for: Swelling - bil LE Negative for: Wounds; Bleeding or bruising tendency; Breakdown Medical History: Negative for: History of pressure wounds Psychiatric Complaints and Symptoms: Negative for: Anxiety; Claustrophobia Genitourinary Complaints and Symptoms: Review of System Notes: CKD stage 3 follows nephrology providers Musculoskeletal Medical History: Positive for: Osteoarthritis Negative for: Gout Neurologic Medical History: Positive for: Dementia; Neuropathy Negative for: Seizure Disorder Oncologic HBO Extended History Items Eyes: Cataracts Immunizations Pneumococcal Vaccine: Received Pneumococcal Vaccination: Yes Received Pneumococcal Vaccination On or After 60th Birthday: Yes Implantable Devices None Family and Social History Never smoker; Marital Status - Widowed; Alcohol Use: Never; Drug Use: No History; Caffeine Use: Moderate; Financial Concerns: No; Food, Clothing or Shelter Needs: No; Support System Lacking: No; Transportation Concerns: No Electronic Signature(s) Signed: 11/09/2021 11:21:01 AM By: Donnamarie Poag Signed: 11/09/2021 3:34:05 PM By: Linton Ham MD Entered By: Donnamarie Poag on 11/09/2021 10:22:30 [name] [name] ([id number]) -------------------------------------------------------------------------------- SuperBill Details Patient Name: [name] [name] Date of Service: 11/09/2021 Medical Record Number: [id number] Patient Account Number: [id number] Date of Birth/Sex: 28-Dec-1924 (88 y.o. Female) Treating RN: Donnamarie Poag Primary Care Provider: Melba Coon Other Clinician: Referring Provider: Referral, Self Treating Provider/Extender: Ricard Dillon Weeks in Treatment: 0 Diagnosis Coding ICD-10 Codes Code  Description A56.979 Non-pressure chronic ulcer of right calf limited to breakdown of skin I89.0 Lymphedema, not elsewhere classified Y80.16 Chronic diastolic (congestive) heart failure E11.622 Type 2 diabetes mellitus with other skin ulcer Facility Procedures CPT4 Code: [id number] Description: 99214 - WOUND CARE VISIT-LEV 4 EST PT Modifier: Quantity: 1 Physician Procedures CPT4 Code: 0786754 Description: WC PHYS LEVEL 3 o NEW PT Modifier: Quantity: 1 CPT4 Code: Description: ICD-10 Diagnosis Description L97.211 Non-pressure chronic ulcer of right calf limited to breakdown of sk I89.0 Lymphedema, not elsewhere classified G92.01 Chronic diastolic (congestive) heart failure E11.622 Type 2 diabetes mellitus with  other skin ulcer Modifier: in Quantity: Electronic Signature(s) Signed: 11/09/2021 3:34:05 PM By: Linton Ham MD Entered By: Linton Ham on 11/09/2021 11:03:05

## 2021-11-09 NOTE — Progress Notes (Signed)
LIBERTIE, HAUSLER ([id number]) Visit Report for 11/09/2021 Allergy List Details Patient Name: [name] [name], [name] [name] Date of Service: 11/09/2021 10:00 AM Medical Record Number: [id number] Patient Account Number: [id number] Date of Birth/Sex: [birth year] (88 y.o. Female) Treating RN: Donnamarie Poag Primary Care Tanish Prien: Melba Coon Other Clinician: Referring Brittie Whisnant: Referral, Self Treating Lismary Kiehn/Extender: Ricard Dillon Weeks in Treatment: 0 Allergies Active Allergies lisinopril Severity: Moderate prednisone Severity: Moderate codeine Reaction: swelling Severity: Severe Allergy Notes Electronic Signature(s) Signed: 11/09/2021 11:21:01 AM By: Donnamarie Poag Entered By: Donnamarie Poag on 11/09/2021 10:18:09 [name] [name] ([id number]) -------------------------------------------------------------------------------- Arrival Information Details Patient Name: [name] [name] Date of Service: 11/09/2021 10:00 AM Medical Record Number: [id number] Patient Account Number: [id number] Date of Birth/Sex: 1925-02-13 (88 y.o. Female) Treating RN: Donnamarie Poag Primary Care Banyan Goodchild: Melba Coon Other Clinician: Referring Maleek Craver: Referral, Self Treating Anahis Furgeson/Extender: Tito Dine in Treatment: 0 Visit Information Patient Arrived: Wheel Chair Arrival Time: 10:01 Accompanied By: grand daughter Transfer Assistance: EasyPivot Patient Lift Patient Identification Verified: Yes Secondary Verification Process Completed: Yes Patient Requires Transmission-Based No Precautions: Patient Has Alerts: Yes Patient Alerts: DIABETIC Electronic Signature(s) Signed: 11/09/2021 11:21:01 AM By: Donnamarie Poag Entered By: Donnamarie Poag on 11/09/2021 10:15:32 [name] [name] ([id number]) -------------------------------------------------------------------------------- Clinic Level of Care Assessment Details Patient Name: [name] [name] Date of Service: 11/09/2021 10:00 AM Medical Record Number:  [id number] Patient Account Number: [id number] Date of Birth/Sex: 09/21/1925 (88 y.o. Female) Treating RN: Donnamarie Poag Primary Care Bode Pieper: Melba Coon Other Clinician: Referring Allannah Kempen: Referral, Self Treating Tkeya Stencil/Extender: Tito Dine in Treatment: 0 Clinic Level of Care Assessment Items TOOL 2 Quantity Score []  - Use when only an EandM is performed on the INITIAL visit 0 ASSESSMENTS - Nursing Assessment / Reassessment X - General Physical Exam (combine w/ comprehensive assessment (listed just below) when performed on new 1 20 pt. evals) X- 1 25 Comprehensive Assessment (HX, ROS, Risk Assessments, Wounds Hx, etc.) ASSESSMENTS - Wound and Skin Assessment / Reassessment X - Simple Wound Assessment / Reassessment - one wound 1 5 []  - 0 Complex Wound Assessment / Reassessment - multiple wounds []  - 0 Dermatologic / Skin Assessment (not related to wound area) ASSESSMENTS - Ostomy and/or Continence Assessment and Care []  - Incontinence Assessment and Management 0 []  - 0 Ostomy Care Assessment and Management (repouching, etc.) PROCESS - Coordination of Care X - Simple Patient / Family Education for ongoing care 1 15 []  - 0 Complex (extensive) Patient / Family Education for ongoing care X- 1 10 Staff obtains Programmer, systems, Records, Test Results / Process Orders X- 1 10 Staff telephones HHA, Nursing Homes / Clarify orders / etc []  - 0 Routine Transfer to another Facility (non-emergent condition) []  - 0 Routine Hospital Admission (non-emergent condition) X- 1 15 New Admissions / Biomedical engineer / Ordering NPWT, Apligraf, etc. []  - 0 Emergency Hospital Admission (emergent condition) X- 1 10 Simple Discharge Coordination []  - 0 Complex (extensive) Discharge Coordination PROCESS - Special Needs []  - Pediatric / Minor Patient Management 0 []  - 0 Isolation Patient Management []  - 0 Hearing / Language / Visual special needs []  - 0 Assessment of  Community assistance (transportation, D/C planning, etc.) []  - 0 Additional assistance / Altered mentation []  - 0 Support Surface(s) Assessment (bed, cushion, seat, etc.) INTERVENTIONS - Wound Cleansing / Measurement X - Wound Imaging (photographs - any number of wounds) 1 5 []  - 0 Wound Tracing (instead of photographs) X- 1 5 Simple Wound Measurement - one wound []  - 0 Complex Wound Measurement - multiple wounds  ANDJELA, WICKES ([id number]) X- 1 5 Simple Wound Cleansing - one wound []  - 0 Complex Wound Cleansing - multiple wounds INTERVENTIONS - Wound Dressings X - Small Wound Dressing one or multiple wounds 1 10 []  - 0 Medium Wound Dressing one or multiple wounds []  - 0 Large Wound Dressing one or multiple wounds []  - 0 Application of Medications - injection INTERVENTIONS - Miscellaneous []  - External ear exam 0 []  - 0 Specimen Collection (cultures, biopsies, blood, body fluids, etc.) []  - 0 Specimen(s) / Culture(s) sent or taken to Lab for analysis []  - 0 Patient Transfer (multiple staff / Harrel Lemon Lift / Similar devices) []  - 0 Simple Staple / Suture removal (25 or less) []  - 0 Complex Staple / Suture removal (26 or more) []  - 0 Hypo / Hyperglycemic Management (close monitor of Blood Glucose) X- 1 15 Ankle / Brachial Index (ABI) - do not check if billed separately Has the patient been seen at the hospital within the last three years: Yes Total Score: 150 Level Of Care: New/Established - Level 4 Electronic Signature(s) Signed: 11/09/2021 11:21:01 AM By: Donnamarie Poag Entered By: Donnamarie Poag on 11/09/2021 10:53:01 [name] [name] ([id number]) -------------------------------------------------------------------------------- Encounter Discharge Information Details Patient Name: [name] [name] Date of Service: 11/09/2021 10:00 AM Medical Record Number: [id number] Patient Account Number: [id number] Date of Birth/Sex: 13-Mar-1925 (88 y.o. Female) Treating RN: Donnamarie Poag Primary Care Makalah Asberry: Melba Coon Other Clinician: Referring Caylan Chenard: Referral, Self Treating Lionell Matuszak/Extender: Tito Dine in Treatment: 0 Encounter Discharge Information Items Post Procedure Vitals Discharge Condition: Stable Temperature (F): 97.8 Ambulatory Status: Wheelchair Pulse (bpm): 63 Discharge Destination: Home Respiratory Rate (breaths/min): 16 Transportation: Other Blood Pressure (mmHg): 182/80 Accompanied By: grand daughter Schedule Follow-up Appointment: Yes Clinical Summary of Care: Electronic Signature(s) Signed: 11/09/2021 11:21:01 AM By: Donnamarie Poag Entered By: Donnamarie Poag on 11/09/2021 11:03:33 [name] [name] ([id number]) -------------------------------------------------------------------------------- Lower Extremity Assessment Details Patient Name: [name] [name] Date of Service: 11/09/2021 10:00 AM Medical Record Number: [id number] Patient Account Number: [id number] Date of Birth/Sex: Nov 27, 1924 (88 y.o. Female) Treating RN: Donnamarie Poag Primary Care Ndeye Tenorio: Melba Coon Other Clinician: Referring Adain Geurin: Referral, Self Treating Karla Vines/Extender: Ricard Dillon Weeks in Treatment: 0 Edema Assessment Assessed: [Left: Yes] [Right: Yes] Edema: [Left: Yes] [Right: Yes] Calf Left: Right: Point of Measurement: 33 cm From Medial Instep 45 cm Ankle Left: Right: Point of Measurement: 12 cm From Medial Instep 30 cm Knee To Floor Left: Right: From Medial Instep 36 cm Vascular Assessment Pulses: Dorsalis Pedis Palpable: [Right:No] Doppler Audible: [Right:Yes] Blood Pressure: Brachial: [Right:124] Ankle: [Right:Dorsalis Pedis: 60 0.48] Electronic Signature(s) Signed: 11/09/2021 11:21:01 AM By: Donnamarie Poag Entered By: Donnamarie Poag on 11/09/2021 10:37:34 Bialas, [name] ([id number]) -------------------------------------------------------------------------------- Multi Wound Chart Details Patient Name: [name] [name] Date of Service: 11/09/2021 10:00 AM Medical Record Number: [id number] Patient Account Number: [id number] Date of Birth/Sex: March 30, 1925 (88 y.o. Female) Treating RN: Donnamarie Poag Primary Care Mylin Hirano: Melba Coon Other Clinician: Referring Etheline Geppert: Referral, Self Treating Shakeera Rightmyer/Extender: Ricard Dillon Weeks in Treatment: 0 Vital Signs Height(in): 60 Pulse(bpm): 80 Weight(lbs): 173 Blood Pressure(mmHg): 182/80 Body Mass Index(BMI): 34 Temperature(F): 97.8 Respiratory Rate(breaths/min): 16 Photos: [N/A:N/A] Wound Location: Right Lower Leg N/A N/A Wounding Event: Gradually Appeared N/A N/A Primary Etiology: Venous Leg Ulcer N/A N/A Secondary Etiology: Lymphedema N/A N/A Comorbid History: Cataracts, Sleep Apnea, Congestive N/A N/A Heart Failure, Coronary Artery Disease, Hypertension, Type II Diabetes, Osteoarthritis, Dementia, Neuropathy Date Acquired: 09/20/2021 N/A N/A Weeks of Treatment: 0 N/A N/A Wound Status: Open N/A N/A Measurements L  x W x D (cm) 1.1x0.8x0.1 N/A N/A Area (cm) : 0.691 N/A N/A Volume (cm) : 0.069 N/A N/A Classification: Partial Thickness N/A N/A Exudate Amount: Medium N/A N/A Exudate Type: Serosanguineous N/A N/A Exudate Color: red, brown N/A N/A Granulation Amount: Small (1-33%) N/A N/A Granulation Quality: Pink N/A N/A Necrotic Amount: Large (67-100%) N/A N/A Necrotic Tissue: Eschar, Adherent Slough N/A N/A Exposed Structures: Fascia: No N/A N/A Fat Layer (Subcutaneous Tissue): No Tendon: No Muscle: No Joint: No Bone: No Debridement: Chemical/Enzymatic/Mechanical N/A N/A Pre-procedure Verification/Time 10:45 N/A N/A Out Taken: Pain Control: Lidocaine N/A N/A Instrument: Other(saline gauze) N/A N/A Bleeding: None N/A N/A Debridement Treatment Procedure was tolerated well N/A N/A Response: Post Debridement 1.1x0.8x0.1 N/A N/A Measurements L x W x D (cm) Post Debridement Volume: 0.069 N/A N/A (cm) Procedures  Performed: Debridement N/A N/A TAYLORANN, TKACH ([id number]) Treatment Notes Electronic Signature(s) Signed: 11/09/2021 3:34:05 PM By: Linton Ham MD Entered By: Linton Ham on 11/09/2021 10:54:40 [name] [name] ([id number]) -------------------------------------------------------------------------------- Multi-Disciplinary Care Plan Details Patient Name: [name] [name] Date of Service: 11/09/2021 10:00 AM Medical Record Number: [id number] Patient Account Number: [id number] Date of Birth/Sex: 1925/02/03 (88 y.o. Female) Treating RN: Donnamarie Poag Primary Care Homer Pfeifer: Melba Coon Other Clinician: Referring Rickell Wiehe: Referral, Self Treating Amandamarie Feggins/Extender: Tito Dine in Treatment: 0 Active Inactive Orientation to the Wound Care Program Nursing Diagnoses: Knowledge deficit related to the wound healing center program Goals: Patient/caregiver will verbalize understanding of the [facility] Date Initiated: 11/09/2021 Target Resolution Date: 11/23/2021 Goal Status: Active Interventions: Provide education on orientation to the wound center Notes: Wound/Skin Impairment Nursing Diagnoses: Impaired tissue integrity Knowledge deficit related to smoking impact on wound healing Knowledge deficit related to ulceration/compromised skin integrity Goals: Patient/caregiver will verbalize understanding of skin care regimen Date Initiated: 11/09/2021 Target Resolution Date: 11/23/2021 Goal Status: Active Ulcer/skin breakdown will have a volume reduction of 30% by week 4 Date Initiated: 11/09/2021 Target Resolution Date: 12/07/2021 Goal Status: Active Ulcer/skin breakdown will have a volume reduction of 50% by week 8 Date Initiated: 11/09/2021 Target Resolution Date: 01/04/2022 Goal Status: Active Ulcer/skin breakdown will have a volume reduction of 80% by week 12 Date Initiated: 11/09/2021 Target Resolution Date: 02/01/2022 Goal Status: Active Ulcer/skin  breakdown will heal within 14 weeks Date Initiated: 11/09/2021 Target Resolution Date: 02/15/2022 Goal Status: Active Interventions: Assess patient/caregiver ability to obtain necessary supplies Assess patient/caregiver ability to perform ulcer/skin care regimen upon admission and as needed Assess ulceration(s) every visit Notes: Electronic Signature(s) Signed: 11/09/2021 11:21:01 AM By: Donnamarie Poag Entered By: Donnamarie Poag on 11/09/2021 10:47:14 Vicknair, [name] ([id number]) -------------------------------------------------------------------------------- Non-Wound Condition Assessment Details Patient Name: [name] [name] Date of Service: 11/09/2021 10:00 AM Medical Record Number: [id number] Patient Account Number: [id number] Date of Birth/Sex: 03-26-1925 (88 y.o. Female) Treating RN: Donnamarie Poag Primary Care Jayshun Galentine: Melba Coon Other Clinician: Referring Sibley Rolison: Referral, Self Treating Kingjames Coury/Extender: Ricard Dillon Weeks in Treatment: 0 Non-Wound Condition: Condition: Lymphedema Location: Leg Side: Bilateral Photos Electronic Signature(s) Signed: 11/09/2021 11:21:01 AM By: Donnamarie Poag Entered By: Donnamarie Poag on 11/09/2021 10:31:33 [name] [name] ([id number]) -------------------------------------------------------------------------------- Pain Assessment Details Patient Name: [name] [name] Date of Service: 11/09/2021 10:00 AM Medical Record Number: [id number] Patient Account Number: [id number] Date of Birth/Sex: Jan 24, 1925 (88 y.o. Female) Treating RN: Donnamarie Poag Primary Care Keymon Mcelroy: Melba Coon Other Clinician: Referring Solita Macadam: Referral, Self Treating Joye Wesenberg/Extender: Ricard Dillon Weeks in Treatment: 0 Active Problems Location of Pain Severity and Description of Pain Patient Has Paino No Site Locations Rate the pain. Current Pain Level:  0 Pain Management and Medication Current Pain Management: Electronic Signature(s) Signed: 11/09/2021  11:21:01 AM By: Donnamarie Poag Entered By: Donnamarie Poag on 11/09/2021 10:15:46 [name] [name] ([id number]) -------------------------------------------------------------------------------- Patient/Caregiver Education Details Patient Name: [name] [name] Date of Service: 11/09/2021 10:00 AM Medical Record Number: [id number] Patient Account Number: [id number] Date of Birth/Gender: 11-Jun-1925 (88 y.o. Female) Treating RN: Donnamarie Poag Primary Care Physician: Melba Coon Other Clinician: Referring Physician: Referral, Self Treating Physician/Extender: Tito Dine in Treatment: 0 Education Assessment Education Provided To: Patient and Caregiver Education Topics Provided Basic Hygiene: Elevated Blood Sugar/ Impact on Healing: Venous: Welcome To [facility]: Wound/Skin Impairment: Electronic Signature(s) Signed: 11/09/2021 11:21:01 AM By: Donnamarie Poag Entered By: Donnamarie Poag on 11/09/2021 11:00:45 [name] [name] ([id number]) -------------------------------------------------------------------------------- Wound Assessment Details Patient Name: [name] [name] Date of Service: 11/09/2021 10:00 AM Medical Record Number: [id number] Patient Account Number: [id number] Date of Birth/Sex: 09-16-1925 (88 y.o. Female) Treating RN: Donnamarie Poag Primary Care Tri Chittick: Melba Coon Other Clinician: Referring Keiosha Cancro: Referral, Self Treating Nadezhda Pollitt/Extender: Ricard Dillon Weeks in Treatment: 0 Wound Status Wound Number: 1 Primary Venous Leg Ulcer Etiology: Wound Location: Right Lower Leg Secondary Lymphedema Wounding Event: Gradually Appeared Etiology: Date Acquired: 09/20/2021 Wound Open Weeks Of Treatment: 0 Status: Clustered Wound: No Comorbid Cataracts, Sleep Apnea, Congestive Heart Failure, History: Coronary Artery Disease, Hypertension, Type II Diabetes, Osteoarthritis, Dementia, Neuropathy Photos Wound Measurements Length: (cm) 1.1 Width: (cm)  0.8 Depth: (cm) 0.1 Area: (cm) 0.691 Volume: (cm) 0.069 % Reduction in Area: % Reduction in Volume: Tunneling: No Undermining: No Wound Description Classification: Partial Thickness Exudate Amount: Medium Exudate Type: Serosanguineous Exudate Color: red, brown Foul Odor After Cleansing: No Slough/Fibrino Yes Wound Bed Granulation Amount: Small (1-33%) Exposed Structure Granulation Quality: Pink Fascia Exposed: No Necrotic Amount: Large (67-100%) Fat Layer (Subcutaneous Tissue) Exposed: No Necrotic Quality: Eschar, Adherent Slough Tendon Exposed: No Muscle Exposed: No Joint Exposed: No Bone Exposed: No Treatment Notes Wound #1 (Lower Leg) Wound Laterality: Right Cleanser Soap and Water Discharge Instruction: Gently cleanse wound with antibacterial soap, rinse and pat dry prior to dressing wounds Legendre, Nusayba ([id number]) Wound Cleanser Discharge Instruction: Wash your hands with soap and water. Remove old dressing, discard into plastic bag and place into trash. Cleanse the wound with Wound Cleanser prior to applying a clean dressing using gauze sponges, not tissues or cotton balls. Do not scrub or use excessive force. Pat dry using gauze sponges, not tissue or cotton balls. Peri-Wound Care Topical Primary Dressing Hydrofera Blue Ready Transfer Foam, 2.5x2.5 (in/in) Discharge Instruction: Apply Hydrofera Blue Ready to wound bed as directed Secondary Dressing Zetuvit Plus Silicone Border Dressing 4x4 (in/in) Secured With Compression Wrap Compression Stockings Add-Ons Electronic Signature(s) Signed: 11/09/2021 11:21:01 AM By: Donnamarie Poag Entered By: Donnamarie Poag on 11/09/2021 10:31:03 [name] [name] ([id number]) -------------------------------------------------------------------------------- Vitals Details Patient Name: [name] [name] Date of Service: 11/09/2021 10:00 AM Medical Record Number: [id number] Patient Account Number: [id number] Date of Birth/Sex: 03/06/1925  (88 y.o. Female) Treating RN: Donnamarie Poag Primary Care Caris Cerveny: Melba Coon Other Clinician: Referring Baily Hovanec: Referral, Self Treating Makaylynn Bonillas/Extender: Ricard Dillon Weeks in Treatment: 0 Vital Signs Time Taken: 10:10 Temperature (F): 97.8 Height (in): 60 Pulse (bpm): 63 Source: Stated Respiratory Rate (breaths/min): 16 Weight (lbs): 173 Blood Pressure (mmHg): 182/80 Source: Stated Reference Range: 80 - 120 mg / dl Body Mass Index (BMI): 33.8 Notes daily weight at home to monitor; unable to stand on clinic scale Electronic Signature(s) Signed: 11/09/2021 11:21:01 AM By: Donnamarie Poag Entered ByDonnamarie Poag on 11/09/2021  10:16:52 °

## 2021-11-09 NOTE — Progress Notes (Signed)
[name] [name], [name] [name] ([id number]) Visit Report for 11/09/2021 Abuse/Suicide Risk Screen Details Patient Name: [name] [name], [name] [name] Date of Service: 11/09/2021 10:00 AM Medical Record Number: [id number] Patient Account Number: [id number] Date of Birth/Sex: 1925/05/20 (88 y.o. Female) Treating RN: Donnamarie Poag Primary Care Amyia Lodwick: Melba Coon Other Clinician: Referring Humaira Sculley: Referral, Self Treating Freedom Lopezperez/Extender: Ricard Dillon Weeks in Treatment: 0 Abuse/Suicide Risk Screen Items Answer ABUSE RISK SCREEN: Has anyone close to you tried to hurt or harm you recentlyo No Do you feel uncomfortable with anyone in your familyo No Has anyone forced you do things that you didnot want to doo No Electronic Signature(s) Signed: 11/09/2021 11:21:01 AM By: Donnamarie Poag Entered By: Donnamarie Poag on 11/09/2021 10:22:42 [name] [name] ([id number]) -------------------------------------------------------------------------------- Activities of Daily Living Details Patient Name: [name] [name] Date of Service: 11/09/2021 10:00 AM Medical Record Number: [id number] Patient Account Number: [id number] Date of Birth/Sex: 1925/08/14 (88 y.o. Female) Treating RN: Donnamarie Poag Primary Care Chastin Riesgo: Melba Coon Other Clinician: Referring Huxley Vanwagoner: Referral, Self Treating Tamitha Norell/Extender: Ricard Dillon Weeks in Treatment: 0 Activities of Daily Living Items Answer Activities of Daily Living (Please select one for each item) Drive Automobile Not Able Take Medications Need Assistance Use Telephone Need Assistance Care for Appearance Need Assistance Use Toilet Need Assistance Bath / Shower Need Assistance Dress Self Need Assistance Feed Self Need Assistance Walk Need Assistance Get In / Out Bed Need Assistance Housework Not Able Prepare Meals Not [place] Not Able Shop for Self Not Able Electronic Signature(s) Signed: 11/09/2021 11:21:01 AM By: Donnamarie Poag Entered By: Donnamarie Poag on  11/09/2021 10:23:10 [name] [name] ([id number]) -------------------------------------------------------------------------------- Education Screening Details Patient Name: [name] [name] Date of Service: 11/09/2021 10:00 AM Medical Record Number: [id number] Patient Account Number: [id number] Date of Birth/Sex: Jan 24, 1925 (88 y.o. Female) Treating RN: Donnamarie Poag Primary Care Starling Christofferson: Melba Coon Other Clinician: Referring Nahima Ales: Referral, Self Treating Akaila Rambo/Extender: Tito Dine in Treatment: 0 Primary Learner Assessed: Patient Learning Preferences/Education Level/Primary Language Learning Preference: Explanation Highest Education Level: High School Preferred Language: English Cognitive Barrier Language Barrier: No Translator Needed: No Memory Deficit: Yes Emotional Barrier: No Cultural/Religious Beliefs Affecting Medical Care: No Physical Barrier Impaired Vision: No Impaired Hearing: Yes Decreased Hand dexterity: No Knowledge/Comprehension Knowledge Level: Medium Comprehension Level: Medium Ability to understand written instructions: Medium Ability to understand verbal instructions: Medium Motivation Anxiety Level: Calm Cooperation: Cooperative Education Importance: Acknowledges Need Interest in Health Problems: Uninterested Perception: Coherent Willingness to Engage in Self-Management Medium Activities: Readiness to Engage in Self-Management Medium Activities: Electronic Signature(s) Signed: 11/09/2021 11:21:01 AM By: Donnamarie Poag Entered By: Donnamarie Poag on 11/09/2021 10:23:44 [name] [name] ([id number]) -------------------------------------------------------------------------------- Fall Risk Assessment Details Patient Name: [name] [name] Date of Service: 11/09/2021 10:00 AM Medical Record Number: [id number] Patient Account Number: [id number] Date of Birth/Sex: [birth year] (88 y.o. Female) Treating RN: Donnamarie Poag Primary Care Rosita Guzzetta:  Melba Coon Other Clinician: Referring Kuron Docken: Referral, Self Treating Vickey Ewbank/Extender: Tito Dine in Treatment: 0 Fall Risk Assessment Items Have you had 2 or more falls in the last 12 monthso 0 No Have you had any fall that resulted in injury in the last 12 monthso 0 No FALLS RISK SCREEN History of falling - immediate or within 3 months 0 No Secondary diagnosis (Do you have 2 or more medical diagnoseso) 15 Yes Ambulatory aid None/bed rest/wheelchair/nurse 0 No Crutches/cane/walker 15 Yes Furniture 0 No Intravenous therapy Access/Saline/Heparin Lock 0 No Gait/Transferring Normal/ bed rest/ wheelchair 0 No Weak (short steps with or without shuffle, stooped but able to lift head  while walking, may 10 Yes seek support from furniture) Impaired (short steps with shuffle, may have difficulty arising from chair, head down, impaired 0 No balance) Mental Status Oriented to own ability 0 No Electronic Signature(s) Signed: 11/09/2021 11:21:01 AM By: Donnamarie Poag Entered By: Donnamarie Poag on 11/09/2021 10:23:56 [name] [name], [name] [name] ([id number]) -------------------------------------------------------------------------------- Foot Assessment Details Patient Name: [name] [name] Date of Service: 11/09/2021 10:00 AM Medical Record Number: [id number] Patient Account Number: [id number] Date of Birth/Sex: 02/07/1925 (88 y.o. Female) Treating RN: Donnamarie Poag Primary Care Adeline Petitfrere: Melba Coon Other Clinician: Referring Shermaine Rivet: Referral, Self Treating Carolyn Sylvia/Extender: Ricard Dillon Weeks in Treatment: 0 Foot Assessment Items Site Locations + = Sensation present, - = Sensation absent, C = Callus, U = Ulcer R = Redness, W = Warmth, M = Maceration, PU = Pre-ulcerative lesion F = Fissure, S = Swelling, D = Dryness Assessment Right: Left: Other Deformity: No No Prior Foot Ulcer: No No Prior Amputation: No No Charcot Joint: No No Ambulatory Status: Ambulatory With  Help Assistance Device: Walker Gait: Administrator, arts) Signed: 11/09/2021 11:21:01 AM By: Donnamarie Poag Entered By: Donnamarie Poag on 11/09/2021 10:26:18 [name] [name] ([id number]) -------------------------------------------------------------------------------- Nutrition Risk Screening Details Patient Name: [name] [name] Date of Service: 11/09/2021 10:00 AM Medical Record Number: [id number] Patient Account Number: [id number] Date of Birth/Sex: December 17, 1924 (88 y.o. Female) Treating RN: Donnamarie Poag Primary Care Uniqua Kihn: Melba Coon Other Clinician: Referring Hiba Garry: Referral, Self Treating Jolean Madariaga/Extender: Ricard Dillon Weeks in Treatment: 0 Height (in): 60 Weight (lbs): 173 Body Mass Index (BMI): 33.8 Nutrition Risk Screening Items Score Screening NUTRITION RISK SCREEN: I have an illness or condition that made me change the kind and/or amount of food I eat 0 No I eat fewer than two meals per day 0 No I eat few fruits and vegetables, or milk products 0 No I have three or more drinks of beer, liquor or wine almost every day 0 No I have tooth or mouth problems that make it hard for me to eat 0 No I don't always have enough money to buy the food I need 0 No I eat alone most of the time 0 No I take three or more different prescribed or over-the-counter drugs a day 0 No Without wanting to, I have lost or gained 10 pounds in the last six months 0 No I am not always physically able to shop, cook and/or feed myself 2 Yes Nutrition Protocols Good Risk Protocol 0 No interventions needed Moderate Risk Protocol High Risk Proctocol Risk Level: Good Risk Score: 2 Electronic Signature(s) Signed: 11/09/2021 11:21:01 AM By: Donnamarie Poag Entered ByDonnamarie Poag on 11/09/2021 10:24:22

## 2021-11-11 NOTE — Progress Notes (Deleted)
MRN : [id number]  [name] [name] is a 88 y.o. (Mar 19, 1925) female who presents with chief complaint of check swelling.  History of Present Illness:   Patient is seen for evaluation of leg pain and leg swelling. The patient first noticed the swelling remotely. The swelling is associated with pain and discoloration. The pain and swelling worsens with prolonged dependency and improves with elevation. The pain is unrelated to activity.  The patient notes that in the morning the legs are significantly improved but they steadily worsened throughout the course of the day. The patient also notes a steady worsening of the discoloration in the ankle and shin area.   The patient denies claudication symptoms.  The patient denies symptoms consistent with rest pain.  The patient denies and extensive history of DJD and LS spine disease.  The patient has no had any past angiography, interventions or vascular surgery.  Elevation makes the leg symptoms better, dependency makes them much worse. There is no history of ulcerations. The patient denies any recent changes in medications.  The patient has not been wearing graduated compression.  The patient denies a history of DVT or PE. There is no prior history of phlebitis. There is no history of primary lymphedema.  No history of malignancies. No history of trauma or groin or pelvic surgery. There is no history of radiation treatment to the groin or pelvis  The patient denies amaurosis fugax or recent TIA symptoms. There are no recent neurological changes noted. The patient denies recent episodes of angina or shortness of breath   No outpatient medications have been marked as taking for the 11/12/21 encounter (Appointment) with Delana Meyer, Dolores Lory, MD.    Past Medical History:  Diagnosis Date   Anemia    CHF (congestive heart failure) ([place])    Chronic kidney disease    Dementia ([place])    Diabetes mellitus without complication ([place])    GIB  (gastrointestinal bleeding)    Hyperlipidemia    Hypertension    Lymphedema    OSA on CPAP    Osteoarthritis    Vitamin D deficiency     Past Surgical History:  Procedure Laterality Date   ABDOMINAL HYSTERECTOMY      Social History Social History   Tobacco Use   Smoking status: Never   Smokeless tobacco: Never  Vaping Use   Vaping Use: Never used  Substance Use Topics   Alcohol use: Never   Drug use: Never    Family History Family History  Problem Relation Age of Onset   Kidney disease Mother    Congestive Heart Failure Mother    Lung cancer Daughter     Allergies  Allergen Reactions   Codeine Swelling   Lisinopril Swelling    Other reaction(s): Other angioedema    Prednisolone     Other reaction(s): Other Causes increased blood pressure     REVIEW OF SYSTEMS (Negative unless checked)  Constitutional: [] Weight loss  [] Fever  [] Chills Cardiac: [] Chest pain   [] Chest pressure   [] Palpitations   [] Shortness of breath when laying flat   [] Shortness of breath with exertion. Vascular:  [] Pain in legs with walking   [] Pain in legs at rest  [] History of DVT   [] Phlebitis   [x] Swelling in legs   [] Varicose veins   [] Non-healing ulcers Pulmonary:   [] Uses home oxygen   [] Productive cough   [] Hemoptysis   [] Wheeze  [] COPD   [] Asthma Neurologic:  [] Dizziness   [] Seizures   [] History of stroke   []   History of TIA  [] Aphasia   [] Vissual changes   [] Weakness or numbness in arm   [] Weakness or numbness in leg Musculoskeletal:   [] Joint swelling   [] Joint pain   [] Low back pain Hematologic:  [] Easy bruising  [] Easy bleeding   [] Hypercoagulable state   [] Anemic Gastrointestinal:  [] Diarrhea   [] Vomiting  [x] Gastroesophageal reflux/heartburn   [] Difficulty swallowing. Genitourinary:  [] Chronic kidney disease   [] Difficult urination  [] Frequent urination   [] Blood in urine Skin:  [] Rashes   [] Ulcers  Psychological:  [] History of anxiety   []  History of major  depression.  Physical Examination  There were no vitals filed for this visit. There is no height or weight on file to calculate BMI. Gen: WD/WN, NAD Head: [place]/AT, No temporalis wasting.  Ear/Nose/Throat: Hearing grossly intact, nares w/o erythema or drainage, pinna without lesions Eyes: PER, EOMI, sclera nonicteric.  Neck: Supple, no gross masses.  No JVD.  Pulmonary:  Good air movement, no audible wheezing, no use of accessory muscles.  Cardiac: RRR, precordium not hyperdynamic. Vascular:  scattered varicosities present bilaterally.  Mild venous stasis changes to the legs bilaterally.  2+ soft pitting edema  Vessel Right Left  Radial Palpable Palpable  Gastrointestinal: soft, non-distended. No guarding/no peritoneal signs.  Musculoskeletal: M/S 5/5 throughout.  No deformity.  Neurologic: CN 2-12 intact. Pain and light touch intact in extremities.  Symmetrical.  Speech is fluent. Motor exam as listed above. Psychiatric: Judgment intact, Mood & affect appropriate for pt's clinical situation. Dermatologic: Venous rashes no ulcers noted.  No changes consistent with cellulitis. Lymph : No lichenification or skin changes of chronic lymphedema.  CBC Lab Results  Component Value Date   WBC 5.1 10/25/2021   HGB 11.1 (L) 10/25/2021   HCT 35.3 (L) 10/25/2021   MCV 84.2 10/25/2021   PLT 167 10/25/2021    BMET    Component Value Date/Time   NA 141 10/25/2021 1005   K 3.4 (L) 10/25/2021 1005   CL 103 10/25/2021 1005   CO2 29 10/25/2021 1005   GLUCOSE 261 (H) 10/25/2021 1005   BUN 53 (H) 10/25/2021 1005   CREATININE 1.97 (H) 10/25/2021 1005   CALCIUM 8.2 (L) 10/25/2021 1005   GFRNONAA 23 (L) 10/25/2021 1005   CrCl cannot be calculated (Unknown ideal weight.).  COAG No results found for: INR, PROTIME  Radiology DG Chest 2 View  Result Date: 10/23/2021 CLINICAL DATA:  CHF EXAM: CHEST - 2 VIEW COMPARISON:  09/18/2021 FINDINGS: Cardiac enlargement without heart failure.  Atherosclerotic calcification aortic arch Moderate elevation right hemidiaphragm unchanged. Right lower lobe airspace density unchanged. This is likely atelectasis given the elevated hemidiaphragm. Mild left lower lobe atelectasis unchanged. No significant pleural effusion. IMPRESSION: Cardiac enlargement without heart failure. Bibasilar airspace disease unchanged most likely atelectasis. Moderate elevation right hemidiaphragm unchanged. Electronically Signed   By: Franchot Gallo M.D.   On: 10/23/2021 17:41     Assessment/Plan There are no diagnoses linked to this encounter.   Hortencia Pilar, MD  11/11/2021 2:56 PM

## 2021-11-12 ENCOUNTER — Telehealth: Payer: Self-pay

## 2021-11-12 ENCOUNTER — Ambulatory Visit
Admission: RE | Admit: 2021-11-12 | Discharge: 2021-11-12 | Disposition: A | Discharge status: Home / Self Care | Payer: Medicare PPO | Source: Ambulatory Visit | Attending: Nephrology | Admitting: Nephrology

## 2021-11-12 ENCOUNTER — Encounter (INDEPENDENT_AMBULATORY_CARE_PROVIDER_SITE_OTHER): Payer: Medicare PPO | Admitting: Vascular Surgery

## 2021-11-12 DIAGNOSIS — N184 Chronic kidney disease, stage 4 (severe): Secondary | ICD-10-CM | POA: Insufficient documentation

## 2021-11-12 DIAGNOSIS — R609 Edema, unspecified: Secondary | ICD-10-CM | POA: Insufficient documentation

## 2021-11-12 DIAGNOSIS — K219 Gastro-esophageal reflux disease without esophagitis: Secondary | ICD-10-CM

## 2021-11-12 DIAGNOSIS — I10 Essential (primary) hypertension: Secondary | ICD-10-CM

## 2021-11-12 DIAGNOSIS — E1122 Type 2 diabetes mellitus with diabetic chronic kidney disease: Secondary | ICD-10-CM

## 2021-11-12 DIAGNOSIS — I890 Lymphedema, not elsewhere classified: Secondary | ICD-10-CM

## 2021-11-12 MED ORDER — FUROSEMIDE 10 MG/ML IJ SOLN
INTRAMUSCULAR | Status: AC
  Administered 2021-11-12: 60 mg via INTRAVENOUS
  Filled 2021-11-12: qty 8

## 2021-11-12 MED ORDER — FUROSEMIDE 10 MG/ML IJ SOLN: 60.0000 mg | Freq: Once | INTRAMUSCULAR | Status: AC

## 2021-11-12 NOTE — Telephone Encounter (Signed)
102 pm.  Return call made to patient at the request of Ralene Bathe, NP.  [name] [name] stated she has called the PCP office and is waiting for the nurse to provide direction on what she needs to do.  [name] [name] stated she maybe taking patient back to the hospital.  [name] [name] stated no assistance was needed from Palliative Care at this time as she is waiting for PCP response.  Ralene Bathe, NP updated.

## 2021-11-14 ENCOUNTER — Ambulatory Visit
Admission: RE | Admit: 2021-11-14 | Discharge: 2021-11-14 | Disposition: A | Discharge status: Home / Self Care | Payer: Medicare PPO | Source: Ambulatory Visit | Attending: Nephrology | Admitting: Nephrology

## 2021-11-14 ENCOUNTER — Other Ambulatory Visit: Payer: Self-pay

## 2021-11-14 DIAGNOSIS — N184 Chronic kidney disease, stage 4 (severe): Secondary | ICD-10-CM | POA: Diagnosis not present

## 2021-11-14 DIAGNOSIS — R609 Edema, unspecified: Secondary | ICD-10-CM | POA: Insufficient documentation

## 2021-11-14 MED ORDER — FUROSEMIDE 10 MG/ML IJ SOLN
INTRAMUSCULAR | Status: AC
  Filled 2021-11-14: qty 6

## 2021-11-14 MED ORDER — FUROSEMIDE 10 MG/ML IJ SOLN: 60.0000 mg | Freq: Once | INTRAMUSCULAR | Status: DC

## 2021-11-14 NOTE — Progress Notes (Signed)
Attempts x2 to start PIV with #24 in right hand and right thumb.  Patient is very anxious with IV starts, begins crying before starting to attempt stick. Patient and family member state that they would rather not try another IV start and will come on Friday for her last injection.  Headed to a cardiology appt. Now and will call to cancel Friday appointment if cardiologist feels it is not necessary.

## 2021-11-16 ENCOUNTER — Encounter: Payer: Medicare PPO | Admitting: Physician Assistant

## 2021-11-16 ENCOUNTER — Ambulatory Visit
Admission: RE | Admit: 2021-11-16 | Discharge: 2021-11-16 | Disposition: A | Discharge status: Home / Self Care | Payer: Medicare PPO | Source: Ambulatory Visit | Attending: Nephrology | Admitting: Nephrology

## 2021-11-16 ENCOUNTER — Other Ambulatory Visit: Payer: Self-pay

## 2021-11-16 DIAGNOSIS — N184 Chronic kidney disease, stage 4 (severe): Secondary | ICD-10-CM | POA: Insufficient documentation

## 2021-11-16 DIAGNOSIS — R609 Edema, unspecified: Secondary | ICD-10-CM | POA: Insufficient documentation

## 2021-11-16 DIAGNOSIS — I130 Hypertensive heart and chronic kidney disease with heart failure and stage 1 through stage 4 chronic kidney disease, or unspecified chronic kidney disease: Secondary | ICD-10-CM | POA: Diagnosis not present

## 2021-11-16 MED ORDER — SODIUM CHLORIDE FLUSH 0.9 % IV SOLN
INTRAVENOUS | Status: AC
  Filled 2021-11-16: qty 10

## 2021-11-16 MED ORDER — FUROSEMIDE 10 MG/ML IJ SOLN
INTRAMUSCULAR | Status: AC
  Filled 2021-11-16: qty 4

## 2021-11-16 MED ORDER — FUROSEMIDE 10 MG/ML IJ SOLN: 60.0000 mg | Freq: Once | INTRAMUSCULAR | Status: AC

## 2021-11-16 MED ORDER — FUROSEMIDE 10 MG/ML IJ SOLN
INTRAMUSCULAR | Status: AC
  Administered 2021-11-16: 60 mg via INTRAVENOUS
  Filled 2021-11-16: qty 2

## 2021-11-16 NOTE — Progress Notes (Signed)
BASIL, BLAKESLEY ([id number]) Visit Report for 11/16/2021 Arrival Information Details Patient Name: [name] [name], [name] [name] Date of Service: 11/16/2021 9:45 AM Medical Record Number: [id number] Patient Account Number: [id number] Date of Birth/Sex: 1924/11/15 (88 y.o. F) Treating RN: Levora Dredge Primary Care Kearia Yin: Melba Coon Other Clinician: Referring Keydi Giel: Melba Coon Treating Arjun Hard/Extender: Skipper Cliche in Treatment: 1 Visit Information History Since Last Visit Added or deleted any medications: No Patient Arrived: Wheel Chair Any new allergies or adverse reactions: No Arrival Time: 10:18 Had a fall or experienced change in No Accompanied By: grand daughter activities of daily living that may affect Transfer Assistance: EasyPivot Patient risk of falls: Lift Hospitalized since last visit: No Patient Identification Verified: Yes Has Dressing in Place as Prescribed: Yes Secondary Verification Process Completed: Yes Pain Present Now: No Patient Requires Transmission-Based No Precautions: Patient Has Alerts: Yes Patient Alerts: DIABETIC Electronic Signature(s) Signed: 11/16/2021 4:39:26 PM By: Levora Dredge Entered By: Levora Dredge on 11/16/2021 10:28:18 [name] [name] ([id number]) -------------------------------------------------------------------------------- Clinic Level of Care Assessment Details Patient Name: [name] [name] Date of Service: 11/16/2021 9:45 AM Medical Record Number: [id number] Patient Account Number: [id number] Date of Birth/Sex: March 29, 1925 (88 y.o. F) Treating RN: Levora Dredge Primary Care Romain Erion: Melba Coon Other Clinician: Referring Skyrah Krupp: Melba Coon Treating Raylin Diguglielmo/Extender: Skipper Cliche in Treatment: 1 Clinic Level of Care Assessment Items TOOL 4 Quantity Score []  - Use when only an EandM is performed on FOLLOW-UP visit 0 ASSESSMENTS - Nursing Assessment / Reassessment []  - Reassessment of  Co-morbidities (includes updates in patient status) 0 []  - 0 Reassessment of Adherence to Treatment Plan ASSESSMENTS - Wound and Skin Assessment / Reassessment X - Simple Wound Assessment / Reassessment - one wound 1 5 []  - 0 Complex Wound Assessment / Reassessment - multiple wounds []  - 0 Dermatologic / Skin Assessment (not related to wound area) ASSESSMENTS - Focused Assessment []  - Circumferential Edema Measurements - multi extremities 0 []  - 0 Nutritional Assessment / Counseling / Intervention []  - 0 Lower Extremity Assessment (monofilament, tuning fork, pulses) []  - 0 Peripheral Arterial Disease Assessment (using hand held doppler) ASSESSMENTS - Ostomy and/or Continence Assessment and Care []  - Incontinence Assessment and Management 0 []  - 0 Ostomy Care Assessment and Management (repouching, etc.) PROCESS - Coordination of Care X - Simple Patient / Family Education for ongoing care 1 15 []  - 0 Complex (extensive) Patient / Family Education for ongoing care []  - 0 Staff obtains Programmer, systems, Records, Test Results / Process Orders []  - 0 Staff telephones HHA, Nursing Homes / Clarify orders / etc []  - 0 Routine Transfer to another Facility (non-emergent condition) []  - 0 Routine Hospital Admission (non-emergent condition) []  - 0 New Admissions / Biomedical engineer / Ordering NPWT, Apligraf, etc. []  - 0 Emergency Hospital Admission (emergent condition) X- 1 10 Simple Discharge Coordination []  - 0 Complex (extensive) Discharge Coordination PROCESS - Special Needs []  - Pediatric / Minor Patient Management 0 []  - 0 Isolation Patient Management []  - 0 Hearing / Language / Visual special needs []  - 0 Assessment of Community assistance (transportation, D/C planning, etc.) []  - 0 Additional assistance / Altered mentation []  - 0 Support Surface(s) Assessment (bed, cushion, seat, etc.) INTERVENTIONS - Wound Cleansing / Measurement Forero, Erin ([id number]) X- 1  5 Simple Wound Cleansing - one wound []  - 0 Complex Wound Cleansing - multiple wounds X- 1 5 Wound Imaging (photographs - any number of wounds) []  - 0 Wound Tracing (instead of photographs) X- 1 5 Simple Wound Measurement -  one wound []  - 0 Complex Wound Measurement - multiple wounds INTERVENTIONS - Wound Dressings []  - Small Wound Dressing one or multiple wounds 0 []  - 0 Medium Wound Dressing one or multiple wounds []  - 0 Large Wound Dressing one or multiple wounds []  - 0 Application of Medications - topical []  - 0 Application of Medications - injection INTERVENTIONS - Miscellaneous []  - External ear exam 0 []  - 0 Specimen Collection (cultures, biopsies, blood, body fluids, etc.) []  - 0 Specimen(s) / Culture(s) sent or taken to Lab for analysis []  - 0 Patient Transfer (multiple staff / Civil Service fast streamer / Similar devices) []  - 0 Simple Staple / Suture removal (25 or less) []  - 0 Complex Staple / Suture removal (26 or more) []  - 0 Hypo / Hyperglycemic Management (close monitor of Blood Glucose) []  - 0 Ankle / Brachial Index (ABI) - do not check if billed separately X- 1 5 Vital Signs Has the patient been seen at the hospital within the last three years: Yes Total Score: 50 Level Of Care: New/Established - Level 2 Electronic Signature(s) Signed: 11/16/2021 4:39:26 PM By: Levora Dredge Entered By: Levora Dredge on 11/16/2021 10:47:12 [name] [name] ([id number]) -------------------------------------------------------------------------------- Lower Extremity Assessment Details Patient Name: [name] [name] Date of Service: 11/16/2021 9:45 AM Medical Record Number: [id number] Patient Account Number: [id number] Date of Birth/Sex: 1925-09-17 (88 y.o. F) Treating RN: Levora Dredge Primary Care Jetty Berland: Melba Coon Other Clinician: Referring Jayson Waterhouse: Melba Coon Treating Anant Agard/Extender: Skipper Cliche in Treatment: 1 Edema Assessment Assessed: [Left:  No] [Right: No] Edema: [Left: Ye] [Right: s] Calf Left: Right: Point of Measurement: 33 cm From Medial Instep 45.2 cm Ankle Left: Right: Point of Measurement: 12 cm From Medial Instep 31 cm Vascular Assessment Pulses: Dorsalis Pedis Doppler Audible: [Right:Yes] Posterior Tibial Doppler Audible: [Right:Yes] Electronic Signature(s) Signed: 11/16/2021 4:39:26 PM By: Levora Dredge Entered By: Levora Dredge on 11/16/2021 10:33:45 [name] [name], [name] [name] ([id number]) -------------------------------------------------------------------------------- Multi Wound Chart Details Patient Name: [name] [name] Date of Service: 11/16/2021 9:45 AM Medical Record Number: [id number] Patient Account Number: [id number] Date of Birth/Sex: [birth year] (88 y.o. F) Treating RN: Levora Dredge Primary Care Bernhardt Riemenschneider: Melba Coon Other Clinician: Referring Carlon Davidson: Melba Coon Treating Javayah Magaw/Extender: Skipper Cliche in Treatment: 1 Vital Signs Height(in): 60 Pulse(bpm): 83 Weight(lbs): 173 Blood Pressure(mmHg): 168/79 Body Mass Index(BMI): 33.8 Temperature(F): 97.4 Respiratory Rate(breaths/min): 18 Photos: [N/A:N/A] Wound Location: Right Lower Leg N/A N/A Wounding Event: Gradually Appeared N/A N/A Primary Etiology: Venous Leg Ulcer N/A N/A Secondary Etiology: Lymphedema N/A N/A Comorbid History: Cataracts, Sleep Apnea, Congestive N/A N/A Heart Failure, Coronary Artery Disease, Hypertension, Type II Diabetes, Osteoarthritis, Dementia, Neuropathy Date Acquired: 09/20/2021 N/A N/A Weeks of Treatment: 1 N/A N/A Wound Status: Healed - Epithelialized N/A N/A Wound Recurrence: No N/A N/A Measurements L x W x D (cm) 0x0x0 N/A N/A Area (cm) : 0 N/A N/A Volume (cm) : 0 N/A N/A % Reduction in Area: 100.00% N/A N/A % Reduction in Volume: 100.00% N/A N/A Classification: Partial Thickness N/A N/A Exudate Amount: None Present N/A N/A Granulation Amount: None Present (0%) N/A N/A Necrotic  Amount: None Present (0%) N/A N/A Exposed Structures: Fascia: No N/A N/A Fat Layer (Subcutaneous Tissue): No Tendon: No Muscle: No Joint: No Bone: No Epithelialization: Large (67-100%) N/A N/A Treatment Notes Electronic Signature(s) Signed: 11/16/2021 4:39:26 PM By: Levora Dredge Entered By: Levora Dredge on 11/16/2021 10:45:13 [name] [name] ([id number]) -------------------------------------------------------------------------------- Multi-Disciplinary Care Plan Details Patient Name: [name] [name] Date of Service: 11/16/2021 9:45 AM Medical Record Number: [id number] Patient Account Number:  [id number] Date of Birth/Sex: 1925-05-20 (88 y.o. F) Treating RN: Levora Dredge Primary Care Makih Stefanko: Melba Coon Other Clinician: Referring Tova Vater: Melba Coon Treating Skylen Danielsen/Extender: Skipper Cliche in Treatment: 1 Active Inactive Electronic Signature(s) Signed: 11/16/2021 4:39:26 PM By: Levora Dredge Entered By: Levora Dredge on 11/16/2021 10:44:57 [name] [name], [name] [name] ([id number]) -------------------------------------------------------------------------------- Pain Assessment Details Patient Name: [name] [name] Date of Service: 11/16/2021 9:45 AM Medical Record Number: [id number] Patient Account Number: [id number] Date of Birth/Sex: 08/19/1925 (88 y.o. F) Treating RN: Levora Dredge Primary Care Naya Ilagan: Melba Coon Other Clinician: Referring Denay Pleitez: Melba Coon Treating Clorene Nerio/Extender: Skipper Cliche in Treatment: 1 Active Problems Location of Pain Severity and Description of Pain Patient Has Paino No Site Locations Rate the pain. Current Pain Level: 0 Pain Management and Medication Current Pain Management: Electronic Signature(s) Signed: 11/16/2021 4:39:26 PM By: Levora Dredge Entered By: Levora Dredge on 11/16/2021 10:32:47 [name] [name]  ([id number]) -------------------------------------------------------------------------------- Patient/Caregiver Education Details Patient Name: [name] [name] Date of Service: 11/16/2021 9:45 AM Medical Record Number: [id number] Patient Account Number: [id number] Date of Birth/Gender: [birth year] (88 y.o. F) Treating RN: Levora Dredge Primary Care Physician: Melba Coon Other Clinician: Referring Physician: Melba Coon Treating Physician/Extender: Skipper Cliche in Treatment: 1 Education Assessment Education Provided To: Patient and Caregiver Education Topics Provided Wound/Skin Impairment: Handouts: Other: care of healed wound Methods: Explain/Verbal Responses: State content correctly Electronic Signature(s) Signed: 11/16/2021 4:39:26 PM By: Levora Dredge Entered By: Levora Dredge on 11/16/2021 10:47:38 [name] [name], [name] [name] ([id number]) -------------------------------------------------------------------------------- Wound Assessment Details Patient Name: [name] [name] Date of Service: 11/16/2021 9:45 AM Medical Record Number: [id number] Patient Account Number: [id number] Date of Birth/Sex: [birth year] (88 y.o. F) Treating RN: Levora Dredge Primary Care Prestyn Stanco: Melba Coon Other Clinician: Referring Elnita Surprenant: Melba Coon Treating Lucette Kratz/Extender: Skipper Cliche in Treatment: 1 Wound Status Wound Number: 1 Primary Venous Leg Ulcer Etiology: Wound Location: Right Lower Leg Secondary Lymphedema Wounding Event: Gradually Appeared Etiology: Date Acquired: 09/20/2021 Wound Healed - Epithelialized Weeks Of Treatment: 1 Status: Clustered Wound: No Comorbid Cataracts, Sleep Apnea, Congestive Heart Failure, History: Coronary Artery Disease, Hypertension, Type II Diabetes, Osteoarthritis, Dementia, Neuropathy Photos Wound Measurements Length: (cm) 0 % Red Width: (cm) 0 % Red Depth: (cm) 0 Epith Area: (cm) 0 Tunn Volume: (cm) 0 Unde uction  in Area: 100% uction in Volume: 100% elialization: Large (67-100%) eling: No rmining: No Wound Description Classification: Partial Thickness Foul Exudate Amount: None Present Slou Odor After Cleansing: No gh/Fibrino Yes Wound Bed Granulation Amount: None Present (0%) Exposed Structure Necrotic Amount: None Present (0%) Fascia Exposed: No Fat Layer (Subcutaneous Tissue) Exposed: No Tendon Exposed: No Muscle Exposed: No Joint Exposed: No Bone Exposed: No Treatment Notes Wound #1 (Lower Leg) Wound Laterality: Right Cleanser Peri-Wound Care Topical [name] [name], [name] [name] ([id number]) Primary Dressing Secondary Dressing Secured With Compression Wrap Compression Stockings Add-Ons Electronic Signature(s) Signed: 11/16/2021 4:39:26 PM By: Levora Dredge Entered By: Levora Dredge on 11/16/2021 10:44:27 [name] [name] ([id number]) -------------------------------------------------------------------------------- Vitals Details Patient Name: [name] [name] Date of Service: 11/16/2021 9:45 AM Medical Record Number: [id number] Patient Account Number: [id number] Date of Birth/Sex: [birth year] (88 y.o. F) Treating RN: Levora Dredge Primary Care Alleah Dearman: Melba Coon Other Clinician: Referring Kaesha Kirsch: Melba Coon Treating Lynard Postlewait/Extender: Skipper Cliche in Treatment: 1 Vital Signs Time Taken: 10:28 Temperature (F): 97.4 Height (in): 60 Pulse (bpm): 83 Weight (lbs): 173 Respiratory Rate (breaths/min): 18 Body Mass Index (BMI): 33.8 Blood Pressure (mmHg): 168/79 Reference Range: 80 - 120 mg / dl Electronic Signature(s) Signed: 11/16/2021 4:39:26 PM By: Levora Dredge Entered By: Levora Dredge on 11/16/2021  10:32:14 °

## 2021-11-16 NOTE — Progress Notes (Addendum)
SHAMIRA, TOUTANT ([id number]) Visit Report for 11/16/2021 Chief Complaint Document Details Patient Name: [name] [name], WEISMANN Date of Service: 11/16/2021 9:45 AM Medical Record Number: [id number] Patient Account Number: [id number] Date of Birth/Sex: [birth year] (88 y.o. F) Treating RN: Levora Dredge Primary Care Provider: Melba Coon Other Clinician: Referring Provider: Melba Coon Treating Provider/Extender: Skipper Cliche in Treatment: 1 Information Obtained from: Patient Chief Complaint Right LE Ulcer Electronic Signature(s) Signed: 11/16/2021 10:33:44 AM By: Worthy Keeler PA-C Entered By: Worthy Keeler on 11/16/2021 10:33:44 Vercher, [name] ([id number]) -------------------------------------------------------------------------------- HPI Details Patient Name: [name] [name] Date of Service: 11/16/2021 9:45 AM Medical Record Number: [id number] Patient Account Number: [id number] Date of Birth/Sex: August 04, 1925 (88 y.o. F) Treating RN: Levora Dredge Primary Care Provider: Melba Coon Other Clinician: Referring Provider: Melba Coon Treating Provider/Extender: Skipper Cliche in Treatment: 1 History of Present Illness HPI Description: ADMISSION 11/09/2021; this is a 88 year old woman who was accompanied by her caregiver who is her granddaughter. She has had a wound on her medial right calf since the beginning of December. Not exactly clear how this started. This was recently seen by palliative care and I think referred here. She has [facility] home health they have been using Vaseline gauze and her own stockings. She has chronic lymphedema and has had this for a number of years she has thigh-high compression stockings. She also has an appointment with Dr. Delana Meyer on Monday at [facility] vein and vascular. She was recently hospitalized for acute on chronic diastolic heart failure and currently is getting IV Lasix as an outpatient 5 days a week. According to  granddaughter her weight is come down nicely. Past medical history includes type 2 diabetes with peripheral neuropathy, hypertension, chronic diastolic congestive heart failure, stage IV chronic kidney disease ABI was very difficult to get in our clinic but we got a very low value of 0.48. The patient does not easily complain of claudication or pain in the leg 11/16/2021 upon evaluation today patient appears to be doing well currently in regard to her wounds. In fact the wound appears to be completely healed which is great news. She does want to see about a referral to the lymphedema clinic. Electronic Signature(s) Signed: 11/16/2021 10:55:35 AM By: Worthy Keeler PA-C Entered By: Worthy Keeler on 11/16/2021 10:55:34 [name] [name] ([id number]) -------------------------------------------------------------------------------- Physical Exam Details Patient Name: [name] [name] Date of Service: 11/16/2021 9:45 AM Medical Record Number: [id number] Patient Account Number: [id number] Date of Birth/Sex: December 01, 1924 (88 y.o. F) Treating RN: Levora Dredge Primary Care Provider: Melba Coon Other Clinician: Referring Provider: Melba Coon Treating Provider/Extender: Skipper Cliche in Treatment: 1 Constitutional Well-nourished and well-hydrated in no acute distress. Respiratory normal breathing without difficulty. Psychiatric this patient is able to make decisions and demonstrates good insight into disease process. Alert and Oriented x 3. pleasant and cooperative. Notes Upon inspection patient's wound bed actually showed signs of good granulation epithelization at this point in time. Fortunately I do not see any signs of active infection or open wounds which is great news. Electronic Signature(s) Signed: 11/16/2021 10:55:54 AM By: Worthy Keeler PA-C Entered By: Worthy Keeler on 11/16/2021 10:55:54 [name] [name]  ([id number]) -------------------------------------------------------------------------------- Physician Orders Details Patient Name: [name] [name] Date of Service: 11/16/2021 9:45 AM Medical Record Number: [id number] Patient Account Number: [id number] Date of Birth/Sex: [birth year] (88 y.o. F) Treating RN: Levora Dredge Primary Care Provider: Melba Coon Other Clinician: Referring Provider: Melba Coon Treating Provider/Extender: Skipper Cliche in Treatment: 1 Verbal / Phone Orders: No Diagnosis Coding ICD-10  Coding Code Description O16.073 Non-pressure chronic ulcer of right calf limited to breakdown of skin I89.0 Lymphedema, not elsewhere classified X10.62 Chronic diastolic (congestive) heart failure E11.622 Type 2 diabetes mellitus with other skin ulcer Discharge From [facility] Services o Wear compression garments daily. Put garments on first thing when you wake up and remove them before bed. - apply lotion to legs at night time. o Moisturize legs daily after removing compression garments. o Elevate, Exercise Daily and Avoid Standing for Long Periods of Time. o DO YOUR BEST to sleep in the bed at night. DO NOT sleep in your recliner. Long hours of sitting in a recliner leads to swelling of the legs and/or potential wounds on your backside. [place]: - [facility] for wound care. May utilize formulary equivalent dressing for wound treatment orders unless otherwise specified. Home Health Nurse may visit PRN to address patientos wound care needs. o **Please direct any NON-WOUND related issues/requests for orders to patient's Primary Care Physician. **If current dressing causes regression in wound condition, may D/C ordered dressing product/s and apply Normal Saline Moist Dressing daily until next [place] or Other MD appointment. **Notify Wound Healing Center of regression in wound condition at  [phone]. Edema Control - Lymphedema / Segmental Compressive Device / Other o Patient to wear own compression stockings. Remove compression stockings every night before going to bed and put on every morning when getting up. - bilateral legs o DO YOUR BEST to sleep in the bed at night. DO NOT sleep in your recliner. Long hours of sitting in a recliner leads to swelling of the legs and/or potential wounds on your backside. Non-Wound Condition o Additional non-wound orders/instructions: - Use good moisture lotion Cetaphil or Cerave to legs at night after washing Additional Orders / Instructions o Follow Nutritious Diet and Increase Protein Intake Consults o Occupational Therapy - lymphedema clinic referral for bilateral LE no open wounds, occupational therapy to evaluate and treat patients' bilateral lower extremity lymphoedema - (ICD10 I89.0 - Lymphedema, not elsewhere classified) Electronic Signature(s) Signed: 11/16/2021 10:58:09 AM By: Worthy Keeler PA-C Signed: 11/16/2021 4:39:26 PM By: Levora Dredge Entered By: Levora Dredge on 11/16/2021 10:50:56 [name] [name] ([id number]) -------------------------------------------------------------------------------- Prescription 11/16/2021 Patient Name: [name] [name] Provider: [name] PA-C Date of Birth: December 08, 1924 NPI#: [id number] Sex: F DEA#: [id number] Phone #: [phone] License #: Patient Address: [facility] [place], [place] [address] [address], [place], [place] [address] [phone] Allergies codeine; lisinopril; prednisone Provider's Orders o Occupational Therapy - ICD10: I89.0 - lymphedema clinic referral for bilateral LE no open wounds, occupational therapy to evaluate and treat patients' bilateral lower extremity lymphoedema Hand Signature: Date(s): Electronic Signature(s) Signed: 11/16/2021 10:58:09 AM By: Worthy Keeler PA-C Signed: 11/16/2021 4:39:26 PM By: Levora Dredge Entered By: Levora Dredge on 11/16/2021 10:50:56 [place], [name] ([id number]) --------------------------------------------------------------------------------  Problem List Details Patient Name: [name] [name] Date of Service: 11/16/2021 9:45 AM Medical Record Number: [id number] Patient Account Number: [id number] Date of Birth/Sex: 05-06-1925 (88 y.o. F) Treating RN: Levora Dredge Primary Care Provider: Melba Coon Other Clinician: Referring Provider: Melba Coon Treating Provider/Extender: Skipper Cliche in Treatment: 1 Active Problems ICD-10 Encounter Code Description Active Date MDM Diagnosis L97.211 Non-pressure chronic ulcer of right calf limited to breakdown of skin 11/09/2021 No Yes I89.0 Lymphedema, not elsewhere classified 11/09/2021 No Yes P38.25 Chronic diastolic (congestive) heart failure 11/09/2021 No Yes E11.622 Type 2  diabetes mellitus with other skin ulcer 11/09/2021 No Yes Inactive Problems Resolved Problems Electronic Signature(s) Signed: 11/16/2021 10:33:12 AM By: Worthy Keeler PA-C Entered By: Worthy Keeler on 11/16/2021 10:33:11 Lingle, [name] ([id number]) -------------------------------------------------------------------------------- Progress Note Details Patient Name: [name] [name] Date of Service: 11/16/2021 9:45 AM Medical Record Number: [id number] Patient Account Number: [id number] Date of Birth/Sex: [birth year] (88 y.o. F) Treating RN: Levora Dredge Primary Care Provider: Melba Coon Other Clinician: Referring Provider: Melba Coon Treating Provider/Extender: Skipper Cliche in Treatment: 1 Subjective Chief Complaint Information obtained from Patient Right LE Ulcer History of Present Illness (HPI) ADMISSION 11/09/2021; this is a 88 year old woman who was accompanied by her caregiver who is her granddaughter. She has had a wound on her medial right  calf since the beginning of December. Not exactly clear how this started. This was recently seen by palliative care and I think referred here. She has [facility] home health they have been using Vaseline gauze and her own stockings. She has chronic lymphedema and has had this for a number of years she has thigh-high compression stockings. She also has an appointment with Dr. Delana Meyer on Monday at [facility] vein and vascular. She was recently hospitalized for acute on chronic diastolic heart failure and currently is getting IV Lasix as an outpatient 5 days a week. According to granddaughter her weight is come down nicely. Past medical history includes type 2 diabetes with peripheral neuropathy, hypertension, chronic diastolic congestive heart failure, stage IV chronic kidney disease ABI was very difficult to get in our clinic but we got a very low value of 0.48. The patient does not easily complain of claudication or pain in the leg 11/16/2021 upon evaluation today patient appears to be doing well currently in regard to her wounds. In fact the wound appears to be completely healed which is great news. She does want to see about a referral to the lymphedema clinic. Objective Constitutional Well-nourished and well-hydrated in no acute distress. Vitals Time Taken: 10:28 AM, Height: 60 in, Weight: 173 lbs, BMI: 33.8, Temperature: 97.4 F, Pulse: 83 bpm, Respiratory Rate: 18 breaths/min, Blood Pressure: 168/79 mmHg. Respiratory normal breathing without difficulty. Psychiatric this patient is able to make decisions and demonstrates good insight into disease process. Alert and Oriented x 3. pleasant and cooperative. General Notes: Upon inspection patient's wound bed actually showed signs of good granulation epithelization at this point in time. Fortunately I do not see any signs of active infection or open wounds which is great news. Integumentary (Hair, Skin) Wound #1 status is Healed - Epithelialized.  Original cause of wound was Gradually Appeared. The date acquired was: 09/20/2021. The wound has been in treatment 1 weeks. The wound is located on the Right Lower Leg. The wound measures 0cm length x 0cm width x 0cm depth; 0cm^2 area and 0cm^3 volume. There is no tunneling or undermining noted. There is a none present amount of drainage noted. There is no granulation within the wound bed. There is no necrotic tissue within the wound bed. Assessment Active Problems LASHAWNTA, BURGERT ([id number]) ICD-10 Non-pressure chronic ulcer of right calf limited to breakdown of skin Lymphedema, not elsewhere classified Chronic diastolic (congestive) heart failure Type 2 diabetes mellitus with other skin ulcer Plan Discharge From [facility] Services: Wear compression garments daily. Put garments on first thing when you wake up and remove them before bed. - apply lotion to legs at night time. Moisturize legs daily after removing compression garments. Elevate, Exercise Daily and Avoid Standing for Long Periods of Time.  DO YOUR BEST to sleep in the bed at night. DO NOT sleep in your recliner. Long hours of sitting in a recliner leads to swelling of the legs and/or potential wounds on your backside. Home Health: [place] for wound care. May utilize formulary equivalent dressing for wound treatment orders unless otherwise specified. Home Health Nurse may visit PRN to address patient s wound care needs. **Please direct any NON-WOUND related issues/requests for orders to patient's Primary Care Physician. **If current dressing causes regression in wound condition, may D/C ordered dressing product/s and apply Normal Saline Moist Dressing daily until next [place] or Other MD appointment. **Notify Wound Healing Center of regression in wound condition at [phone]. Edema Control - Lymphedema / Segmental Compressive Device / Other: Patient to wear own compression  stockings. Remove compression stockings every night before going to bed and put on every morning when getting up. - bilateral legs DO YOUR BEST to sleep in the bed at night. DO NOT sleep in your recliner. Long hours of sitting in a recliner leads to swelling of the legs and/or potential wounds on your backside. Non-Wound Condition: Additional non-wound orders/instructions: - Use good moisture lotion Cetaphil or Cerave to legs at night after washing Additional Orders / Instructions: Follow Nutritious Diet and Increase Protein Intake Consults ordered were: Occupational Therapy - lymphedema clinic referral for bilateral LE no open wounds, occupational therapy to evaluate and treat patients' bilateral lower extremity lymphoedema 1. I do believe the patient is completely healed I see nothing open at this time. 2. I am also can recommend based on what I am seeing currently that we have the patient go ahead and go to the lymphedema clinic for further evaluation and treatment. I think they can definitely help with try to get things under better control for long-term she does have thigh-high compression for the time being however. Her daughter did want to see if we can make this referral. We will see the patient back for follow-up visit as needed she has no open wounds at this time. Electronic Signature(s) Signed: 11/16/2021 10:57:03 AM By: Worthy Keeler PA-C Entered By: Worthy Keeler on 11/16/2021 10:57:03 Loomis, [name] ([id number]) -------------------------------------------------------------------------------- SuperBill Details Patient Name: [name] [name] Date of Service: 11/16/2021 Medical Record Number: [id number] Patient Account Number: [id number] Date of Birth/Sex: 1925-03-02 (88 y.o. F) Treating RN: Levora Dredge Primary Care Provider: Melba Coon Other Clinician: Referring Provider: Melba Coon Treating Provider/Extender: Skipper Cliche in Treatment: 1 Diagnosis  Coding ICD-10 Codes Code Description [id number] Non-pressure chronic ulcer of right calf limited to breakdown of skin I89.0 Lymphedema, not elsewhere classified M08.67 Chronic diastolic (congestive) heart failure E11.622 Type 2 diabetes mellitus with other skin ulcer Facility Procedures CPT4 Code: [id number] Description: [id number] - WOUND CARE VISIT-LEV 2 EST PT Modifier: Quantity: 1 Physician Procedures CPT4 Code: 5809983 Description: 99214 - WC PHYS LEVEL 4 - EST PT Modifier: Quantity: 1 CPT4 Code: Description: ICD-10 Diagnosis Description L97.211 Non-pressure chronic ulcer of right calf limited to breakdown of skin I89.0 Lymphedema, not elsewhere classified J82.50 Chronic diastolic (congestive) heart failure E11.622 Type 2 diabetes mellitus with  other skin ulcer Modifier: Quantity: Electronic Signature(s) Signed: 11/16/2021 10:57:29 AM By: Worthy Keeler PA-C Entered By: Worthy Keeler on 11/16/2021 10:57:29

## 2021-11-22 ENCOUNTER — Ambulatory Visit: Admission: RE | Admit: 2021-11-22 | Payer: Medicare PPO | Source: Ambulatory Visit

## 2021-11-23 ENCOUNTER — Ambulatory Visit
Admission: RE | Admit: 2021-11-23 | Discharge: 2021-11-23 | Disposition: A | Discharge status: Home / Self Care | Payer: Medicare PPO | Source: Ambulatory Visit | Attending: Nephrology | Admitting: Nephrology

## 2021-11-23 ENCOUNTER — Other Ambulatory Visit: Payer: Self-pay

## 2021-11-23 DIAGNOSIS — R609 Edema, unspecified: Secondary | ICD-10-CM | POA: Insufficient documentation

## 2021-11-23 DIAGNOSIS — N184 Chronic kidney disease, stage 4 (severe): Secondary | ICD-10-CM | POA: Insufficient documentation

## 2021-11-23 MED ORDER — POTASSIUM CHLORIDE CRYS ER 20 MEQ PO TBCR
EXTENDED_RELEASE_TABLET | ORAL | Status: AC
  Administered 2021-11-23: 20 meq via ORAL
  Filled 2021-11-23: qty 1

## 2021-11-23 MED ORDER — POTASSIUM CHLORIDE CRYS ER 20 MEQ PO TBCR: 20.0000 meq | EXTENDED_RELEASE_TABLET | Freq: Once | ORAL | Status: AC

## 2021-11-23 MED ORDER — FUROSEMIDE 10 MG/ML IJ SOLN
INTRAMUSCULAR | Status: AC
  Filled 2021-11-23: qty 4

## 2021-11-23 MED ORDER — FUROSEMIDE 10 MG/ML IJ SOLN: 80.0000 mg | Freq: Once | INTRAMUSCULAR | Status: AC

## 2021-11-23 MED ORDER — FUROSEMIDE 10 MG/ML IJ SOLN
INTRAMUSCULAR | Status: AC
  Filled 2021-11-23: qty 2

## 2021-11-23 MED ORDER — FUROSEMIDE 10 MG/ML IJ SOLN
INTRAMUSCULAR | Status: AC
  Administered 2021-11-23: 80 mg via INTRAVENOUS
  Filled 2021-11-23: qty 4

## 2021-11-26 ENCOUNTER — Ambulatory Visit
Admission: RE | Admit: 2021-11-26 | Discharge: 2021-11-26 | Disposition: A | Discharge status: Home / Self Care | Payer: Medicare PPO | Source: Ambulatory Visit | Attending: Nephrology | Admitting: Nephrology

## 2021-11-26 ENCOUNTER — Other Ambulatory Visit: Payer: Self-pay

## 2021-11-26 DIAGNOSIS — R609 Edema, unspecified: Secondary | ICD-10-CM | POA: Insufficient documentation

## 2021-11-26 DIAGNOSIS — N184 Chronic kidney disease, stage 4 (severe): Secondary | ICD-10-CM | POA: Insufficient documentation

## 2021-11-26 MED ORDER — FUROSEMIDE 10 MG/ML IJ SOLN: 80.0000 mg | Freq: Once | INTRAMUSCULAR | Status: AC

## 2021-11-26 MED ORDER — FUROSEMIDE 10 MG/ML IJ SOLN
INTRAMUSCULAR | Status: AC
  Administered 2021-11-26: 80 mg via INTRAVENOUS
  Filled 2021-11-26: qty 8

## 2021-11-26 MED ORDER — POTASSIUM CHLORIDE CRYS ER 20 MEQ PO TBCR
EXTENDED_RELEASE_TABLET | ORAL | Status: AC
  Administered 2021-11-26: 20 meq via ORAL
  Filled 2021-11-26: qty 1

## 2021-11-26 MED ORDER — POTASSIUM CHLORIDE CRYS ER 20 MEQ PO TBCR: 20.0000 meq | EXTENDED_RELEASE_TABLET | Freq: Once | ORAL | Status: AC

## 2021-11-28 ENCOUNTER — Other Ambulatory Visit: Payer: Self-pay

## 2021-11-28 ENCOUNTER — Ambulatory Visit
Admission: RE | Admit: 2021-11-28 | Discharge: 2021-11-28 | Disposition: A | Discharge status: Home / Self Care | Payer: Medicare PPO | Source: Ambulatory Visit | Attending: Nephrology | Admitting: Nephrology

## 2021-11-28 DIAGNOSIS — R609 Edema, unspecified: Secondary | ICD-10-CM | POA: Diagnosis present

## 2021-11-28 DIAGNOSIS — N184 Chronic kidney disease, stage 4 (severe): Secondary | ICD-10-CM | POA: Insufficient documentation

## 2021-11-28 MED ORDER — FUROSEMIDE 10 MG/ML IJ SOLN
INTRAMUSCULAR | Status: AC
  Administered 2021-11-28: 80 mg via INTRAVENOUS
  Filled 2021-11-28: qty 8

## 2021-11-28 MED ORDER — POTASSIUM CHLORIDE CRYS ER 20 MEQ PO TBCR
EXTENDED_RELEASE_TABLET | ORAL | Status: AC
  Administered 2021-11-28: 20 meq via ORAL
  Filled 2021-11-28: qty 1

## 2021-11-28 MED ORDER — FUROSEMIDE 10 MG/ML IJ SOLN: 80.0000 mg | Freq: Once | INTRAMUSCULAR | Status: AC

## 2021-11-28 MED ORDER — POTASSIUM CHLORIDE CRYS ER 20 MEQ PO TBCR: 20.0000 meq | EXTENDED_RELEASE_TABLET | Freq: Once | ORAL | Status: AC

## 2021-12-03 ENCOUNTER — Telehealth: Payer: Self-pay

## 2021-12-03 NOTE — Telephone Encounter (Unsigned)
Callback to daughter Olin Hauser, she was wanting clarification on palliative care visit plan and team approach. No needs at this time. Olin Hauser grateful for information, encouraged to call back for questions

## 2021-12-18 ENCOUNTER — Other Ambulatory Visit: Payer: Medicare PPO | Admitting: Primary Care

## 2021-12-18 ENCOUNTER — Other Ambulatory Visit: Payer: Self-pay

## 2021-12-18 DIAGNOSIS — Z7409 Other reduced mobility: Secondary | ICD-10-CM

## 2021-12-18 DIAGNOSIS — N184 Chronic kidney disease, stage 4 (severe): Secondary | ICD-10-CM

## 2021-12-18 DIAGNOSIS — I890 Lymphedema, not elsewhere classified: Secondary | ICD-10-CM

## 2021-12-18 DIAGNOSIS — F0390 Unspecified dementia without behavioral disturbance: Secondary | ICD-10-CM

## 2021-12-18 DIAGNOSIS — I5032 Chronic diastolic (congestive) heart failure: Secondary | ICD-10-CM

## 2021-12-18 NOTE — Progress Notes (Signed)
Therapist, nutritional Palliative Care Consult Note Telephone: [phone]  Fax: [phone]    Date of encounter: 12/18/21 10:15 AM PATIENT NAME: [name] [name] [name] [address] [place] [address]   [phone] (home)  DOB: Feb 16, 1925 MRN: [id number] PRIMARY CARE PROVIDER:    Carlota Raspberry, MD,  [address] [place] [address] [phone]  REFERRING PROVIDER:   Carlota Raspberry, MD [address] [place],  [place] [address] [phone]  RESPONSIBLE PARTY:    Contact Information     Name Relation Home Work Mobile   [name] [name],[name] Daughter [phone]  [id number]      I met face to face with patient and family in  home. Palliative Care was asked to follow this patient by consultation request of  [name], Vanessa Kick, MD to address advance care planning and complex medical decision making. This is a follow up visit.                                   ASSESSMENT AND PLAN / RECOMMENDATIONS:   Advance Care Planning/Goals of Care: Goals include to maximize quality of life and symptom management. Patient/health care surrogate gave his/her permission to discuss.Our advance care planning conversation included a discussion about:    The value and importance of advance care planning  Experiences with loved ones who have been seriously ill or have died  Exploration of personal, cultural or spiritual beliefs that might influence medical decisions  Exploration of goals of care in the event of a sudden injury or illness  Identification of a healthcare agent - granddaughter  creation of [name] [name]  advance directive document . Decision not to resuscitate  due to poor prognosis. CODE STATUS: DNR Discussed palliative approach and pros and cons RE care plan Discussed home based services and options in our health care system Discussed palliative care services and how they can help with in home care and goals of care, as well as medical  management. Created DNR today at request of POA. Still considering MOST form and levels of intervention.  I spent 30 minutes providing this consultation. More than 50% of the time in this consultation was spent in counseling and care coordination. ---------------------------------------------------------------------------------------------------------- Symptom Management/Plan:   CKD: reviewed labs ( recent BMP) and gfr of 18. Discussed dialysis and that the burden of this treatment would likely impact her qol. At this stage ( 4) she may still have some ability to compensate. POA wants her to be comfortable and treat what they can but does not feel escalating to dialysis will be good QoL.  CHF: Patient has stable weights and not adjusting diuretic. She appears comfortable on the current regimen. Discussed using lymphedema pumps to off load fluid, limiting sodium and limiting fluids if that makes her more comfortable. POA endorses she does not drink 2 L / day even when not restricted. Today has on compression stockings as well.   Insomnia: Endorses. Has used melatonin in past but not consistently. I have recommended using again and start at 5 mg nightly, may increase to 10 mg.   Caregiver strain: Discussed community resources and what is available to assist. [facility]. I discussed with CM Precious, RN, that palliative was not the same as hospice and would not preclude home health services. They are going to recertify her for home health nursing next week. I outlined my role as PC NP  again with family, who feels this will be a helpful adjunct for home management.  Follow up Palliative Care Visit: Palliative care will continue to follow for complex medical decision making, advance care planning, and clarification of goals. Return 4 weeks or prn.  This visit was coded based on medical decision making (MDM).  PPS: 40%  HOSPICE ELIGIBILITY/DIAGNOSIS: TBD  Chief  Complaint: debility, dementia  HISTORY OF PRESENT ILLNESS:  [name] [name] [name] is a 88 y.o. year old female  with dementia, debility, CKD, CHF, lymphedema .   History obtained from review of EMR, discussion with primary team, and interview with family, facility staff/caregiver and/or [name] [name] [name].  I reviewed available labs, medications, imaging, studies and related documents from the EMR.  Records reviewed and summarized above.   ROS   General: NAD EYES: denies vision changes, has glasses  ENMT: denies dysphagia Cardiovascular: denies chest pain, endorses mild DOE Pulmonary: denies cough, denies increased SOB at rest, no dyspnea. Abdomen: endorses good appetite, denies constipation, endorses continence of bowel GU: denies dysuria, endorses continence of urine MSK:  denies  increased weakness,  no falls reported Skin: denies rashes or wounds Neurological: denies pain, endorses  insomnia Psych: Endorses positive mood Heme/lymph/immuno: denies bruises, abnormal bleeding  Physical Exam: Current and past weights:fluctuate 5 lbs. Constitutional: NAD General: frail appearing,  WNWD EYES: anicteric sclera, lids intact, no discharge  ENMT: intact hearing, oral mucous membranes moist, dentition intact CV: S1S2, murmur 3/6, RRR, + bil non pitting  LE edema Pulmonary: rales in bil bases, clear in upper lobes, no increased work of breathing, no cough, room air Abdomen: intake 75%,  no ascites GU: deferred MSK: mild  sarcopenia, moves all extremities, ambulatory with walker and assistance  Skin: warm and dry, no rashes or wounds on visible skin Neuro:  + generalized weakness,  + cognitive impairment Psych: non-anxious affect, A and O x 2-3 Hem/lymph/immuno: no widespread bruising  Thank you for the opportunity to participate in the care of [name] [name] [name].  The palliative care team will continue to follow. Please call our office at [phone] if we can be of additional assistance.   Jason Coop, NP DNP, AGPCNP-BC  COVID-19 PATIENT SCREENING TOOL Asked and negative response unless otherwise noted:   Have you had symptoms of covid, tested positive or been in contact with someone with symptoms/positive test in the past 5-10 days?

## 2021-12-25 ENCOUNTER — Ambulatory Visit
Admission: RE | Admit: 2021-12-25 | Discharge: 2021-12-25 | Disposition: A | Discharge status: Home / Self Care | Payer: Medicare PPO | Source: Ambulatory Visit | Attending: Nephrology | Admitting: Nephrology

## 2021-12-25 ENCOUNTER — Other Ambulatory Visit: Payer: Self-pay

## 2021-12-25 DIAGNOSIS — R609 Edema, unspecified: Secondary | ICD-10-CM | POA: Insufficient documentation

## 2021-12-25 MED ORDER — SODIUM CHLORIDE FLUSH 0.9 % IV SOLN
INTRAVENOUS | Status: AC
  Filled 2021-12-25: qty 10

## 2021-12-25 MED ORDER — POTASSIUM CHLORIDE CRYS ER 20 MEQ PO TBCR: 20.0000 meq | EXTENDED_RELEASE_TABLET | Freq: Once | ORAL | Status: AC

## 2021-12-25 MED ORDER — FUROSEMIDE 10 MG/ML IJ SOLN
INTRAMUSCULAR | Status: AC
  Administered 2021-12-25: 80 mg via INTRAVENOUS
  Filled 2021-12-25: qty 8

## 2021-12-25 MED ORDER — FUROSEMIDE 10 MG/ML IJ SOLN
80.0000 mg | Freq: Once | INTRAMUSCULAR | Status: AC
Start: 2021-12-25 — End: 2021-12-25

## 2021-12-25 MED ORDER — POTASSIUM CHLORIDE CRYS ER 20 MEQ PO TBCR
EXTENDED_RELEASE_TABLET | ORAL | Status: AC
  Administered 2021-12-25: 20 meq via ORAL
  Filled 2021-12-25: qty 1

## 2021-12-26 ENCOUNTER — Ambulatory Visit: Payer: Medicare PPO

## 2021-12-27 ENCOUNTER — Ambulatory Visit
Admission: RE | Admit: 2021-12-27 | Discharge: 2021-12-27 | Disposition: A | Discharge status: Home / Self Care | Payer: Medicare PPO | Source: Ambulatory Visit | Attending: Nephrology | Admitting: Nephrology

## 2021-12-27 DIAGNOSIS — R609 Edema, unspecified: Secondary | ICD-10-CM | POA: Diagnosis not present

## 2021-12-27 MED ORDER — POTASSIUM CHLORIDE CRYS ER 20 MEQ PO TBCR: 20.0000 meq | EXTENDED_RELEASE_TABLET | Freq: Once | ORAL | Status: AC

## 2021-12-27 MED ORDER — FUROSEMIDE 10 MG/ML IJ SOLN: 80.0000 mg | Freq: Once | INTRAMUSCULAR | Status: AC

## 2021-12-27 MED ORDER — FUROSEMIDE 10 MG/ML IJ SOLN
INTRAMUSCULAR | Status: AC
  Administered 2021-12-27: 13:00:00 80 mg via INTRAVENOUS
  Filled 2021-12-27: qty 8

## 2021-12-27 MED ORDER — POTASSIUM CHLORIDE CRYS ER 20 MEQ PO TBCR
EXTENDED_RELEASE_TABLET | ORAL | Status: AC
  Administered 2021-12-27: 13:00:00 20 meq via ORAL
  Filled 2021-12-27: qty 1

## 2021-12-28 ENCOUNTER — Ambulatory Visit: Admission: RE | Admit: 2021-12-28 | Payer: Medicare PPO | Source: Ambulatory Visit

## 2021-12-31 ENCOUNTER — Ambulatory Visit
Admission: RE | Admit: 2021-12-31 | Discharge: 2021-12-31 | Disposition: A | Discharge status: Home / Self Care | Payer: Medicare PPO | Source: Ambulatory Visit | Attending: Nephrology | Admitting: Nephrology

## 2021-12-31 ENCOUNTER — Other Ambulatory Visit: Payer: Self-pay

## 2021-12-31 DIAGNOSIS — R609 Edema, unspecified: Secondary | ICD-10-CM | POA: Insufficient documentation

## 2021-12-31 MED ORDER — FUROSEMIDE 10 MG/ML IJ SOLN: 80.0000 mg | Freq: Once | INTRAMUSCULAR | Status: AC

## 2021-12-31 MED ORDER — SODIUM CHLORIDE FLUSH 0.9 % IV SOLN
INTRAVENOUS | Status: AC
  Filled 2021-12-31: qty 10

## 2021-12-31 MED ORDER — FUROSEMIDE 10 MG/ML IJ SOLN
INTRAMUSCULAR | Status: AC
  Administered 2021-12-31: 80 mg via INTRAVENOUS
  Filled 2021-12-31: qty 8

## 2021-12-31 MED ORDER — POTASSIUM CHLORIDE CRYS ER 20 MEQ PO TBCR: 20.0000 meq | EXTENDED_RELEASE_TABLET | Freq: Once | ORAL | Status: AC

## 2021-12-31 MED ORDER — POTASSIUM CHLORIDE CRYS ER 20 MEQ PO TBCR
EXTENDED_RELEASE_TABLET | ORAL | Status: AC
  Administered 2021-12-31: 20 meq via ORAL
  Filled 2021-12-31: qty 2

## 2022-01-08 ENCOUNTER — Other Ambulatory Visit: Payer: Medicare PPO | Admitting: Primary Care

## 2022-01-08 ENCOUNTER — Other Ambulatory Visit: Payer: Self-pay

## 2022-01-08 DIAGNOSIS — G4700 Insomnia, unspecified: Secondary | ICD-10-CM

## 2022-01-08 DIAGNOSIS — F0390 Unspecified dementia without behavioral disturbance: Secondary | ICD-10-CM

## 2022-01-08 DIAGNOSIS — I890 Lymphedema, not elsewhere classified: Secondary | ICD-10-CM

## 2022-01-08 DIAGNOSIS — I5032 Chronic diastolic (congestive) heart failure: Secondary | ICD-10-CM

## 2022-01-08 DIAGNOSIS — N184 Chronic kidney disease, stage 4 (severe): Secondary | ICD-10-CM

## 2022-01-08 DIAGNOSIS — Z515 Encounter for palliative care: Secondary | ICD-10-CM

## 2022-01-08 DIAGNOSIS — Z7409 Other reduced mobility: Secondary | ICD-10-CM

## 2022-01-08 NOTE — Progress Notes (Signed)
? ? ?Manufacturing engineer ?Community Palliative Care Consult Note ?Telephone: [phone]  ?Fax: [phone]  ? ? ?Date of encounter: 01/08/22 ?2:34 PM ?PATIENT NAME: [name] [name] ?[place] [address]   ?[phone] (home)  ?DOB: 09/05/1925 ?MRN: [id number] ?PRIMARY CARE PROVIDER:    ?[name], [name], MD,  ?[address] ?[place] [address] ?[phone] ? ?REFERRING PROVIDER:   ?[name], [name], MD ?[address] ?[address] ?[place],   [address] ?[phone] ? ?RESPONSIBLE PARTY:    ?Contact Information   ? ? Name Relation Home Work Mobile  ? Rich,Pamela Daughter [phone]  [phone]  ? ?  ? ? ?I met face to face with patient and family in  home. Palliative Care was asked to follow this patient by consultation request of  [name], [name], MD to address advance care planning and complex medical decision making. This is a follow up visit. ? ?                                 ASSESSMENT AND PLAN / RECOMMENDATIONS:  ? ?Advance Care Planning/Goals of Care: Goals include to maximize quality of life and symptom management. Patient/health care surrogate gave his/her permission to discuss.Our advance care planning conversation included a discussion about:    ?The value and importance of advance care planning  ?Exploration of personal, cultural or spiritual beliefs that might influence medical decisions  ?Granddaughter has not finished most, states it is difficult to do. No questions. ?CODE STATUS: DNR ? ?Symptom Management/Plan: ?  ?Insomnia: Continues to endorse due to day time napping. Encouraged to keep awake in the day time so as to get on a diurnal schedule. Recommended melatonin 5 mg. POA states trazodone was not encouraged by other providers. ? ?Edema: Has LE edema and pumps, discussed the physiology of edema and sodium, and of end stage chf. Pt endorses 3-4 + LE edema and ascites. ? ?Disease process: CHF and CKD discussed in context of edema and  discomfort of that. She may have less ability to compensate as renal function declines. ? ?Caregiver strain: POA cares for patient in her home. Patient reticent to go out and do activities, we disussed them being able to go out. Mobility does impair this as she has such heavy legs due to edema. Discussed w/c Lucianne Lei for going out, not able to use acta for going to sr center in [facility]. ? ?Follow up Palliative Care Visit: Palliative care will continue to follow for complex medical decision making, advance care planning, and clarification of goals. Return 6-8 weeks or prn. ? ?I spent 60 minutes providing this consultation. More than 50% of the time in this consultation was spent in counseling and care coordination. ? ?This visit was coded based on medical decision making (MDM). ? ?PPS: 40% ? ?HOSPICE ELIGIBILITY/DIAGNOSIS: TBD ? ?Chief Complaint: edema, Sob at hs ? ?HISTORY OF PRESENT ILLNESS:  [name] [name] is a 88 y.o. year old female  with CHF, edema and lymphedema, SOB at hs when prone, CKD IV, dementia . Patient seen today to review palliative care needs to include medical decision making and advance care planning as appropriate.  ? ?History obtained from review of EMR, discussion with primary team, and interview with family, facility staff/caregiver and/or [name] [name].  ?I reviewed available labs, medications, imaging, studies and related documents from the EMR.  Records reviewed and summarized above.  ? ?ROS ? ? ?General: NAD ?EYES: denies  vision changes, has glasses  ?ENMT: denies dysphagia ?Cardiovascular: denies chest pain, endorses iDOE ?Pulmonary: denies cough, endorses increased SOB ?Abdomen: endorses fair appetite, denies constipation, endorses continence of bowel ?GU: denies dysuria, endorses incontinence of urine ?MSK:  endorses i increased weakness,  no falls reported ?Skin: denies rashes or wounds ?Neurological: denies pain, endorses  insomnia ?Psych: Endorses positive mood ? ?Physical Exam: ?Current  and past weights: 174 lbs, down from 177 lbs ?Constitutional: NAD ?General: frail appearing ?EYES: anicteric sclera, lids intact, no discharge  ?ENMT: hard of hearing, oral mucous membranes moist, dentition intact ?CV: S1S2, RRR, 3-4+ bil  LE edema ?Pulmonary: LCTA, no increased work of breathing, no cough, room air ?Abdomen: intake 50%, normo-active BS + 4 quadrants, firm  and non tender, + ascites ?MSK: +sarcopenia, moves all extremities, non ambulatory ?Skin: warm and dry, no rashes or wounds on visible skin ?Neuro:  + generalized weakness,  + cognitive impairment, non-anxious affect ? ? ?Thank you for the opportunity to participate in the care of [name] [name].  The palliative care team will continue to follow. Please call our office at [phone] if we can be of additional assistance.  ? ?Jason Coop, NP DNP, AGPCNP-BC ? ?COVID-19 PATIENT SCREENING TOOL ?Asked and negative response unless otherwise noted:  ? ?Have you had symptoms of covid, tested positive or been in contact with someone with symptoms/positive test in the past 5-10 days?  ? ?

## 2022-01-15 ENCOUNTER — Ambulatory Visit: Payer: Medicare PPO | Attending: Physician Assistant | Admitting: Occupational Therapy

## 2022-01-15 ENCOUNTER — Other Ambulatory Visit: Payer: Self-pay

## 2022-01-15 DIAGNOSIS — I890 Lymphedema, not elsewhere classified: Secondary | ICD-10-CM | POA: Diagnosis not present

## 2022-01-16 ENCOUNTER — Encounter: Payer: Self-pay | Admitting: Occupational Therapy

## 2022-01-17 ENCOUNTER — Encounter: Payer: Self-pay | Admitting: Occupational Therapy

## 2022-01-17 NOTE — Therapy (Signed)
[facility] ?[place], [place], [address] ?Phone: [phone]   Fax:  [phone] ? ?Occupational Therapy Evaluation ? ?Patient Details  ?Name: [name] [name] ?MRN: [id number] ?Date of Birth: 18-Jun-1925 ?Referring Provider (OT): [name] [name], [place] ? ? ?Encounter Date: 01/15/2022 ? ? OT End of Session - 01/17/22 1259   ? ? Visit Number 1   ? Number of Visits 36   ? Date for OT Re-Evaluation 04/15/22   ? OT Start Time 0204   ? OT Stop Time 0304   ? OT Time Calculation (min) 60 min   ? Activity Tolerance Patient tolerated treatment well   ? Behavior During Therapy [name] for tasks assessed/performed   ? ?  ?  ? ?  ? ? ?Past Medical History:  ?Diagnosis Date  ? Anemia   ? CHF (congestive heart failure) ([place])   ? Chronic kidney disease   ? Dementia ([place])   ? Diabetes mellitus without complication ([place])   ? GIB (gastrointestinal bleeding)   ? Hyperlipidemia   ? Hypertension   ? Lymphedema   ? OSA on CPAP   ? Osteoarthritis   ? Vitamin D deficiency   ? ? ?Past Surgical History:  ?Procedure Laterality Date  ? ABDOMINAL HYSTERECTOMY    ? ? ?There were no vitals filed for this visit. ? ? Subjective Assessment - 01/17/22 0812   ? ? Subjective  [name] [name] is referred to Occupational Therapy by [name] Cos, PA for evaluation and treatment of BLE lymphedema in the context of several contributing conditions and complex health issues. [name] [name] is accompanied by her granddaughter and primary caregiver, [name] [name]. Together they provide history stating Pt received formal lymphedema diagnosis ~ 4 years ago. Pt knows of no known precipitating event, and denies known family hx of limb swelling. Pt explains she underwent lymphedema therapy by a therapist who came to her home for about a month. She performed manual therapy, taught [name] [name] to wrap. Pt also obtained a basic sequential pneumatic compression device, or "pump" which she continues to use daily. At present Pt wears thigh  length, off -the-shelf, circular knit compression stockings bilaterally providing 15-20 mmHg compression. This is considered non-medical grade compression. Garments are bunching into deep skin folds at ankles causing a tourniquet effect this morning. Pt states her goal for OT is, " to get the swelling down in her legs".   ? Patient is accompanied by: Family member   ? 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 Limitations difficulty walking, impaired transfers/ functional mobility, limited AROM, impaired sensation, decreased BLE AROM , generalized weakness, impaired basic and instrumental ADLs, limited functional performance of productivfe and leisure activities, limit social participation   ? Repetition Increases Symptoms   ? Special Tests +Stemmer, Intake FOTO TBA   ? Currently in Pain? Yes   ? Pain Score --   unable to rate numerically  ? Pain Orientation Left;Right   ? Pain Descriptors / Indicators Tingling;Tiring;Heaviness;Numbness;Discomfort;Tender;Tightness;Pressure   ? Pain Type Chronic pain   ? Pain Onset Other (comment)   4 yrs ago w onset of LE  ? Aggravating Factors  dependent positioning   ? Pain Relieving Factors elevation   ? Effect of Pain on Daily Activities chronic , progressive BLE LE and associated pain ilimitys basic and instrumental ADLs,  productivfe and leisure activities, social participation   ? ?  ?  ? ?  ? ? ? ? [facility] OT Assessment - 01/17/22 0831   ? ?  ? Assessment  ? Medical Diagnosis Moderate, stage II, BLE lymphedema in the context of CKD, CHF and OSA   ? Referring Provider (OT) [name] Cos, PA   ? Onset Date/Surgical Date --   s/p 4 years after predisone injection  ? Prior Therapy yes   ?  ? Precautions  ? Precautions Fall;Other (comment)   Lymphedema Precautions-cardio-pulmonary  ?  ? Home  Environment  ? Family/patient expects  to be discharged to: Private residence   ? Type of Home House   ? Home Layout One level   ?  ? Prior Function  ? Vocation Retired   former Quarry manager  ? Vocation Requirements sedentary   ?  ? IADL  ? Prior Level of Function Shopping needs assist   ? Shopping Completely unable to shop   ? Prior Level of Function Light Housekeeping needs assist   ? Light Housekeeping Does not participate in any housekeeping tasks   ? Prior Level of Function Meal Prep needs assist   ? Meal Prep Needs to have meals prepared and served   ? Prior Level of Function Community Mobility needs assist   ? Community Mobility Relies on family or friends for transportation   ?  ? Mobility  ? Mobility Status Needs assist   ? Mobility Status Comments Mod-Max A x 1 stand pivot from wc to Rx bed   ?  ? Vision - History  ? Baseline Vision Wears glasses all the time   ?  ? Activity Tolerance  ? Activity Tolerance Comments decreased   ?  ? Cognition  ? Overall Cognitive Status History of cognitive impairments - at baseline   ? Memory Impaired   ?  ? Posture/Postural Control  ? Posture/Postural Control Postural limitations   ? Postural Limitations Rounded Shoulders;Forward head;Increased thoracic kyphosis;Posterior pelvic tilt;Flexed trunk   ?  ? Sensation  ? Light Touch Appears Intact   ?  ? ROM / Strength  ? AROM / PROM / Strength AROM;PROM;Strength   ?  ? AROM  ? Overall AROM  Deficits;Other (comment)   due to swelling and skin approximation - ankles. knees and feet  ? AROM Assessment Site Hip;Knee;Ankle   ?  ? Strength  ? Overall Strength Deficits   ? ?  ?  ? ?  ? ? ? ? ? ? ? ? ? ? ? OT Treatments/Exercises (OP) - 01/17/22 9767   ? ?  ? Transfers  ? Transfers Sit to Stand;Stand to Sit   ? Sit to Stand 3: Mod assist;1: +1 Total assist   ? Stand to Sit 3: Mod assist;Uncontrolled descent   ?  ? ADLs  ? Overall ADLs impaired   ? Grooming impaired   ? UB Dressing impaired   ? LB Dressing impaired-difficulty fitting LB  clothing and street shoes 2/2 swelling   ? Bathing dependent-difficulty reaching below knees   ? Functional Mobility impaired transfers   ? Cooking dependent   ? Home Maintenance dependent   ? Writing '   ? Leisure family and friends, TV, crotchet   ? ADL Education Given Yes   ? ?  ?  ? ?  ? ?Moderate,- Severe, Stage II, BLE Lymphedema 2/2 chronic systemic fluid overload on lymphatic system in the context of CHF,  CKD, HTN and OSA ? ?Skin  ?Description Hyper-Keratosis Peau? de Orange Shiny Tight Fibrotic/ ?Indurated Fatty Doughy Spongy/ boggy  ? x   x x   spongy  ? ?Skin dry Flaky WNL Macerated  ? x x    ? ?Color Redness Present Varicosities Blanching Hemosiderin Staining Mottled  ?      ?  ? ?Odor Malodorous Yeast Fungal infection  Absent  ?    x  ? ?Temperature Warm Cool wnl  ?  x  ?  ? ?Pitting Edema ? ? 1+ 2+ 3+ 4+ Non-pitting  ?   ?   ? x  ? ?Girth Symmetrical Asymmetrical                   Distribution  ?   ?L>R  ?Ankles to distal thighs ? ? ? ? ? ? ?  ? ?Stemmer Sign Positive Negative  ? strong   ? ?Lymphorrhea History Of:  Present Absent  ? x   ?  ? ?Wounds History Of Present Absent Venous Arterial Pressure Mechanical  ? x       ?  ? ?Signs of Infection Redness Warmth Erythema Acute Swelling Drainage Borders  ?        ?        ? ? ?Sensation Light Touch Deep pressure Hypersensitivty  ? Present Impaired Present Impaired Absent Impaired  ?  x      ? ? ?Nails WNL ?  Fungus nail dystrophy  ?     ? ?Hair Growth Symmetrical Asymmetrical  ?    ? ?Skin Creases Base of toes ? Ankles ? ? Base of Fingers knees ?      Abdominal pannus Thigh Lobules ? Face/neck  ? x x  x     ? ? ? ? ? ? ? ? OT Education - 01/16/22 1609   ? ? Education Details Provided Pt education regarding lymphatic structure and function, etiology, onset patterns and stages of progression. Taught interaction between blood circulatory system and lymphatic circulation.Discussed  impact of gravity and co-morbidities on lymphatic function. Outlined Complete  Decongestive Therapy (CDT)  as standard of care and provided in depth information regarding 4 primary components of Intensive and Self Management Phases, including Manual Lymph Drainage (MLD), compress

## 2022-01-18 ENCOUNTER — Ambulatory Visit: Payer: Medicare PPO | Attending: Family

## 2022-01-21 ENCOUNTER — Ambulatory Visit
Admission: RE | Admit: 2022-01-21 | Discharge: 2022-01-21 | Disposition: A | Discharge status: Home / Self Care | Payer: Medicare PPO | Source: Ambulatory Visit | Attending: Family | Admitting: Family

## 2022-01-21 DIAGNOSIS — R609 Edema, unspecified: Secondary | ICD-10-CM | POA: Diagnosis present

## 2022-01-21 MED ORDER — FUROSEMIDE 10 MG/ML IJ SOLN
80.0000 mg | Freq: Once | INTRAMUSCULAR | Status: AC
  Administered 2022-01-21: 80 mg via INTRAVENOUS

## 2022-01-21 MED ORDER — POTASSIUM CHLORIDE CRYS ER 20 MEQ PO TBCR
20.0000 meq | EXTENDED_RELEASE_TABLET | Freq: Once | ORAL | Status: AC
  Administered 2022-01-21: 20 meq via ORAL

## 2022-01-23 ENCOUNTER — Ambulatory Visit
Admission: RE | Admit: 2022-01-23 | Discharge: 2022-01-23 | Disposition: A | Discharge status: Home / Self Care | Payer: Medicare PPO | Source: Ambulatory Visit | Attending: Family | Admitting: Family

## 2022-01-23 DIAGNOSIS — R609 Edema, unspecified: Secondary | ICD-10-CM | POA: Insufficient documentation

## 2022-01-23 MED ORDER — FUROSEMIDE 10 MG/ML IJ SOLN
80.0000 mg | Freq: Once | INTRAMUSCULAR | Status: AC
Start: 2022-01-23 — End: 2022-01-23
  Administered 2022-01-23: 80 mg via INTRAVENOUS

## 2022-01-23 MED ORDER — POTASSIUM CHLORIDE CRYS ER 20 MEQ PO TBCR
20.0000 meq | EXTENDED_RELEASE_TABLET | Freq: Once | ORAL | Status: AC
  Administered 2022-01-23: 20 meq via ORAL

## 2022-01-24 ENCOUNTER — Ambulatory Visit: Payer: Medicare PPO | Admitting: Occupational Therapy

## 2022-01-25 ENCOUNTER — Ambulatory Visit
Admission: RE | Admit: 2022-01-25 | Discharge: 2022-01-25 | Disposition: A | Discharge status: Home / Self Care | Payer: Medicare PPO | Source: Ambulatory Visit | Attending: Nephrology | Admitting: Nephrology

## 2022-01-25 DIAGNOSIS — R609 Edema, unspecified: Secondary | ICD-10-CM | POA: Insufficient documentation

## 2022-01-25 DIAGNOSIS — N184 Chronic kidney disease, stage 4 (severe): Secondary | ICD-10-CM | POA: Diagnosis not present

## 2022-01-25 MED ORDER — POTASSIUM CHLORIDE CRYS ER 20 MEQ PO TBCR
EXTENDED_RELEASE_TABLET | ORAL | Status: AC
  Administered 2022-01-25: 20 meq via ORAL
  Filled 2022-01-25: qty 1

## 2022-01-25 MED ORDER — FUROSEMIDE 10 MG/ML IJ SOLN
INTRAMUSCULAR | Status: AC
  Administered 2022-01-25: 80 mg via INTRAVENOUS
  Filled 2022-01-25: qty 8

## 2022-01-25 MED ORDER — FUROSEMIDE 10 MG/ML IJ SOLN: 80.0000 mg | Freq: Once | INTRAMUSCULAR | Status: AC

## 2022-01-25 MED ORDER — POTASSIUM CHLORIDE CRYS ER 20 MEQ PO TBCR: 20.0000 meq | EXTENDED_RELEASE_TABLET | Freq: Once | ORAL | Status: AC

## 2022-01-25 MED ORDER — POTASSIUM CHLORIDE CRYS ER 20 MEQ PO TBCR
EXTENDED_RELEASE_TABLET | ORAL | Status: AC
  Filled 2022-01-25: qty 1

## 2022-01-28 ENCOUNTER — Ambulatory Visit: Payer: Medicare PPO | Attending: Physician Assistant | Admitting: Occupational Therapy

## 2022-01-28 DIAGNOSIS — I890 Lymphedema, not elsewhere classified: Secondary | ICD-10-CM | POA: Diagnosis present

## 2022-01-29 NOTE — Therapy (Signed)
?[facility] ?[place], [place], [address] ?Phone: [phone]   Fax:  [phone] ? ?Occupational Therapy Treatment ? ?Patient Details  ?Name: [name] [name] ?MRN: [id number] ?Date of Birth: 05-25-1925 ?Referring Provider (OT): [name], [place] ? ? ?Encounter Date: 01/28/2022 ? ? OT End of Session - 01/28/22 1428   ? ? Visit Number 2   ? Number of Visits 36   ? Date for OT Re-Evaluation 04/15/22   ? OT Start Time 0220   ? OT Stop Time 6606   ? OT Time Calculation (min) 55 min   ? Activity Tolerance Patient tolerated treatment well;No increased pain   ? Behavior During Therapy [facility] for tasks assessed/performed   ? ?  ?  ? ?  ? ? ?Past Medical History:  ?Diagnosis Date  ? Anemia   ? CHF (congestive heart failure) ([place])   ? Chronic kidney disease   ? Dementia ([place])   ? Diabetes mellitus without complication ([place])   ? GIB (gastrointestinal bleeding)   ? Hyperlipidemia   ? Hypertension   ? Lymphedema   ? OSA on CPAP   ? Osteoarthritis   ? Vitamin D deficiency   ? ? ?Past Surgical History:  ?Procedure Laterality Date  ? ABDOMINAL HYSTERECTOMY    ? ? ?There were no vitals filed for this visit. ? ? Subjective Assessment - 01/29/22 0822   ? ? Subjective  Ms. Mansouri and her granddaughter, Olin Hauser, are checked in 20 minutes late for a 60 minute OT session. Pt arrives in a transport wc. Pt is assisted by pamela to complete stand pivot transfer onto treatment bed. She expresses fear of falling. She requires Max A x 2  for bed mobility. Pt reports leg pain but does not rate numerically. OT discussed importance of being on time for sessions      for optimal outcome.   ? Patient is accompanied by: Family member   ? 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 Limitations difficulty walking, impaired transfers/  functional mobility, limited AROM, impaired sensation, decreased BLE AROM , generalized weakness, impaired basic and instrumental ADLs, limited functional performance of productivfe and leisure activities, limit social participation   ? Repetition Increases Symptoms   ? Special Tests +Stemmer, Intake FOTO TBA   ? Pain Onset Other (comment)   4 yrs ago w onset of LE  ? ?  ?  ? ?  ? ? ? ? ? ? LYMPHEDEMA/ONCOLOGY QUESTIONNAIRE - 01/29/22 0826   ? ?  ? Lymphedema Assessments  ? Lymphedema Assessments Lower extremities   ?  ? Right Lower Extremity Lymphedema  ? Other RLE  A-D limb volume = 4436.7 ml.   ?  ? Left Lower Extremity Lymphedema  ? Other LLE A-D limb volume = 4458.9 ml.   ? Other Limb volume differential = 0.52%, L>R   ? Other L dominanat   ? ?  ?  ? ?  ? ? ? ? ? ? ? ? ? ? ? ? ? ? ? ? ? ? OT Education - 01/29/22 0828   ? ? Education Details Continued Pt/ CG edu for lymphedema self care  and home program throughout session. Topics include multilayer, gradient compression wrapping, simple self-MLD, therapeutic lymphatic pumping exercises, skin/nail care, risk reduction factors and LE  precautions, compression garments/recommendations and wear and care schedule and compression garment donning / doffing using assistive devices. All questions answered to the Pt's satisfaction, and Pt demonstrates understanding by report.   ? Person(s) Educated Patient;Other (comment)   granddaughter, Olin Hauser  ? Methods Explanation;Demonstration;Tactile cues;Verbal cues;Handout   ? Comprehension Verbalized understanding;Returned demonstration;Need further instruction   ? ?  ?  ? ?  ? ? ? ? ? ? OT Long Term Goals - 01/17/22 1248   ? ?  ? OT LONG TERM GOAL #1  ? Title With Max caregiver assistance Pt will demonstrate understanding of lymphedema prevention strategies by identifying and discussing 5 precautions using printed reference (modified assistance) to reduce risk of progression and to limit infection risk.   ? Baseline dependent    ? Time 4   ? Period Days   ? Status New   ? Target Date --   4th OT Rx visit  ?  ? OT LONG TERM GOAL #2  ? Title With Maximum caregiver assistance Pt will be able to apply multilayer, knee length, compression wraps using gradient techniques to decrease limb volume, to limit infection risk, and to limit lymphedema progression.   ? Baseline dependent   ? Time 4   ? Period Days   ? Status New   ? Target Date --   4th OT visit  ?  ? OT LONG TERM GOAL #3  ? Title Pt will achieve at least a 10% limb volume reduction below the knee bilaterally to return limb to more normal size and shape, to limit infection risk, to decrease pain, to improve function, and to limit lymphedema progression.   ? Baseline dependent   ? Time 12   ? Period Weeks   ? Status New   ? Target Date 04/15/22   ?  ? OT LONG TERM GOAL #4  ? Title With Max caregiver assistance Pt will achieve and sustain a least 85% compliance with all 4 LE self-care home program components throughout Intensive Phase CDT, including modified simple self-MLD, daily skin inspection and care, lymphatic pumping the ex, 23/7 compression wraps to optimize limb volume reductions, to limit lymphedema progression and to limit further functional decline.   ? Baseline dependent   ? Time 12   ? Period Weeks   ? Status New   ? Target Date 04/15/22   ?  ? OT LONG TERM GOAL #5  ? Title Given this patient?s low Intake score of TBA on the functional outcomes FOTO tool, patient will experience an increase in function of 3 points to improve basic and instrumental ADLs performance, including lymphedema self-care.   ? Baseline TBA   ? Time 12   ? Period Weeks   ? Status New   ? Target Date 04/17/22   ? ?  ?  ? ?  ? ? ? ? ? ? ? ? Plan - 01/29/22 0817   ? ? Clinical Impression Statement Initial bilateral limb volume volumetrics reveals symetrical leg swelling with limb volume differential measuring 0.52%, L>R. Applied LLE multilayer, knee length compression wrap using short stretch bandages from  base of toes to below the knee. Modified existing shoe by adding 2 vecro straps to extend closure to limit falls risk. Cont as per POC. Teach CG to apply wraps next visit.   ? OT Occupational Profile and History Comprehensive Assessment- Review of records and extensive additional review of physical, cognitive, psychosocial history related to current functional performance   ? Occupational performance  deficits (Please refer to evaluation for details): ADL's;IADL's;Rest and Sleep;Work;Leisure;Social Participation   ? Body Structure / Function / Physical Skills Decreased knowledge of use of DME;Decreased knowledge of precautions;Skin integrity;Edema;ADL;ROM;Mobility;Pain   ? Rehab Potential Good   ? Clinical Decision Making Multiple treatment options, significant modification of task necessary   ? Comorbidities Affecting Occupational Performance: Presence of comorbidities impacting occupational performance   ? Modification or Assistance to Complete Evaluation  Max significant modification of tasks or assist is necessary to complete   ? OT Frequency 2x / week   and PRN for follow-along  ? OT Duration 12 weeks   and PRN for caregiver and f/along support  ? OT Treatment/Interventions Self-care/ADL training;DME and/or AE instruction;Manual lymph drainage;Therapeutic activities;Therapeutic exercise;Functional Mobility Training;Patient/family education;Manual Therapy   ? Plan Complete Decongestive therapy (CDT) to one limb at a time to limit falls risk and maximize function Wrap to below the knees to limit falls risk. : MLD, skin care to limit infection risk, lymphatic pumping ther ex, compression wrapping then appropriate compression garments/ device. Pt will require daily assistance from caregiver to participate in clinical program and home program due to limited mobility, cognition and functional ADLs performance.   ? Consulted and Agree with Plan of Care Patient;Family member/caregiver   ? Family Member Consulted  Granddaughter, Olin Hauser   ? ?  ?  ? ?  ? ? ?Patient will benefit from skilled therapeutic intervention in order to improve the following deficits and impairments:   ?Body Structure / Function / Physical Skills: Dec

## 2022-01-29 NOTE — Patient Instructions (Signed)

## 2022-01-31 ENCOUNTER — Ambulatory Visit: Payer: Medicare PPO | Admitting: Occupational Therapy

## 2022-01-31 DIAGNOSIS — I890 Lymphedema, not elsewhere classified: Secondary | ICD-10-CM

## 2022-01-31 NOTE — Therapy (Signed)
[place] [facility] ?[place], [place], [address] ?Phone: [phone]   Fax:  [phone] ? ?Occupational Therapy Treatment ? ?Patient Details  ?Name: [name] [name] ?MRN: [id number] ?Date of Birth: 08/30/1925 ?Referring Provider (OT): [name], [place] ? ? ?Encounter Date: 01/31/2022 ? ? OT End of Session - 01/31/22 1606   ? ? Visit Number 3   ? Number of Visits 36   ? Date for OT Re-Evaluation 04/15/22   ? OT Start Time 0210   ? OT Stop Time 0305   ? OT Time Calculation (min) 55 min   ? Activity Tolerance Patient tolerated treatment well;No increased pain   ? Behavior During Therapy [facility] for tasks assessed/performed   ? ?  ?  ? ?  ? ? ?Past Medical History:  ?Diagnosis Date  ? Anemia   ? CHF (congestive heart failure) ([place])   ? Chronic kidney disease   ? Dementia ([place])   ? Diabetes mellitus without complication ([place])   ? GIB (gastrointestinal bleeding)   ? Hyperlipidemia   ? Hypertension   ? Lymphedema   ? OSA on CPAP   ? Osteoarthritis   ? Vitamin D deficiency   ? ? ?Past Surgical History:  ?Procedure Laterality Date  ? ABDOMINAL HYSTERECTOMY    ? ? ?There were no vitals filed for this visit. ? ? Subjective Assessment - 01/31/22 1609   ? ? Subjective  Ms. Salada presents for OT Rx session 2/36 to address BLE lymphedema. Pt arrives in a transport wc. Pt reports bandages did not bother her one bit. She tolerated them without increased leg pain or difficulty walking.   ? Patient is accompanied by: Family member   ? 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 Limitations difficulty walking, impaired transfers/ functional mobility, limited AROM, impaired sensation, decreased BLE AROM , generalized weakness, impaired basic and instrumental ADLs, limited functional performance of productivfe and leisure  activities, limit social participation   ? Repetition Increases Symptoms   ? Special Tests +Stemmer, Intake FOTO TBA   ? Pain Onset Other (comment)   4 yrs ago w onset of LE  ? ?  ?  ? ?  ? ? ? ? ? ? ? ? ? ? ? ? ? ? ? OT Treatments/Exercises (OP) - 01/31/22 1610   ? ?  ? ADLs  ? ADL Education Given Yes   ?  ? Manual Therapy  ? Manual Therapy Compression Bandaging   ? ?  ?  ? ?  ? ? ? ? ? ? ? ? ? OT Education - 01/31/22 1610   ? ? Education Details Emphasis of Pt edu on multilayer short stretch compression wrapping vs long stretch wraps. How short stretch wraps move  lymphatic congestion by accentuating pumping action of leg muscles. Demonstrated application and educated on wear and care routines.   ? Person(s) Educated Patient;Other (comment)   granddaughter, Olin Hauser  ? Methods Explanation;Demonstration;Tactile cues;Verbal cues;Handout   ? Comprehension Verbalized understanding;Returned demonstration;Need further instruction   ? ?  ?  ? ?  ? ? ? ? ? ? OT Long Term Goals - 01/17/22 1248   ? ?  ? OT LONG TERM GOAL #1  ? Title With Max caregiver assistance Pt will demonstrate understanding of lymphedema prevention strategies by identifying and  discussing 5 precautions using printed reference (modified assistance) to reduce risk of progression and to limit infection risk.   ? Baseline dependent   ? Time 4   ? Period Days   ? Status New   ? Target Date --   4th OT Rx visit  ?  ? OT LONG TERM GOAL #2  ? Title With Maximum caregiver assistance Pt will be able to apply multilayer, knee length, compression wraps using gradient techniques to decrease limb volume, to limit infection risk, and to limit lymphedema progression.   ? Baseline dependent   ? Time 4   ? Period Days   ? Status New   ? Target Date --   4th OT visit  ?  ? OT LONG TERM GOAL #3  ? Title Pt will achieve at least a 10% limb volume reduction below the knee bilaterally to return limb to more normal size and shape, to limit infection risk, to decrease pain, to  improve function, and to limit lymphedema progression.   ? Baseline dependent   ? Time 12   ? Period Weeks   ? Status New   ? Target Date 04/15/22   ?  ? OT LONG TERM GOAL #4  ? Title With Max caregiver assistance Pt will achieve and sustain a least 85% compliance with all 4 LE self-care home program components throughout Intensive Phase CDT, including modified simple self-MLD, daily skin inspection and care, lymphatic pumping the ex, 23/7 compression wraps to optimize limb volume reductions, to limit lymphedema progression and to limit further functional decline.   ? Baseline dependent   ? Time 12   ? Period Weeks   ? Status New   ? Target Date 04/15/22   ?  ? OT LONG TERM GOAL #5  ? Title Given this patient?s low Intake score of TBA on the functional outcomes FOTO tool, patient will experience an increase in function of 3 points to improve basic and instrumental ADLs performance, including lymphedema self-care.   ? Baseline TBA   ? Time 12   ? Period Weeks   ? Status New   ? Target Date 04/17/22   ? ?  ?  ? ?  ? ? ? ? ? ? ? ? Plan - 01/31/22 1607   ? ? Clinical Impression Statement Emphasis of session on caregiver and Pt edu for multilayer gradient compression wrapping from base of toes to tibial tuberosity to reduce limb volume and decongest lymphatics. By end of session CG is able to apply wraps with mod assist. She made a cell phone video to use as a reference this weekend. Good return. Reviewed bandaging schedule.   ? OT Occupational Profile and History Comprehensive Assessment- Review of records and extensive additional review of physical, cognitive, psychosocial history related to current functional performance   ? Occupational performance deficits (Please refer to evaluation for details): ADL's;IADL's;Rest and Sleep;Work;Leisure;Social Participation   ? Body Structure / Function / Physical Skills Decreased knowledge of use of DME;Decreased knowledge of precautions;Skin integrity;Edema;ADL;ROM;Mobility;Pain    ? Rehab Potential Good   ? Clinical Decision Making Multiple treatment options, significant modification of task necessary   ? Comorbidities Affecting Occupational Performance: Presence of comorbidities impacting occupational performance   ? Modification or Assistance to Complete Evaluation  Max significant modification of tasks or assist is necessary to complete   ? OT Frequency 2x / week   and PRN for follow-along  ? OT Duration 12 weeks   and PRN for caregiver  and f/along support  ? OT Treatment/Interventions Self-care/ADL training;DME and/or AE instruction;Manual lymph drainage;Therapeutic activities;Therapeutic exercise;Functional Mobility Training;Patient/family education;Manual Therapy   ? Plan Complete Decongestive therapy (CDT) to one limb at a time to limit falls risk and maximize function Wrap to below the knees to limit falls risk. : MLD, skin care to limit infection risk, lymphatic pumping ther ex, compression wrapping then appropriate compression garments/ device. Pt will require daily assistance from caregiver to participate in clinical program and home program due to limited mobility, cognition and functional ADLs performance.   ? Consulted and Agree with Plan of Care Patient;Family member/caregiver   ? Family Member Consulted Granddaughter, Olin Hauser   ? ?  ?  ? ?  ? ? ?Patient will benefit from skilled therapeutic intervention in order to improve the following deficits and impairments:   ?Body Structure / Function / Physical Skills: Decreased knowledge of use of DME, Decreased knowledge of precautions, Skin integrity, Edema, ADL, ROM, Mobility, Pain ?  ?  ? ? ?Visit Diagnosis: ?Lymphedema, not elsewhere classified ? ? ? ?Problem List ?Patient Active Problem List  ? Diagnosis Date Noted  ? AKI (acute kidney injury) ([place])   ? Acute on chronic diastolic CHF (congestive heart failure) ([place]) 10/24/2021  ? Wound of right leg, initial encounter 10/24/2021  ? SOB (shortness of breath)   ? Acute exacerbation  of CHF (congestive heart failure) ([place]) 07/01/2021  ? CKD (chronic kidney disease), stage III ([place]) 07/01/2021  ? Essential hypertension 07/01/2021  ? Lymphedema 07/01/2021  ? Obstructive sleep apnea 09/11/2

## 2022-02-04 ENCOUNTER — Ambulatory Visit: Payer: Medicare PPO | Admitting: Occupational Therapy

## 2022-02-04 DIAGNOSIS — I890 Lymphedema, not elsewhere classified: Secondary | ICD-10-CM | POA: Diagnosis not present

## 2022-02-04 NOTE — Therapy (Signed)
[place] [facility] ?[place], [place], [address] ?Phone: [phone]   Fax:  [phone] ? ?Occupational Therapy Treatment ? ?Patient Details  ?Name: [name] [name] ?MRN: [id number] ?Date of Birth: 1925-05-29 ?Referring Provider (OT): [name], [place] ? ? ?Encounter Date: 02/04/2022 ? ? OT End of Session - 02/04/22 1515   ? ? Visit Number 4   ? Number of Visits 36   ? Date for OT Re-Evaluation 04/15/22   ? OT Start Time 0300   ? Activity Tolerance Patient tolerated treatment well;No increased pain   ? Behavior During Therapy [facility] for tasks assessed/performed   ? ?  ?  ? ?  ? ? ?Past Medical History:  ?Diagnosis Date  ? Anemia   ? CHF (congestive heart failure) ([place])   ? Chronic kidney disease   ? Dementia ([place])   ? Diabetes mellitus without complication ([place])   ? GIB (gastrointestinal bleeding)   ? Hyperlipidemia   ? Hypertension   ? Lymphedema   ? OSA on CPAP   ? Osteoarthritis   ? Vitamin D deficiency   ? ? ?Past Surgical History:  ?Procedure Laterality Date  ? ABDOMINAL HYSTERECTOMY    ? ? ?There were no vitals filed for this visit. ? ? Subjective Assessment - 02/04/22 1516   ? ? Subjective  [name] [name] presents for OT Rx session 4/36 to address BLE lymphedema. Pt arrives in a transport wc. Pt reports bandages did not cause increased pain in her legs over the weekend. Her granddaughter reports she was able to apply wraps without too much difficulty.  [name] [name] endorses LE-related leg pain described as heaviness, but she is unable to rate it numerically.   ? Patient is accompanied by: Family member   ? 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 Limitations difficulty walking, impaired transfers/ functional mobility, limited AROM, impaired sensation, decreased BLE AROM , generalized weakness, impaired  basic and instrumental ADLs, limited functional performance of productivfe and leisure activities, limit social participation   ? Repetition Increases Symptoms   ? Special Tests +Stemmer, Intake FOTO TBA   ? Currently in Pain? Yes   ? Pain Onset Other (comment)   4 yrs ago w onset of LE  ? ?  ?  ? ?  ? ? ? ? ? ? ? ? ? ? ? ? ? ? ? OT Treatments/Exercises (OP) - 02/04/22 1557   ? ?  ? ADLs  ? ADL Education Given Yes   ?  ? Manual Therapy  ? Manual Therapy Edema management;Manual Lymphatic Drainage (MLD);Compression Bandaging   ? Manual Lymphatic Drainage (MLD) Cokmmenced LLE/LLQ MLD utilizing short neck sequence, diaphragmnatic breathing, functional inguinal LN, and proximal to distal J strokes   from thigh to base of toes, then back to terminus x 3. Good tolerance.   ? Compression Bandaging No  compression wraps to LLE after manual therapy. Pt did not bring bandages to clinic today.   ? ?  ?  ? ?  ? ? ? ? ? ? ? ? ? OT Education - 02/04/22 1600   ? ? Education Details Pt and family edu re map of lymphatics in relation to MLD and manual therapy for fibrosis reduction. Taught diaphragmatic breathing to activate thoracic duct during MLD.   ? Person(s) Educated  Patient;Other (comment)   granddaughter, [name] [name]  ? Methods Explanation;Demonstration;Tactile cues;Verbal cues;Handout   ? Comprehension Verbalized understanding;Returned demonstration;Need further instruction   ? ?  ?  ? ?  ? ? ? ? ? ? OT Long Term Goals - 01/17/22 1248   ? ?  ? OT LONG TERM GOAL #1  ? Title With Max caregiver assistance Pt will demonstrate understanding of lymphedema prevention strategies by identifying and discussing 5 precautions using printed reference (modified assistance) to reduce risk of progression and to limit infection risk.   ? Baseline dependent   ? Time 4   ? Period Days   ? Status New   ? Target Date --   4th OT Rx visit  ?  ? OT LONG TERM GOAL #2  ? Title With Maximum caregiver assistance Pt will be able to apply multilayer, knee  length, compression wraps using gradient techniques to decrease limb volume, to limit infection risk, and to limit lymphedema progression.   ? Baseline dependent   ? Time 4   ? Period Days   ? Status New   ? Target Date --   4th OT visit  ?  ? OT LONG TERM GOAL #3  ? Title Pt will achieve at least a 10% limb volume reduction below the knee bilaterally to return limb to more normal size and shape, to limit infection risk, to decrease pain, to improve function, and to limit lymphedema progression.   ? Baseline dependent   ? Time 12   ? Period Weeks   ? Status New   ? Target Date 04/15/22   ?  ? OT LONG TERM GOAL #4  ? Title With Max caregiver assistance Pt will achieve and sustain a least 85% compliance with all 4 LE self-care home program components throughout Intensive Phase CDT, including modified simple self-MLD, daily skin inspection and care, lymphatic pumping the ex, 23/7 compression wraps to optimize limb volume reductions, to limit lymphedema progression and to limit further functional decline.   ? Baseline dependent   ? Time 12   ? Period Weeks   ? Status New   ? Target Date 04/15/22   ?  ? OT LONG TERM GOAL #5  ? Title Given this patient?s low Intake score of TBA on the functional outcomes FOTO tool, patient will experience an increase in function of 3 points to improve basic and instrumental ADLs performance, including lymphedema self-care.   ? Baseline TBA   ? Time 12   ? Period Weeks   ? Status New   ? Target Date 04/17/22   ? ?  ?  ? ?  ? ? ? ? ? ? ? ? Plan - 02/04/22 1605   ? ? Clinical Impression Statement LLE appears well reduced   after last visit. Skin is in very good cndition. Commenced MLD today to LLE/LLQ utilizing functional inguinal LN for regional return, then diaphragmatic breaching and terminus massage . Pt tolerated very well without increased pain. No compression wraps applied after manual therapy as Pt did not bring supplies to clinic today. Her granddaughter agrees to assist Pt with  compression wraps to LLE once returned home. Cont 2 x weekly as per POC.   ? OT Occupational Profile and History Comprehensive Assessment- Review of records and extensive additional review of physical, cognitive, psychosocial history related to current functional performance   ? Occupational performance deficits (Please refer to evaluation for details): ADL's;IADL's;Rest and Sleep;Work;Leisure;Social Participation   ? Body Structure / Function /  Physical Skills Decreased knowledge of use of DME;Decreased knowledge of precautions;Skin integrity;Edema;ADL;ROM;Mobility;Pain   ? Rehab Potential Good   ? Clinical Decision Making Multiple treatment options, significant modification of task necessary   ? Comorbidities Affecting Occupational Performance: Presence of comorbidities impacting occupational performance   ? Modification or Assistance to Complete Evaluation  Max significant modification of tasks or assist is necessary to complete   ? OT Frequency 2x / week   and PRN for follow-along  ? OT Duration 12 weeks   and PRN for caregiver and f/along support  ? OT Treatment/Interventions Self-care/ADL training;DME and/or AE instruction;Manual lymph drainage;Therapeutic activities;Therapeutic exercise;Functional Mobility Training;Patient/family education;Manual Therapy   ? Plan Complete Decongestive therapy (CDT) to one limb at a time to limit falls risk and maximize function Wrap to below the knees to limit falls risk. : MLD, skin care to limit infection risk, lymphatic pumping ther ex, compression wrapping then appropriate compression garments/ device. Pt will require daily assistance from caregiver to participate in clinical program and home program due to limited mobility, cognition and functional ADLs performance.   ? Consulted and Agree with Plan of Care Patient;Family member/caregiver   ? Family Member Consulted Granddaughter, [name] [name]   ? ?  ?  ? ?  ? ? ?Patient will benefit from skilled therapeutic intervention in  order to improve the following deficits and impairments:   ?Body Structure / Function / Physical Skills: Decreased knowledge of use of DME, Decreased knowledge of precautions, Skin integrity, Edema, ADL, ROM,

## 2022-02-04 NOTE — Patient Instructions (Signed)

## 2022-02-07 ENCOUNTER — Ambulatory Visit: Payer: Medicare PPO | Admitting: Occupational Therapy

## 2022-02-07 DIAGNOSIS — I890 Lymphedema, not elsewhere classified: Secondary | ICD-10-CM | POA: Diagnosis not present

## 2022-02-07 NOTE — Therapy (Signed)
[facility] ?[place], [place], [address] ?Phone: [phone]   Fax:  [phone] ? ?Occupational Therapy Treatment ? ?Patient Details  ?Name: [name] [name] ?MRN: [id number] ?Date of Birth: [birth year] ?Referring Provider (OT): [name], [place] ? ? ?Encounter Date: 02/07/2022 ? ? OT End of Session - 02/07/22 1503   ? ? Visit Number 5   ? Number of Visits 36   ? Date for OT Re-Evaluation 04/15/22   ? OT Start Time [phone]   ? OT Stop Time 0410   ? OT Time Calculation (min) 65 min   ? Activity Tolerance Patient tolerated treatment well;No increased pain   ? Behavior During Therapy [facility] for tasks assessed/performed   ? ?  ?  ? ?  ? ? ?Past Medical History:  ?Diagnosis Date  ? Anemia   ? CHF (congestive heart failure) ([place])   ? Chronic kidney disease   ? Dementia ([place])   ? Diabetes mellitus without complication ([place])   ? GIB (gastrointestinal bleeding)   ? Hyperlipidemia   ? Hypertension   ? Lymphedema   ? OSA on CPAP   ? Osteoarthritis   ? Vitamin D deficiency   ? ? ?Past Surgical History:  ?Procedure Laterality Date  ? ABDOMINAL HYSTERECTOMY    ? ? ?There were no vitals filed for this visit. ? ? Subjective Assessment - 02/07/22 1617   ? ? Subjective  [name] [name] presents for OT Rx session [id number] to address BLE lymphedema. Pt arrives in a transport wc. [name] [name] endorses LE-related leg pain described as heaviness, but she is unable to rate it numerically.   ? Patient is accompanied by: Family member   ? 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 Limitations difficulty walking, impaired transfers/ functional mobility, limited AROM, impaired sensation, decreased BLE AROM , generalized weakness, impaired basic and instrumental ADLs, limited functional performance of productivfe and leisure activities, limit  social participation   ? Repetition Increases Symptoms   ? Special Tests +Stemmer, Intake FOTO TBA   ? Pain Onset Other (comment)   4 yrs ago w onset of LE  ? ?  ?  ? ?  ? ? ? ? ? ? ? ? ? ? ? ? ? ? ? ? ? ? ? ? ? ? ? ? ? ? ? ? OT Long Term Goals - 01/17/22 1248   ? ?  ? OT LONG TERM GOAL #1  ? Title With Max caregiver assistance Pt will demonstrate understanding of lymphedema prevention strategies by identifying and discussing 5 precautions using printed reference (modified assistance) to reduce risk of progression and to limit infection risk.   ? Baseline dependent   ? Time 4   ? Period Days   ? Status New   ? Target Date --   4th OT Rx visit  ?  ? OT LONG TERM GOAL #2  ? Title With Maximum caregiver assistance Pt will be able to apply multilayer, knee length, compression wraps using gradient techniques to decrease limb volume, to limit infection risk, and to limit lymphedema progression.   ? Baseline dependent   ? Time 4   ? Period Days   ? Status New   ? Target Date --   4th OT visit  ?  ? OT LONG TERM  GOAL #3  ? Title Pt will achieve at least a 10% limb volume reduction below the knee bilaterally to return limb to more normal size and shape, to limit infection risk, to decrease pain, to improve function, and to limit lymphedema progression.   ? Baseline dependent   ? Time 12   ? Period Weeks   ? Status New   ? Target Date 04/15/22   ?  ? OT LONG TERM GOAL #4  ? Title With Max caregiver assistance Pt will achieve and sustain a least 85% compliance with all 4 LE self-care home program components throughout Intensive Phase CDT, including modified simple self-MLD, daily skin inspection and care, lymphatic pumping the ex, 23/7 compression wraps to optimize limb volume reductions, to limit lymphedema progression and to limit further functional decline.   ? Baseline dependent   ? Time 12   ? Period Weeks   ? Status New   ? Target Date 04/15/22   ?  ? OT LONG TERM GOAL #5  ? Title Given this patient?s low Intake score of  TBA on the functional outcomes FOTO tool, patient will experience an increase in function of 3 points to improve basic and instrumental ADLs performance, including lymphedema self-care.   ? Baseline TBA   ? Time 12   ? Period Weeks   ? Status New   ? Target Date 04/17/22   ? ?  ?  ? ?  ? ? ? ? ? ? ? ? Plan - 02/07/22 1613   ? ? Clinical Impression Statement Family caregiver/ granddaughter states they would like to finish scheduled sessions then stop OT as burden of care for 2 x weekly  Intensive CDT requires a significant effort for her at present. Today we explored alternative wrap style compression , including CircAid Juxtafit and [employer]. These are very caregiver friendsly. [name] [name] decided on CircAid and we determined that Pt wears a SIZE LARGE, SHORT. Provided multiple online resources for [employer]. [name] [name] had practice applying compression wraps to LLE. OT modified 2nd shoe with velcro hook and loop extensions. Cont as per POC.   ? OT Occupational Profile and History Comprehensive Assessment- Review of records and extensive additional review of physical, cognitive, psychosocial history related to current functional performance   ? Occupational performance deficits (Please refer to evaluation for details): ADL's;IADL's;Rest and Sleep;Work;Leisure;Social Participation   ? Body Structure / Function / Physical Skills Decreased knowledge of use of DME;Decreased knowledge of precautions;Skin integrity;Edema;ADL;ROM;Mobility;Pain   ? Rehab Potential Good   ? Clinical Decision Making Multiple treatment options, significant modification of task necessary   ? Comorbidities Affecting Occupational Performance: Presence of comorbidities impacting occupational performance   ? Modification or Assistance to Complete Evaluation  Max significant modification of tasks or assist is necessary to complete   ? OT Frequency 2x / week   and PRN for follow-along  ? OT Duration 12 weeks   and PRN for caregiver and f/along support   ? OT Treatment/Interventions Self-care/ADL training;DME and/or AE instruction;Manual lymph drainage;Therapeutic activities;Therapeutic exercise;Functional Mobility Training;Patient/family education;Manual Therapy   ? Plan Complete Decongestive therapy (CDT) to one limb at a time to limit falls risk and maximize function Wrap to below the knees to limit falls risk. : MLD, skin care to limit infection risk, lymphatic pumping ther ex, compression wrapping then appropriate compression garments/ device. Pt will require daily assistance from caregiver to participate in clinical program and home program due to limited mobility, cognition and functional ADLs performance.   ?  Consulted and Agree with Plan of Care Patient;Family member/caregiver   ? Family Member Consulted Granddaughter, Olin Hauser   ? ?  ?  ? ?  ? ? ?Patient will benefit from skilled therapeutic intervention in order to improve the following deficits and impairments:   ?Body Structure / Function / Physical Skills: Decreased knowledge of use of DME, Decreased knowledge of precautions, Skin integrity, Edema, ADL, ROM, Mobility, Pain ?  ?  ? ? ?Visit Diagnosis: ?Lymphedema, not elsewhere classified ? ? ? ?Problem List ?Patient Active Problem List  ? Diagnosis Date Noted  ? AKI (acute kidney injury) ([place])   ? Acute on chronic diastolic CHF (congestive heart failure) ([place]) 10/24/2021  ? Wound of right leg, initial encounter 10/24/2021  ? SOB (shortness of breath)   ? Acute exacerbation of CHF (congestive heart failure) ([place]) 07/01/2021  ? CKD (chronic kidney disease), stage III ([place]) 07/01/2021  ? Essential hypertension 07/01/2021  ? Lymphedema 07/01/2021  ? Obstructive sleep apnea 07/01/2021  ? GERD (gastroesophageal reflux disease) 07/01/2021  ? Insomnia 07/01/2021  ? Primary osteoarthritis 07/01/2021  ? Angioedema 07/01/2021  ? Diverticulosis 06/02/2021  ? Impaired mobility 03/01/2021  ? Dementia without behavioral disturbance ([place]) 03/01/2021  ? History of GI  bleed 01/04/2021  ? Hypokalemia 07/08/2018  ? Dyslipidemia 07/08/2018  ? Anemia in chronic kidney disease 07/08/2018  ? Iron deficiency 09/28/2017  ? Unspecified diastolic (congestive) heart failure ([place]

## 2022-02-07 NOTE — Patient Instructions (Signed)

## 2022-02-12 ENCOUNTER — Ambulatory Visit: Payer: Medicare PPO | Admitting: Occupational Therapy

## 2022-02-13 ENCOUNTER — Ambulatory Visit
Admission: RE | Admit: 2022-02-13 | Discharge: 2022-02-13 | Disposition: A | Discharge status: Home / Self Care | Payer: Medicare PPO | Source: Ambulatory Visit | Attending: Nephrology | Admitting: Nephrology

## 2022-02-13 DIAGNOSIS — R609 Edema, unspecified: Secondary | ICD-10-CM | POA: Insufficient documentation

## 2022-02-13 DIAGNOSIS — N184 Chronic kidney disease, stage 4 (severe): Secondary | ICD-10-CM | POA: Diagnosis present

## 2022-02-13 MED ORDER — POTASSIUM CHLORIDE CRYS ER 20 MEQ PO TBCR
20.0000 meq | EXTENDED_RELEASE_TABLET | Freq: Once | ORAL | Status: AC
  Administered 2022-02-13: 20 meq via ORAL

## 2022-02-13 MED ORDER — FUROSEMIDE 10 MG/ML IJ SOLN
80.0000 mg | Freq: Once | INTRAMUSCULAR | Status: AC
Start: 2022-02-13 — End: 2022-02-13
  Administered 2022-02-13: 80 mg via INTRAVENOUS

## 2022-02-14 ENCOUNTER — Ambulatory Visit: Payer: Medicare PPO | Admitting: Occupational Therapy

## 2022-02-14 ENCOUNTER — Ambulatory Visit
Admission: RE | Admit: 2022-02-14 | Discharge: 2022-02-14 | Disposition: A | Discharge status: Home / Self Care | Payer: Medicare PPO | Source: Ambulatory Visit | Attending: Family | Admitting: Family

## 2022-02-14 DIAGNOSIS — N184 Chronic kidney disease, stage 4 (severe): Secondary | ICD-10-CM | POA: Diagnosis not present

## 2022-02-14 DIAGNOSIS — R609 Edema, unspecified: Secondary | ICD-10-CM | POA: Insufficient documentation

## 2022-02-14 MED ORDER — FUROSEMIDE 10 MG/ML IJ SOLN
INTRAMUSCULAR | Status: AC
  Administered 2022-02-14: 80 mg via INTRAVENOUS
  Filled 2022-02-14: qty 8

## 2022-02-14 MED ORDER — FUROSEMIDE 10 MG/ML IJ SOLN: 80.0000 mg | Freq: Once | INTRAMUSCULAR | Status: AC

## 2022-02-14 MED ORDER — POTASSIUM CHLORIDE CRYS ER 20 MEQ PO TBCR: 20.0000 meq | EXTENDED_RELEASE_TABLET | Freq: Once | ORAL | Status: AC

## 2022-02-14 MED ORDER — POTASSIUM CHLORIDE CRYS ER 20 MEQ PO TBCR
EXTENDED_RELEASE_TABLET | ORAL | Status: AC
  Administered 2022-02-14: 20 meq via ORAL
  Filled 2022-02-14: qty 1

## 2022-02-15 ENCOUNTER — Ambulatory Visit: Payer: Medicare PPO

## 2022-02-18 ENCOUNTER — Ambulatory Visit
Admission: RE | Admit: 2022-02-18 | Discharge: 2022-02-18 | Disposition: A | Discharge status: Home / Self Care | Payer: Medicare PPO | Source: Ambulatory Visit | Attending: Nephrology | Admitting: Nephrology

## 2022-02-18 DIAGNOSIS — R609 Edema, unspecified: Secondary | ICD-10-CM | POA: Diagnosis not present

## 2022-02-18 DIAGNOSIS — N184 Chronic kidney disease, stage 4 (severe): Secondary | ICD-10-CM | POA: Insufficient documentation

## 2022-02-18 MED ORDER — FUROSEMIDE 10 MG/ML IJ SOLN: 80.0000 mg | Freq: Once | INTRAMUSCULAR | Status: AC

## 2022-02-18 MED ORDER — FUROSEMIDE 10 MG/ML IJ SOLN
INTRAMUSCULAR | Status: AC
  Filled 2022-02-18: qty 4

## 2022-02-18 MED ORDER — POTASSIUM CHLORIDE CRYS ER 20 MEQ PO TBCR: 20.0000 meq | EXTENDED_RELEASE_TABLET | Freq: Once | ORAL | Status: AC

## 2022-02-18 MED ORDER — FUROSEMIDE 10 MG/ML IJ SOLN
INTRAMUSCULAR | Status: AC
  Administered 2022-02-18: 80 mg via INTRAVENOUS
  Filled 2022-02-18: qty 4

## 2022-02-18 MED ORDER — POTASSIUM CHLORIDE CRYS ER 20 MEQ PO TBCR
EXTENDED_RELEASE_TABLET | ORAL | Status: AC
  Administered 2022-02-18: 20 meq via ORAL
  Filled 2022-02-18: qty 1

## 2022-02-18 MED ORDER — SODIUM CHLORIDE FLUSH 0.9 % IV SOLN
INTRAVENOUS | Status: AC
  Filled 2022-02-18: qty 10

## 2022-02-18 NOTE — Discharge Instructions (Signed)
Follow up with Dr. Holley Raring.  Go to the ED if any increase to breathing difficulties, chest pain or increased fluid retention. ?

## 2022-02-19 ENCOUNTER — Ambulatory Visit: Payer: Medicare PPO | Attending: Physician Assistant | Admitting: Occupational Therapy

## 2022-02-19 DIAGNOSIS — I890 Lymphedema, not elsewhere classified: Secondary | ICD-10-CM | POA: Diagnosis present

## 2022-02-20 NOTE — Therapy (Signed)
[facility] [facility] ?[place], [place], [address] ?Phone: [phone]   Fax:  [phone] ? ?Occupational Therapy Treatment ? ?Patient Details  ?Name: [name] [name] ?MRN: [id number] ?Date of Birth: Jun 06, 1925 ?Referring Provider (OT): [name] [name], [place] ? ? ?Encounter Date: 02/19/2022 ? ? OT End of Session - 02/19/22 1412   ? ? Visit Number 6   ? Number of Visits 36   ? Date for OT Re-Evaluation 04/15/22   ? OT Start Time 0210   ? OT Stop Time 0305   ? OT Time Calculation (min) 55 min   ? Activity Tolerance Patient tolerated treatment well;No increased pain   ? Behavior During Therapy [name] [name] for tasks assessed/performed   ? ?  ?  ? ?  ? ? ?Past Medical History:  ?Diagnosis Date  ? Anemia   ? CHF (congestive heart failure) ([place])   ? Chronic kidney disease   ? Dementia ([place])   ? Diabetes mellitus without complication ([place])   ? GIB (gastrointestinal bleeding)   ? Hyperlipidemia   ? Hypertension   ? Lymphedema   ? OSA on CPAP   ? Osteoarthritis   ? Vitamin D deficiency   ? ? ?Past Surgical History:  ?Procedure Laterality Date  ? ABDOMINAL HYSTERECTOMY    ? ? ?There were no vitals filed for this visit. ? ? Subjective Assessment - 02/19/22 1416   ? ? Subjective  [name] [name] presents for OT Rx session 6/36 to address BLE lymphedema. Pt arrives in a transport wc. [name] [name] endorses LE-related leg pain described as heaviness, but she is unable to rate it numerically. Pt presents in manual wc and bilateral leg wraos  from toes to knees.   ? Patient is accompanied by: Family member   ? 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 Limitations difficulty walking, impaired transfers/ functional mobility, limited AROM, impaired sensation, decreased BLE AROM , generalized weakness, impaired basic and instrumental ADLs, limited  functional performance of productivfe and leisure activities, limit social participation   ? Repetition Increases Symptoms   ? Special Tests +Stemmer, Intake FOTO TBA   ? Pain Onset Other (comment)   4 yrs ago w onset of LE  ? ?  ?  ? ?  ? ? ? ? ? ? ? ? ? ? ? ? ? ? ? OT Treatments/Exercises (OP) - 02/20/22 6283   ? ?  ? ADLs  ? ADL Education Given Yes   ?  ? Manual Therapy  ? Manual Therapy Edema management;Manual Lymphatic Drainage (MLD);Compression Bandaging   ? Manual Lymphatic Drainage (MLD) Cokmmenced LLE/LLQ MLD utilizing short neck sequence, diaphragmnatic breathing, functional inguinal LN, and proximal to distal J strokes   from thigh to base of toes, then back to terminus x 3. Good tolerance.   ? Compression Bandaging No  compression wraps to LLE after manual therapy. Pt did not bring bandages to clinic today.   ? ?  ?  ? ?  ? ? ? ? ? ? ? ? ? OT Education - 02/20/22 0833   ? ? Education Details Continued Pt/ CG edu for lymphedema self care  and home program throughout session. Topics include multilayer, gradient compression wrapping, simple self-MLD, therapeutic lymphatic pumping exercises, skin/nail care, risk reduction factors and LE precautions, compression garments/recommendations and  wear and care schedule and compression garment donning / doffing using assistive devices. All questions answered to the Pt's satisfaction, and Pt demonstrates understanding by report.   ? Person(s) Educated Patient;Other (comment)   granddaughter, [name] [name]  ? Methods Explanation;Demonstration;Tactile cues;Verbal cues;Handout   ? Comprehension Verbalized understanding;Returned demonstration;Need further instruction   ? ?  ?  ? ?  ? ? ? ? ? ? OT Long Term Goals - 01/17/22 1248   ? ?  ? OT LONG TERM GOAL #1  ? Title With Max caregiver assistance Pt will demonstrate understanding of lymphedema prevention strategies by identifying and discussing 5 precautions using printed reference (modified assistance) to reduce risk of  progression and to limit infection risk.   ? Baseline dependent   ? Time 4   ? Period Days   ? Status New   ? Target Date --   4th OT Rx visit  ?  ? OT LONG TERM GOAL #2  ? Title With Maximum caregiver assistance Pt will be able to apply multilayer, knee length, compression wraps using gradient techniques to decrease limb volume, to limit infection risk, and to limit lymphedema progression.   ? Baseline dependent   ? Time 4   ? Period Days   ? Status New   ? Target Date --   4th OT visit  ?  ? OT LONG TERM GOAL #3  ? Title Pt will achieve at least a 10% limb volume reduction below the knee bilaterally to return limb to more normal size and shape, to limit infection risk, to decrease pain, to improve function, and to limit lymphedema progression.   ? Baseline dependent   ? Time 12   ? Period Weeks   ? Status New   ? Target Date 04/15/22   ?  ? OT LONG TERM GOAL #4  ? Title With Max caregiver assistance Pt will achieve and sustain a least 85% compliance with all 4 LE self-care home program components throughout Intensive Phase CDT, including modified simple self-MLD, daily skin inspection and care, lymphatic pumping the ex, 23/7 compression wraps to optimize limb volume reductions, to limit lymphedema progression and to limit further functional decline.   ? Baseline dependent   ? Time 12   ? Period Weeks   ? Status New   ? Target Date 04/15/22   ?  ? OT LONG TERM GOAL #5  ? Title Given this patient?s low Intake score of TBA on the functional outcomes FOTO tool, patient will experience an increase in function of 3 points to improve basic and instrumental ADLs performance, including lymphedema self-care.   ? Baseline TBA   ? Time 12   ? Period Weeks   ? Status New   ? Target Date 04/17/22   ? ?  ?  ? ?  ? ? ? ? ? ? ? ? Plan - 02/20/22 0826   ? ? Clinical Impression Statement [name] [name] arrives wearing  knee length multilayer wraps to both legs. OT reitterated to cg that this is contraindicated due to volume of  extracellular water this can return to and strain the heart, and due to concern for increased falls risk. CG agrees with plan to wrap one leg at a time going forward. Unable to fit CircAid Juxtafit compression leggings today as CG has not yet ordered them. Provided MLD to LLE and simultaneous skin care. Pt tolerated all manual therapy and reapplication of LLE compression wraps without difficulty. Cont as per POC.   ?  OT Occupational Profile and History Comprehensive Assessment- Review of records and extensive additional review of physical, cognitive, psychosocial history related to current functional performance   ? Occupational performance deficits (Please refer to evaluation for details): ADL's;IADL's;Rest and Sleep;Work;Leisure;Social Participation   ? Body Structure / Function / Physical Skills Decreased knowledge of use of DME;Decreased knowledge of precautions;Skin integrity;Edema;ADL;ROM;Mobility;Pain   ? Rehab Potential Good   ? Clinical Decision Making Multiple treatment options, significant modification of task necessary   ? Comorbidities Affecting Occupational Performance: Presence of comorbidities impacting occupational performance   ? Modification or Assistance to Complete Evaluation  Max significant modification of tasks or assist is necessary to complete   ? OT Frequency 2x / week   and PRN for follow-along  ? OT Duration 12 weeks   and PRN for caregiver and f/along support  ? OT Treatment/Interventions Self-care/ADL training;DME and/or AE instruction;Manual lymph drainage;Therapeutic activities;Therapeutic exercise;Functional Mobility Training;Patient/family education;Manual Therapy   ? Plan Complete Decongestive therapy (CDT) to one limb at a time to limit falls risk and maximize function Wrap to below the knees to limit falls risk. : MLD, skin care to limit infection risk, lymphatic pumping ther ex, compression wrapping then appropriate compression garments/ device. Pt will require daily assistance  from caregiver to participate in clinical program and home program due to limited mobility, cognition and functional ADLs performance.   ? Consulted and Agree with Plan of Care Patient;Family member/caregiver   ? Fam

## 2022-02-20 NOTE — Patient Instructions (Signed)

## 2022-02-21 ENCOUNTER — Ambulatory Visit: Payer: Medicare PPO | Admitting: Occupational Therapy

## 2022-02-26 ENCOUNTER — Ambulatory Visit: Payer: Medicare PPO | Admitting: Occupational Therapy

## 2022-02-26 DIAGNOSIS — I890 Lymphedema, not elsewhere classified: Secondary | ICD-10-CM | POA: Diagnosis not present

## 2022-02-26 NOTE — Therapy (Signed)
[facility] ?[place], [place], [address] ?Phone: [phone]   Fax:  [phone] ? ?Occupational Therapy Treatment ? ?Patient Details  ?Name: [name] [name] ?MRN: [id number] ?Date of Birth: 1925-03-03 ?Referring Provider (OT): [name], [place] ? ? ?Encounter Date: 02/26/2022 ? ? OT End of Session - 02/26/22 1423   ? ? Visit Number 7   ? Number of Visits 36   ? Date for OT Re-Evaluation 04/15/22   ? OT Start Time 0205   ? OT Stop Time 0305   ? OT Time Calculation (min) 60 min   ? Activity Tolerance Patient tolerated treatment well;No increased pain   ? Behavior During Therapy [facility] for tasks assessed/performed   ? ?  ?  ? ?  ? ? ?Past Medical History:  ?Diagnosis Date  ? Anemia   ? CHF (congestive heart failure) ([place])   ? Chronic kidney disease   ? Dementia ([place])   ? Diabetes mellitus without complication ([place])   ? GIB (gastrointestinal bleeding)   ? Hyperlipidemia   ? Hypertension   ? Lymphedema   ? OSA on CPAP   ? Osteoarthritis   ? Vitamin D deficiency   ? ? ?Past Surgical History:  ?Procedure Laterality Date  ? ABDOMINAL HYSTERECTOMY    ? ? ?There were no vitals filed for this visit. ? ? ? ? ? ? ? ? ? ? ? ? ? ? ? ? OT Treatments/Exercises (OP) - 02/26/22 1503   ? ?  ? ADLs  ? ADL Education Given Yes   ?  ? Manual Therapy  ? Manual Therapy Edema management;Manual Lymphatic Drainage (MLD);Compression Bandaging   ? Manual Lymphatic Drainage (MLD) Cokmmenced LLE/LLQ MLD utilizing short neck sequence, diaphragmnatic breathing, functional inguinal LN, and proximal to distal J strokes   from thigh to base of toes, then back to terminus x 3. Good tolerance.   ? Compression Bandaging Multilayer, short stretch compression wraps to RLE using gradient techniques- knee length   ? ?  ?  ? ?  ? ? ? ? ? ? ? ? ? OT Education - 02/26/22 1504   ? ? Education Details Continued Pt/ CG edu for lymphedema self care  and home program throughout session. Topics include  multilayer, gradient compression wrapping, simple self-MLD, therapeutic lymphatic pumping exercises, skin/nail care, risk reduction factors and LE precautions, compression garments/recommendations and wear and care schedule and compression garment donning / doffing using assistive devices. All questions answered to the Pt's satisfaction, and Pt demonstrates understanding by report.   ? Person(s) Educated Patient;Other (comment)   granddaughter, Olin Hauser  ? Methods Explanation;Demonstration;Tactile cues;Verbal cues;Handout   ? Comprehension Verbalized understanding;Returned demonstration;Need further instruction   ? ?  ?  ? ?  ? ? ? ? ? ? OT Long Term Goals - 01/17/22 1248   ? ?  ? OT LONG TERM GOAL #1  ? Title With Max caregiver assistance Pt will demonstrate understanding of lymphedema prevention strategies by identifying and discussing 5 precautions using printed reference (modified assistance) to reduce risk of progression and to limit infection risk.   ? Baseline dependent   ? Time 4   ? Period Days   ? Status New   ? Target Date --   4th OT Rx visit  ?  ? OT LONG TERM GOAL #2  ? Title With Maximum caregiver assistance Pt will be able to apply multilayer, knee length, compression wraps using gradient techniques to decrease  limb volume, to limit infection risk, and to limit lymphedema progression.   ? Baseline dependent   ? Time 4   ? Period Days   ? Status New   ? Target Date --   4th OT visit  ?  ? OT LONG TERM GOAL #3  ? Title Pt will achieve at least a 10% limb volume reduction below the knee bilaterally to return limb to more normal size and shape, to limit infection risk, to decrease pain, to improve function, and to limit lymphedema progression.   ? Baseline dependent   ? Time 12   ? Period Weeks   ? Status New   ? Target Date 04/15/22   ?  ? OT LONG TERM GOAL #4  ? Title With Max caregiver assistance Pt will achieve and sustain a least 85% compliance with all 4 LE self-care home program components  throughout Intensive Phase CDT, including modified simple self-MLD, daily skin inspection and care, lymphatic pumping the ex, 23/7 compression wraps to optimize limb volume reductions, to limit lymphedema progression and to limit further functional decline.   ? Baseline dependent   ? Time 12   ? Period Weeks   ? Status New   ? Target Date 04/15/22   ?  ? OT LONG TERM GOAL #5  ? Title Given this patient?s low Intake score of TBA on the functional outcomes FOTO tool, patient will experience an increase in function of 3 points to improve basic and instrumental ADLs performance, including lymphedema self-care.   ? Baseline TBA   ? Time 12   ? Period Weeks   ? Status New   ? Target Date 04/17/22   ? ?  ?  ? ?  ? ? ? ? ? ? ? ? Plan - 02/26/22 1459   ? ? Clinical Impression Statement Pt presents with knee length compression wraps on RLE. Quarter size blister is observed at lateral aspect of old wound scar. After MLD educated Pt and caregiver to infection prevvention and importance of layering (without tape) a non-stick, thin pad, aka Telfa, over blister and under stockinett to prevent pulling skin off of wound if blister breaks. Pt tolerated MLD to [place] working around but not over fragile skin. Reapplied knee length compression wraps as established. Reduce treatment frequency from 2 x to 1 x weekly by caregiver request.   ? OT Occupational Profile and History Comprehensive Assessment- Review of records and extensive additional review of physical, cognitive, psychosocial history related to current functional performance   ? Occupational performance deficits (Please refer to evaluation for details): ADL's;IADL's;Rest and Sleep;Work;Leisure;Social Participation   ? Body Structure / Function / Physical Skills Decreased knowledge of use of DME;Decreased knowledge of precautions;Skin integrity;Edema;ADL;ROM;Mobility;Pain   ? Rehab Potential Good   ? Clinical Decision Making Multiple treatment options, significant modification of  task necessary   ? Comorbidities Affecting Occupational Performance: Presence of comorbidities impacting occupational performance   ? Modification or Assistance to Complete Evaluation  Max significant modification of tasks or assist is necessary to complete   ? OT Frequency 2x / week   and PRN for follow-along  ? OT Duration 12 weeks   and PRN for caregiver and f/along support  ? OT Treatment/Interventions Self-care/ADL training;DME and/or AE instruction;Manual lymph drainage;Therapeutic activities;Therapeutic exercise;Functional Mobility Training;Patient/family education;Manual Therapy   ? Plan Complete Decongestive therapy (CDT) to one limb at a time to limit falls risk and maximize function Wrap to below the knees to limit falls risk. :  MLD, skin care to limit infection risk, lymphatic pumping ther ex, compression wrapping then appropriate compression garments/ device. Pt will require daily assistance from caregiver to participate in clinical program and home program due to limited mobility, cognition and functional ADLs performance.   ? Consulted and Agree with Plan of Care Patient;Family member/caregiver   ? Family Member Consulted Granddaughter, Olin Hauser   ? ?  ?  ? ?  ? ? ?Patient will benefit from skilled therapeutic intervention in order to improve the following deficits and impairments:   ?Body Structure / Function / Physical Skills: Decreased knowledge of use of DME, Decreased knowledge of precautions, Skin integrity, Edema, ADL, ROM, Mobility, Pain ?  ?  ? ? ?Visit Diagnosis: ?Lymphedema, not elsewhere classified ? ? ? ?Problem List ?Patient Active Problem List  ? Diagnosis Date Noted  ? AKI (acute kidney injury) ([place])   ? Acute on chronic diastolic CHF (congestive heart failure) ([place]) 10/24/2021  ? Wound of right leg, initial encounter 10/24/2021  ? SOB (shortness of breath)   ? Acute exacerbation of CHF (congestive heart failure) ([place]) 07/01/2021  ? CKD (chronic kidney disease), stage III ([place])  07/01/2021  ? Essential hypertension 07/01/2021  ? Lymphedema 07/01/2021  ? Obstructive sleep apnea 07/01/2021  ? GERD (gastroesophageal reflux disease) 07/01/2021  ? Insomnia 07/01/2021  ? Primary osteoarthritis 07/01/2021

## 2022-02-26 NOTE — Patient Instructions (Signed)

## 2022-02-27 ENCOUNTER — Other Ambulatory Visit: Payer: Medicare PPO | Admitting: Primary Care

## 2022-02-27 DIAGNOSIS — I5032 Chronic diastolic (congestive) heart failure: Secondary | ICD-10-CM

## 2022-02-27 DIAGNOSIS — F0390 Unspecified dementia without behavioral disturbance: Secondary | ICD-10-CM

## 2022-02-27 DIAGNOSIS — I890 Lymphedema, not elsewhere classified: Secondary | ICD-10-CM

## 2022-02-27 DIAGNOSIS — Z515 Encounter for palliative care: Secondary | ICD-10-CM

## 2022-02-27 DIAGNOSIS — N184 Chronic kidney disease, stage 4 (severe): Secondary | ICD-10-CM

## 2022-02-27 NOTE — Progress Notes (Signed)
? ? ?Man[employer] ?Community Palliative Care Consult Note ?Telephone: [phone]  ?Fax: [phone]  ? ? ?Date of encounter: 02/27/22 ?3:04 PM ?PATIENT NAME: [name] [name] ?[place] [address]   ?[phone] (home)  ?DOB: 1925/08/08 ?MRN: [id number] ?PRIMARY CARE PROVIDER:    ?[name], [name], MD,  ?[address] ?[place] [address] ?[phone] ? ?REFERRING PROVIDER:   ?[name], [name], MD ?[address] ?[address] ?[place],  [place] [address] ?[phone] ? ?RESPONSIBLE PARTY:    ?Contact Information   ? ? Name Relation Home Work Mobile  ? Rich,Pamela Daughter [phone]  [phone]  ? ?  ? ? ? ?I met face to face with patient and family in  home. Palliative Care was asked to follow this patient by consultation request of  [name], [name], MD to address advance care planning and complex medical decision making. This is a follow up visit. ? ?                                 ASSESSMENT AND PLAN / RECOMMENDATIONS:  ? ?Advance Care Planning/Goals of Care: Goals include to maximize quality of life and symptom management. Patient/health care surrogate gave his/her permission to discuss.Our advance care planning conversation included a discussion about:    ?The value and importance of advance care planning  ?Experiences with loved ones who have been seriously ill or have died  ?Exploration of personal, cultural or spiritual beliefs that might influence medical decisions  ?Exploration of goals of care in the event of a sudden injury or illness  ?Identification of a healthcare agent - granddaughter ?CODE STATUS: DNR ?Granddaughter spoke about difficulty in making eol decisions. ?She would like to speak to hospice chaplain if available. ? ?Symptom Management/Plan: ? ?Mobility: continues to be limited due to edema. Has been to lymphedema clinic and has pumps. They want to stop going to the clinic due to effort. Granddaughter can use pumps daily as  tolerated. She is less able to ambulate however due to weight of feet.Uses walker. ? ?Falls: had a fall in the shower, and now is reticent to get back in ? ?Nutrition: endorses some anorexia, having some problems with low na and appealing foods. Discussed  possible choices ? ?CHF: discussed disease process and fluid issues. Has 4+ lymphedema and reports cough at hs if she gets into bed, due to fluid overload.  Usually sleeps in chair with LE dependent. I did suggest sodium restriction and limiting fluid volume to see if this impacts her edema ? ?Caregiver strain: Health and safety inspector voices she'd like to speak with chaplain. We discussed her deciding for hospice services as she outlines decline in pt function and ability to go out for appointments. ? ?Follow up Palliative Care Visit: Palliative care will continue to follow for complex medical decision making, advance care planning, and clarification of goals. Return 4 weeks or prn. ? ?I spent 60 minutes providing this consultation. More than 50% of the time in this consultation was spent in counseling and care coordination. ? ?PPS: 40% ? ?HOSPICE ELIGIBILITY/DIAGNOSIS: TBD ? ?Chief Complaint: debility, renal failure Stage 4/5, CHF, edema ? ?HISTORY OF PRESENT ILLNESS:  [name] [name] is a 88 y.o. year old female  with debility, renal failure Stage 4/5, CHF, edema . Patient seen today to review palliative care needs to include medical decision making and advance care planning as appropriate.  ? ?History obtained from review of EMR, discussion with  primary team, and interview with family, facility staff/caregiver and/or [name] [name].  ?I reviewed available labs, medications, imaging, studies and related documents from the EMR.  Records reviewed and summarized above.  ? ?ROS ? ?General: NAD ?EYES: denies vision changes, has glasses  ?ENMT: denies dysphagia ?Cardiovascular: denies chest pain, endorses DOE ?Pulmonary: endorses cough, denies increased SOB ?Abdomen: endorses good  appetite, denies constipation, endorses incontinence of bowel ?GU: denies dysuria, endorses incontinence of urine ?MSK:  endorses  increased weakness,  no falls reported ?Skin: denies rashes or wounds, endorses R hand bruise from IV ?Neurological: denies pain, denies insomnia ?Psych: Endorses positive mood ? ?Physical Exam: ?Current and past weights: 179 lbs ?Constitutional: NAD ?General: frail appearing ?EYES: anicteric sclera, lids intact, no discharge  ?ENMT: intact hearing, oral mucous membranes moist, dentition intact ?CV: S1S2, RRR, 4+ LE lymphedema ?Pulmonary: LCTA, no increased work of breathing, no cough, room air ?Abdomen: intake 50%,, abdomen non tender ?MSK: no sarcopenia, moves all extremities, ambulatory with help, walker ?Skin: warm and dry, no rashes or wounds on visible skin ?Neuro:  + generalized weakness,  + cognitive impairment, non-anxious affect ? ? ?Thank you for the opportunity to participate in the care of [name] [name].  The palliative care team will continue to follow. Please call our office at [phone] if we can be of additional assistance.  ? ?Jason Coop, NP DNP, AGPCNP-BC ? ?COVID-19 PATIENT SCREENING TOOL ?Asked and negative response unless otherwise noted:  ? ?Have you had symptoms of covid, tested positive or been in contact with someone with symptoms/positive test in the past 5-10 days?  ? ?

## 2022-02-28 ENCOUNTER — Ambulatory Visit: Payer: Medicare PPO | Admitting: Occupational Therapy

## 2022-02-28 DIAGNOSIS — I890 Lymphedema, not elsewhere classified: Secondary | ICD-10-CM

## 2022-02-28 NOTE — Therapy (Signed)
[place] [facility] ?[place], [place], [address] ?Phone: [phone]   Fax:  [phone] ? ?Occupational Therapy Treatment ? ?Patient Details  ?Name: [name] [name] ?MRN: [id number] ?Date of Birth: 05/21/1925 ?Referring Provider (OT): [name], [place] ? ? ?Encounter Date: 02/28/2022 ? ? OT End of Session - 02/28/22 1417   ? ? Visit Number 8   ? Number of Visits 36   ? Date for OT Re-Evaluation 04/15/22   ? OT Start Time 0205   ? OT Stop Time 0311   ? OT Time Calculation (min) 66 min   ? Activity Tolerance Patient tolerated treatment well;No increased pain   ? Behavior During Therapy [employer] for tasks assessed/performed   ? ?  ?  ? ?  ? ? ?Past Medical History:  ?Diagnosis Date  ? Anemia   ? CHF (congestive heart failure) ([place])   ? Chronic kidney disease   ? Dementia ([place])   ? Diabetes mellitus without complication ([place])   ? GIB (gastrointestinal bleeding)   ? Hyperlipidemia   ? Hypertension   ? Lymphedema   ? OSA on CPAP   ? Osteoarthritis   ? Vitamin D deficiency   ? ? ?Past Surgical History:  ?Procedure Laterality Date  ? ABDOMINAL HYSTERECTOMY    ? ? ?There were no vitals filed for this visit. ? ? Subjective Assessment - 02/28/22 1419   ? ? Subjective  Ms. General presents for OT Rx session to address BLE lymphedema. Pt arrives in a transport wc. Ms. Tew LE-related leg pain described as heaviness. Pt presents in manual wc and bilateral leg wraps  from toes to knees.   ? Patient is accompanied by: Family member   ? 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 Limitations difficulty walking, impaired transfers/ functional mobility, limited AROM, impaired sensation, decreased BLE AROM , generalized weakness, impaired basic and instrumental ADLs, limited functional performance of productivfe and leisure  activities, limit social participation   ? Repetition Increases Symptoms   ? Special Tests +[place], Intake FOTO TBA   ? Currently in Pain? No/denies   ? Pain Onset Other (comment)   4 yrs ago w onset of LE  ? ?  ?  ? ?  ? ? ? ? ? ? ? ? ? ? ? ? ? ? ? OT Treatments/Exercises (OP) - 02/28/22 1423   ? ?  ? ADLs  ? ADL Education Given Yes   ?  ? Manual Therapy  ? Manual Therapy Edema management;Manual Lymphatic Drainage (MLD);Compression Bandaging   ? Manual Lymphatic Drainage (MLD) Provided LLE/LLQ MLD utilizing short neck sequence, diaphragmnatic breathing, functional inguinal LN, and proximal to distal J strokes   from thigh to base of toes, then back to terminus x 3. Good tolerance.   ? Compression Bandaging Multilayer, short stretch compression wraps to RLE using gradient techniques- knee length   ? ?  ?  ? ?  ? ? ? ? ? ? ? ? ? OT Education - 02/28/22 1519   ? ? Education Details Continued Pt/ CG edu for lymphedema self care  and home program throughout session. Topics include multilayer, gradient compression wrapping, simple self-MLD, therapeutic lymphatic pumping exercises, skin/nail care, risk reduction factors and LE precautions, compression garments/recommendations and wear and care schedule and compression garment donning /  doffing using assistive devices. All questions answered to the Pt's satisfaction, and Pt demonstrates understanding by report.   ? Person(s) Educated Patient;Other (comment)   granddaughter, Olin Hauser  ? Methods Explanation;Demonstration;Tactile cues;Verbal cues;Handout   ? Comprehension Verbalized understanding;Returned demonstration;Need further instruction   ? ?  ?  ? ?  ? ? ? ? ? ? OT Long Term Goals - 01/17/22 1248   ? ?  ? OT LONG TERM GOAL #1  ? Title With Max caregiver assistance Pt will demonstrate understanding of lymphedema prevention strategies by identifying and discussing 5 precautions using printed reference (modified assistance) to reduce risk of progression and to limit  infection risk.   ? Baseline dependent   ? Time 4   ? Period Days   ? Status New   ? Target Date --   4th OT Rx visit  ?  ? OT LONG TERM GOAL #2  ? Title With Maximum caregiver assistance Pt will be able to apply multilayer, knee length, compression wraps using gradient techniques to decrease limb volume, to limit infection risk, and to limit lymphedema progression.   ? Baseline dependent   ? Time 4   ? Period Days   ? Status New   ? Target Date --   4th OT visit  ?  ? OT LONG TERM GOAL #3  ? Title Pt will achieve at least a 10% limb volume reduction below the knee bilaterally to return limb to more normal size and shape, to limit infection risk, to decrease pain, to improve function, and to limit lymphedema progression.   ? Baseline dependent   ? Time 12   ? Period Weeks   ? Status New   ? Target Date 04/15/22   ?  ? OT LONG TERM GOAL #4  ? Title With Max caregiver assistance Pt will achieve and sustain a least 85% compliance with all 4 LE self-care home program components throughout Intensive Phase CDT, including modified simple self-MLD, daily skin inspection and care, lymphatic pumping the ex, 23/7 compression wraps to optimize limb volume reductions, to limit lymphedema progression and to limit further functional decline.   ? Baseline dependent   ? Time 12   ? Period Weeks   ? Status New   ? Target Date 04/15/22   ?  ? OT LONG TERM GOAL #5  ? Title Given this patient?s low Intake score of TBA on the functional outcomes FOTO tool, patient will experience an increase in function of 3 points to improve basic and instrumental ADLs performance, including lymphedema self-care.   ? Baseline TBA   ? Time 12   ? Period Weeks   ? Status New   ? Target Date 04/17/22   ? ?  ?  ? ?  ? ? ? ? ? ? ? ? Plan - 02/28/22 1511   ? ? Clinical Impression Statement Pt presents with knee length compression wraps on RLE. Blister on anterior L leg noted last visit is decreased in size and remains closed. Pt and CG instructed to treat  with antibacterial first aid cream and keep covered with non stick "Telfa" style pad under bandages (no tape on skin!) to limit swelling and facilitate wound healing. Limb volume is noticable decreased today when wraps are removed. Provided MLD to LLE as established being careful not to break blister. . Pt tolerated MLD withhout increased pain. Reviewed parts of compression wrapping steps while applying wraps per CG request. Reduce treatment frequency from 2 x to 1  x weekly by caregiver request starting next week. Cont as per POC.   ? OT Occupational Profile and History Comprehensive Assessment- Review of records and extensive additional review of physical, cognitive, psychosocial history related to current functional performance   ? Occupational performance deficits (Please refer to evaluation for details): ADL's;IADL's;Rest and Sleep;Work;Leisure;Social Participation   ? Body Structure / Function / Physical Skills Decreased knowledge of use of DME;Decreased knowledge of precautions;Skin integrity;Edema;ADL;ROM;Mobility;Pain   ? Rehab Potential Good   ? Clinical Decision Making Multiple treatment options, significant modification of task necessary   ? Comorbidities Affecting Occupational Performance: Presence of comorbidities impacting occupational performance   ? Modification or Assistance to Complete Evaluation  Max significant modification of tasks or assist is necessary to complete   ? OT Frequency 2x / week   and PRN for follow-along  ? OT Duration 12 weeks   and PRN for caregiver and f/along support  ? OT Treatment/Interventions Self-care/ADL training;DME and/or AE instruction;Manual lymph drainage;Therapeutic activities;Therapeutic exercise;Functional Mobility Training;Patient/family education;Manual Therapy   ? Plan Complete Decongestive therapy (CDT) to one limb at a time to limit falls risk and maximize function Wrap to below the knees to limit falls risk. : MLD, skin care to limit infection risk,  lymphatic pumping ther ex, compression wrapping then appropriate compression garments/ device. Pt will require daily assistance from caregiver to participate in clinical program and home program due to limited mobility,

## 2022-02-28 NOTE — Patient Instructions (Signed)

## 2022-03-05 ENCOUNTER — Encounter: Payer: Medicare PPO | Admitting: Occupational Therapy

## 2022-03-07 ENCOUNTER — Encounter: Payer: Medicare PPO | Admitting: Occupational Therapy

## 2022-03-12 ENCOUNTER — Ambulatory Visit: Payer: Medicare PPO | Admitting: Occupational Therapy

## 2022-03-12 ENCOUNTER — Ambulatory Visit
Admission: RE | Admit: 2022-03-12 | Discharge: 2022-03-12 | Disposition: A | Discharge status: Home / Self Care | Payer: Medicare PPO | Source: Ambulatory Visit | Attending: Nephrology | Admitting: Nephrology

## 2022-03-12 DIAGNOSIS — I890 Lymphedema, not elsewhere classified: Secondary | ICD-10-CM | POA: Diagnosis not present

## 2022-03-12 DIAGNOSIS — N184 Chronic kidney disease, stage 4 (severe): Secondary | ICD-10-CM | POA: Insufficient documentation

## 2022-03-12 DIAGNOSIS — R609 Edema, unspecified: Secondary | ICD-10-CM | POA: Insufficient documentation

## 2022-03-12 MED ORDER — FUROSEMIDE 10 MG/ML IJ SOLN
INTRAMUSCULAR | Status: AC
  Administered 2022-03-12: 80 mg
  Filled 2022-03-12: qty 8

## 2022-03-12 MED ORDER — POTASSIUM CHLORIDE CRYS ER 20 MEQ PO TBCR: 20.0000 meq | EXTENDED_RELEASE_TABLET | Freq: Once | ORAL | Status: AC

## 2022-03-12 MED ORDER — FUROSEMIDE 10 MG/ML IJ SOLN: 80.0000 mg | Freq: Once | INTRAMUSCULAR | Status: AC

## 2022-03-12 MED ORDER — POTASSIUM CHLORIDE CRYS ER 20 MEQ PO TBCR
EXTENDED_RELEASE_TABLET | ORAL | Status: AC
  Administered 2022-03-12: 20 meq
  Filled 2022-03-12: qty 1

## 2022-03-13 NOTE — Therapy (Signed)
[place] [facility] SERVICES [address] [place], [place], [address] Phone: [phone]   Fax:  [phone]  Occupational Therapy Treatment  Patient Details  Name: [name] [name] MRN: [id number] Date of Birth: 28-Aug-1925 Referring Provider (OT): [name], [place]   Encounter Date: 03/12/2022   OT End of Session - 03/12/22 0936     Visit Number 9    Number of Visits 36    Date for OT Re-Evaluation 04/15/22    OT Start Time 0200    OT Stop Time 0300    OT Time Calculation (min) 60 min    Activity Tolerance Patient tolerated treatment well;No increased pain;Patient limited by pain    Behavior During Therapy [facility] for tasks assessed/performed             Past Medical History:  Diagnosis Date   Anemia    CHF (congestive heart failure) (HCC)    Chronic kidney disease    Dementia ([place])    Diabetes mellitus without complication (HCC)    GIB (gastrointestinal bleeding)    Hyperlipidemia    Hypertension    Lymphedema    OSA on CPAP    Osteoarthritis    Vitamin D deficiency     Past Surgical History:  Procedure Laterality Date   ABDOMINAL HYSTERECTOMY      There were no vitals filed for this visit.   Subjective Assessment - 03/12/22 0944     Subjective  [name] [name] presents for OT Rx session to address BLE lymphedema. She was last seen on 02/28/22. She is accompanied by her granddaughter who puched transport cheir to clinic. Pt arrives with RLE knee length compression wraps in place.. [name] [name] LE-related leg pain described as heaviness.    Patient is accompanied by: Family member    Pertinent History Medical Hx relevant to LE: Stage III CKD, HTN, OSA, CHF, DM, hx non-pressure related chronic ulcer of R calf, Dementia, OA, chronic leg swelling w/ associated pain, impaired memory    Limitations difficulty walking, impaired transfers/ functional mobility, limited AROM, impaired sensation, decreased BLE AROM , generalized weakness, impaired  basic and instrumental ADLs, limited functional performance of productivfe and leisure activities, limit social participation    Repetition Increases Symptoms    Special Tests +[place]emmer    Currently in Pain? Yes    Pain Score --   not rated numerically   Pain Location Leg    Pain Orientation Right;Left    Pain Descriptors / Indicators Tightness;Heaviness;Tender    Pain Onset Other (comment)   4 yrs ago w onset of LE                         OT Treatments/Exercises (OP) - 03/13/22 9678       Manual Therapy   Manual Therapy Edema management;Manual Lymphatic Drainage (MLD);Compression Bandaging    Manual Lymphatic Drainage (MLD) Provided RLE/RLQ MLD utilizing short neck sequence, diaphragmnatic breathing, functional inguinal LN, and proximal to distal J strokes   from thigh to base of toes, then back to terminus x 3. Good tolerance.    Compression Bandaging Multilayer, short stretch compression wraps to RLE using gradient techniques- knee length                    OT Education - 03/13/22 0950     Education Details Continued Pt/ CG edu for lymphedema self care  and home program throughout session. Topics include multilayer, gradient compression  wrapping, simple self-MLD, therapeutic lymphatic pumping exercises, skin/nail care, risk reduction factors and LE precautions, compression garments/recommendations and wear and care schedule and compression garment donning / doffing using assistive devices. All questions answered to the Pt's satisfaction, and Pt demonstrates understanding by report.    Person(s) Educated Patient;Other (comment)   granddaughter, Olin Hauser   Methods Explanation;Demonstration;Tactile cues;Verbal cues;Handout    Comprehension Verbalized understanding;Returned demonstration;Need further instruction                 OT Long Term Goals - 03/12/22 0942       OT LONG TERM GOAL #1   Title With Max caregiver assistance Pt will demonstrate  understanding of lymphedema prevention strategies by identifying and discussing 5 precautions using printed reference (modified assistance) to reduce risk of progression and to limit infection risk.    Baseline dependent    Time 4    Period Days    Status Partially Met   CG met goal.   Target Date 03/25/22   4th OT Rx visit     OT LONG TERM GOAL #2   Title With Maximum caregiver assistance Pt will be able to apply multilayer, knee length, compression wraps using gradient techniques to decrease limb volume, to limit infection risk, and to limit lymphedema progression.    Baseline dependent    Time 4    Period Days    Status Achieved    Target Date --   4th OT visit     OT LONG TERM GOAL #3   Title Pt will achieve at least a 10% limb volume reduction below the knee bilaterally to return limb to more normal size and shape, to limit infection risk, to decrease pain, to improve function, and to limit lymphedema progression.    Baseline dependent    Time 12    Period Weeks    Status Partially Met    Target Date 04/15/22      OT LONG TERM GOAL #4   Title With Max caregiver assistance Pt will achieve and sustain a least 85% compliance with all 4 LE self-care home program components throughout Intensive Phase CDT, including modified simple self-MLD, daily skin inspection and care, lymphatic pumping the ex, 23/7 compression wraps to optimize limb volume reductions, to limit lymphedema progression and to limit further functional decline.    Baseline dependent    Time 12    Period Weeks    Status New    Target Date 04/15/22      OT LONG TERM GOAL #5   Title Given this patient's low Intake score of TBA on the functional outcomes FOTO tool, patient will experience an increase in function of 3 points to improve basic and instrumental ADLs performance, including lymphedema self-care.    Baseline TBA    Time 12    Period Weeks    Status New    Target Date 04/17/22                    Plan - 03/13/22 4709     Clinical Impression Statement Unable to fit recommended CircAid Juxtafit compression leggings today as CG has not yet ordered them. Pt continues to wrap between visits. RLE swelling appears well managed, but it is stubbornly persistent . Fatty fibrosis at distal legs, ankles and feet is intractible despite dilligent compression wrapping between OT visits. Continued MLD to RLE combined with simultaneous skin care to increase hydration below the knees. Skin is mildly dry today and flaking. Marland Kitchen  Pt tolerated all manual therapy and reapplication of LLE compression wraps without difficulty. Cont as per POC. 03/25/22 is Pt's last scheduled visit.    OT Occupational Profile and History Comprehensive Assessment- Review of records and extensive additional review of physical, cognitive, psychosocial history related to current functional performance    Occupational performance deficits (Please refer to evaluation for details): ADL's;IADL's;Rest and Sleep;Work;Leisure;Social Participation    Body Structure / Function / Physical Skills Decreased knowledge of use of DME;Decreased knowledge of precautions;Skin integrity;Edema;ADL;ROM;Mobility;Pain    Rehab Potential Good    Clinical Decision Making Multiple treatment options, significant modification of task necessary    Comorbidities Affecting Occupational Performance: Presence of comorbidities impacting occupational performance    Modification or Assistance to Complete Evaluation  Max significant modification of tasks or assist is necessary to complete    OT Frequency 2x / week   and PRN for follow-along   OT Duration 12 weeks   and PRN for caregiver and f/along support   OT Treatment/Interventions Self-care/ADL training;DME and/or AE instruction;Manual lymph drainage;Therapeutic activities;Therapeutic exercise;Functional Mobility Training;Patient/family education;Manual Therapy    Plan Complete Decongestive therapy (CDT) to one limb at a time to  limit falls risk and maximize function Wrap to below the knees to limit falls risk. : MLD, skin care to limit infection risk, lymphatic pumping ther ex, compression wrapping then appropriate compression garments/ device. Pt will require daily assistance from caregiver to participate in clinical program and home program due to limited mobility, cognition and functional ADLs performance.    Consulted and Agree with Plan of Care Patient;Family member/caregiver    Family Member Consulted Granddaughter, Olin Hauser             Patient will benefit from skilled therapeutic intervention in order to improve the following deficits and impairments:   Body Structure / Function / Physical Skills: Decreased knowledge of use of DME, Decreased knowledge of precautions, Skin integrity, Edema, ADL, ROM, Mobility, Pain       Visit Diagnosis: Lymphedema, not elsewhere classified    Problem List Patient Active Problem List   Diagnosis Date Noted   AKI (acute kidney injury) ([place])    Acute on chronic diastolic CHF (congestive heart failure) ([place]) 10/24/2021   Wound of right leg, initial encounter 10/24/2021   SOB (shortness of breath)    Acute exacerbation of CHF (congestive heart failure) ([place]) 07/01/2021   CKD (chronic kidney disease), stage III ([place]) 07/01/2021   Essential hypertension 07/01/2021   Lymphedema 07/01/2021   Obstructive sleep apnea 07/01/2021   GERD (gastroesophageal reflux disease) 07/01/2021   Insomnia 07/01/2021   Primary osteoarthritis 07/01/2021   Angioedema 07/01/2021   Diverticulosis 06/02/2021   Impaired mobility 03/01/2021   Dementia without behavioral disturbance ([place]) 03/01/2021   History of GI bleed 01/04/2021   Hypokalemia 07/08/2018   Dyslipidemia 07/08/2018   Anemia in chronic kidney disease 07/08/2018   Iron deficiency 09/28/2017   Unspecified diastolic (congestive) heart failure ([place]) 06/19/2017   Superior mesenteric artery stenosis ([place]) 12/20/2016   Lumbar  stenosis with neurogenic claudication 07/10/2015   Vitamin D deficiency 03/31/2015   Nonrheumatic mitral (valve) insufficiency 09/29/2014   Nonrheumatic aortic valve insufficiency 09/29/2014   Vitamin B12 deficiency 01/31/2014   Type 2 diabetes with nephropathy ([place]) 09/28/2013   Type 2 diabetes mellitus with diabetic chronic kidney disease ([place]) 09/28/2013   DDD (degenerative disc disease), lumbosacral 04/06/2012    Andrey Spearman, MS, OTR/L, CLT-LANA 03/13/22 9:51 AM    [place] [facility]  [place], [place], [address] Phone: [phone]   Fax:  [phone]  Name: Jamara Vary MRN: [id number] Date of Birth: Aug 31, 1925

## 2022-03-13 NOTE — Patient Instructions (Signed)

## 2022-03-14 ENCOUNTER — Ambulatory Visit
Admission: RE | Admit: 2022-03-14 | Discharge: 2022-03-14 | Disposition: A | Discharge status: Home / Self Care | Payer: Medicare PPO | Source: Ambulatory Visit | Attending: Family | Admitting: Family

## 2022-03-14 ENCOUNTER — Encounter: Payer: Medicare PPO | Admitting: Occupational Therapy

## 2022-03-14 DIAGNOSIS — R609 Edema, unspecified: Secondary | ICD-10-CM | POA: Insufficient documentation

## 2022-03-14 DIAGNOSIS — N184 Chronic kidney disease, stage 4 (severe): Secondary | ICD-10-CM | POA: Insufficient documentation

## 2022-03-14 MED ORDER — FUROSEMIDE 10 MG/ML IJ SOLN
INTRAMUSCULAR | Status: AC
  Filled 2022-03-14: qty 8

## 2022-03-14 MED ORDER — FUROSEMIDE 10 MG/ML IJ SOLN
80.0000 mg | Freq: Once | INTRAMUSCULAR | Status: AC
  Administered 2022-03-14: 80 mg via INTRAVENOUS

## 2022-03-14 MED ORDER — POTASSIUM CHLORIDE CRYS ER 20 MEQ PO TBCR
20.0000 meq | EXTENDED_RELEASE_TABLET | Freq: Once | ORAL | Status: AC
  Administered 2022-03-14: 20 meq via ORAL

## 2022-03-14 MED ORDER — POTASSIUM CHLORIDE CRYS ER 20 MEQ PO TBCR
EXTENDED_RELEASE_TABLET | ORAL | Status: AC
  Filled 2022-03-14: qty 1

## 2022-03-15 ENCOUNTER — Encounter (INDEPENDENT_AMBULATORY_CARE_PROVIDER_SITE_OTHER): Payer: Medicare PPO | Admitting: Vascular Surgery

## 2022-03-15 ENCOUNTER — Ambulatory Visit
Admission: RE | Admit: 2022-03-15 | Discharge: 2022-03-15 | Disposition: A | Discharge status: Home / Self Care | Payer: Medicare PPO | Source: Ambulatory Visit | Attending: Nephrology | Admitting: Nephrology

## 2022-03-15 DIAGNOSIS — N184 Chronic kidney disease, stage 4 (severe): Secondary | ICD-10-CM | POA: Diagnosis not present

## 2022-03-15 MED ORDER — FUROSEMIDE 10 MG/ML IJ SOLN
INTRAMUSCULAR | Status: AC
  Administered 2022-03-15: 80 mg via INTRAVENOUS
  Filled 2022-03-15: qty 8

## 2022-03-15 MED ORDER — FUROSEMIDE 10 MG/ML IJ SOLN: 80.0000 mg | Freq: Once | INTRAMUSCULAR | Status: AC

## 2022-03-15 MED ORDER — POTASSIUM CHLORIDE CRYS ER 20 MEQ PO TBCR
EXTENDED_RELEASE_TABLET | ORAL | Status: AC
  Administered 2022-03-15: 20 meq via ORAL
  Filled 2022-03-15: qty 1

## 2022-03-15 MED ORDER — POTASSIUM CHLORIDE CRYS ER 20 MEQ PO TBCR: 20.0000 meq | EXTENDED_RELEASE_TABLET | Freq: Once | ORAL | Status: AC

## 2022-03-19 ENCOUNTER — Encounter: Payer: Medicare PPO | Admitting: Occupational Therapy

## 2022-03-21 ENCOUNTER — Encounter: Payer: Medicare PPO | Admitting: Occupational Therapy

## 2022-03-25 ENCOUNTER — Ambulatory Visit: Payer: Medicare PPO | Attending: Physician Assistant | Admitting: Occupational Therapy

## 2022-03-25 ENCOUNTER — Encounter: Payer: Medicare PPO | Admitting: Occupational Therapy

## 2022-03-25 DIAGNOSIS — I890 Lymphedema, not elsewhere classified: Secondary | ICD-10-CM | POA: Insufficient documentation

## 2022-03-25 NOTE — Patient Instructions (Signed)

## 2022-03-27 ENCOUNTER — Encounter: Payer: Medicare PPO | Admitting: Occupational Therapy

## 2022-03-27 ENCOUNTER — Other Ambulatory Visit: Payer: Medicare PPO | Admitting: Primary Care

## 2022-03-28 ENCOUNTER — Other Ambulatory Visit: Payer: Medicare PPO | Admitting: Primary Care

## 2022-03-28 DIAGNOSIS — N184 Chronic kidney disease, stage 4 (severe): Secondary | ICD-10-CM

## 2022-03-28 DIAGNOSIS — R5381 Other malaise: Secondary | ICD-10-CM

## 2022-03-28 DIAGNOSIS — I5032 Chronic diastolic (congestive) heart failure: Secondary | ICD-10-CM

## 2022-03-28 DIAGNOSIS — Z7409 Other reduced mobility: Secondary | ICD-10-CM

## 2022-03-28 DIAGNOSIS — R0602 Shortness of breath: Secondary | ICD-10-CM

## 2022-03-28 DIAGNOSIS — Z515 Encounter for palliative care: Secondary | ICD-10-CM

## 2022-03-28 NOTE — Progress Notes (Signed)
[facility] Palliative Care Consult Note Telephone: [phone]  Fax: [phone]    Date of encounter: 03/28/22 11:17 AM PATIENT NAME: [name] [name] [address] [place] [address]   [phone] (home)  DOB: [birth year] MRN: [id number] PRIMARY CARE PROVIDER:    Melba Coon, [name],  [address]  [place] [address] [phone]  REFERRING PROVIDER:   Melba Coon, [name] [address] [place],  [place] [address] [phone]  RESPONSIBLE PARTY:    Contact Information     Name Relation Home Work Mobile   [name],[name] Daughter [phone]  [phone]        I met face to face with patient and family in  home. Palliative Care was asked to follow this patient by consultation request of  [name] [name] to address advance care planning and complex medical decision making. This is a follow up visit.                                   ASSESSMENT AND PLAN / RECOMMENDATIONS:   Advance Care Planning/Goals of Care: Goals include to maximize quality of life and symptom management. Patient/health care surrogate gave his/her permission to discuss.Our advance care planning conversation included a discussion about:    The value and importance of advance care planning  Experiences with loved ones who have been seriously ill or have died  Exploration of personal, cultural or spiritual beliefs that might influence medical decisions  Exploration of goals of care in the event of a sudden injury or illness  Identification of a healthcare agent - granddaughter Review and creation of an  advance directive document- MOST completed, DNR on file Decision  to de-escalate disease focused treatments due to poor prognosis. Discussed hospice services, payment source, when to call, what qualifies for hospice. POA would like to pursue for home management and supportive care. CODE STATUS: DNR [phone] [name] Fax  I  completed a MOST form today. The patient and family outlined their wishes for the following treatment decisions:  Cardiopulmonary Resuscitation: Do Not Attempt Resuscitation (DNR/No CPR)  Medical Interventions: Comfort Measures: Keep clean, warm, and dry. Use medication by any route, positioning, wound care, and other measures to relieve pain and suffering. Use oxygen, suction and manual treatment of airway obstruction as needed for comfort. Do not transfer to the hospital unless comfort needs cannot be met in current location.  Antibiotics: Determine use of limitation of antibiotics when infection occurs  IV Fluids: No IV fluids (provide other measures to ensure comfort)  Feeding Tube: No feeding tube    I spent 30 minutes providing this consultation. More than 50% of the time in this consultation was spent in counseling and care coordination.  ----------------------------------------------------------------------------------------------------------------------------------------------------------------------------------------------  Symptom Management/Plan:   Edema/CHF: continues to have intractable edema, on PO diuretics. We discussed her going out to lasix infusion but these are less and less effective and take a taxing effort for her to go out. She recently went in the car to the store but could not go in due to [name] [name] and fatigue, and had a hard time returning to the home. GFR is 17 on recent labs. We discussed disease processes for cardiac and renal systems. Family has chosen to maximize conservative measures for comfort management eg oral diuretics eg torsemide or zaroxolyn.   She continues to have 4+ pitting and lymphedema. Family uses pumps but not much reduction. She has  compression and elevates. We discussed fluid restriction and sodium but also QoL in the face of end stage disease. Discussed sx management for discomfort with granddaughter. Didn't like gabapentin which may exacerbate the  edema. I suggested Sarna cream for topical relief of itching.  Immobility: Disease progression is reducing her ability to get around . She had a hard time rising with lift chair and walker just to stand. She is sleeping in her recliner now and uses bsc for elimination as she can no longer walk, only stand to transfer.  Nutrition: Intake is good, discussed limitations of sodium and fluids. She is aware of the sodium restriction.  Disease process; End stage processes discussed with POA. She has gfr of 17 and POA feels it's time to switch to comfort focus. Pt is a and o x 2 but lacks insight to understand current disease states. She does understand she is quite tired and DOE. Granddaughter felt that hospice support would be a good choice at this stage when curative measures are no longer offered and pt is in end stage disease processes.    Follow up Palliative Care Visit: refer to hospice   This visit was coded based on medical decision making (MDM).  PPS: 40%  HOSPICE ELIGIBILITY/DIAGNOSIS: yes/CHF  Chief Complaint: debility, edema  HISTORY OF PRESENT ILLNESS:  [name] [name] is a 88 y.o. year old female  with CHF, CKD, edema, debility, immobility . Patient seen today to review palliative care needs to include medical decision making and advance care planning as appropriate.   History obtained from review of EMR, discussion with primary team, and interview with family, facility staff/caregiver and/or Ms. [name] [name].  I reviewed available labs, medications, imaging, studies and related documents from the EMR.  Records reviewed and summarized above.   ROS   General: NAD ENMT: denies dysphagia Cardiovascular: denies chest pain, endorses  DOE Pulmonary: denies cough, denies increased SOB at rest  Abdomen: endorses good appetite, denies constipation, endorses incontinence of bowel GU: denies dysuria, endorses cinontinence of urine MSK:  endorses  increased weakness,  no falls reported Skin:  denies rashes or wounds Neurological: denies pain, denies insomnia Psych: Endorses positive mood  Physical Exam: Current and past weights:unavailable Constitutional: NAD General: frail appearing EYES: anicteric sclera, lids intact, no discharge  ENMT: intact hearing, oral mucous membranes moist, dentition intact CV: S1S2, RRR, 4+ bil LE edema Pulmonary: basilar rales bil, clear upper lobes, + increased work of breathing, no cough, room air Abdomen: intake 100%,+ascites MSK: mild sarcopenia, moves all extremities, non  ambulatory Skin: warm and dry, no rashes or wounds on visible skin Neuro: + generalized weakness, mild to mod cognitive impairment, non-anxious affect   Thank you for the opportunity to participate in the care of Ms. [name] [name].  The palliative care team will continue to follow. Please call our office at [phone] if we can be of additional assistance.   Jason Coop, NP DNP, AGPCNP-BC  COVID-19 PATIENT SCREENING TOOL Asked and negative response unless otherwise noted:   Have you had symptoms of covid, tested positive or been in contact with someone with symptoms/positive test in the past 5-10 days?

## 2022-03-28 NOTE — Therapy (Signed)
[place] MAIN [facility] SERVICES [address] [place], [place], [address] Phone: [phone]   Fax:  [phone]  Occupational Therapy Treatment Note and Progress Repeort: Lymphedema Care  Patient Details  Name: [name] [name] MRN: [id number] Date of Birth: [birth year] Referring Provider (OT): [name], [place]   Encounter Date: 03/25/2022   OT End of Session - 03/28/22 1343     Visit Number 10    Number of Visits 10- CG requested end of Intensive Phase CDT this date, rather than continue for total of 36 visits due to burden of care   Date for OT Re-Evaluation 04/15/22    OT Start Time 0200    OT Stop Time 0315    OT Time Calculation (min) 75 min    Activity Tolerance Patient tolerated treatment well;No increased pain;Patient limited by pain    Behavior During Therapy [facility] for tasks assessed/performed             Past Medical History:  Diagnosis Date   Anemia    CHF (congestive heart failure) (HCC)    Chronic kidney disease    Dementia ([place])    Diabetes mellitus without complication (HCC)    GIB (gastrointestinal bleeding)    Hyperlipidemia    Hypertension    Lymphedema    OSA on CPAP    Osteoarthritis    Vitamin D deficiency     Past Surgical History:  Procedure Laterality Date   ABDOMINAL HYSTERECTOMY      There were no vitals filed for this visit.   Subjective Assessment - 03/28/22 1352     Subjective  [name] [name] presents for OT Rx session to address BLE lymphedema. She was last seen on 03/12/22. She is accompanied by her granddaughter who pushed transport chair to clinic. Pt arrives with LLE knee length compression wraps in place and the recommended R compression garment alternative. [name] [name] LE-related leg pain described as heaviness. "We can make this our lest visit if you want/"    Patient is accompanied by: Family member    Pertinent History Medical Hx relevant to LE: Stage III CKD, HTN, OSA, CHF, DM, hx non-pressure  related chronic ulcer of R calf, Dementia, OA, chronic leg swelling w/ associated pain, impaired memory    Limitations difficulty walking, impaired transfers/ functional mobility, limited AROM, impaired sensation, decreased BLE AROM , generalized weakness, impaired basic and instrumental ADLs, limited functional performance of productivfe and leisure activities, limit social participation    Repetition Increases Symptoms    Special Tests +Stemmer    Pain Onset Other (comment)   4 yrs ago w onset of LE                         OT Treatments/Exercises (OP) - 03/28/22 1353       ADLs   ADL Education Given Yes      Manual Therapy   Manual Therapy Edema management    Edema Management BLE comparative limb volumetrics    Compression Bandaging RLE CircAid fitting and assessment. Set up for 30-40 mmHg.                    OT Education - 03/28/22 1354     Education Details Pt and CG educated on volumetrics outcome and progress towards all goals. CG educated on donning and doffing CircAid (Large/ LONG) OTS adjustable compression legging, wear and care regimes. CG able to don device and set each adjustable  band for 30-40 mmHg using calibration card after skilled teaching. Reviewed POC going forward for follow-along and PRN    Person(s) Educated Patient;Other (comment)   granddaughter, [name] [name]   Methods Explanation;Demonstration;Tactile cues;Verbal cues;Handout    Comprehension Verbalized understanding;Returned demonstration                 OT Long Term Goals - 03/28/22 1351       OT LONG TERM GOAL #1   Title With Max caregiver assistance Pt will demonstrate understanding of lymphedema prevention strategies by identifying and discussing 5 precautions using printed reference (modified assistance) to reduce risk of progression and to limit infection risk.    Baseline dependent    Time 4    Period Days    Status Achieved   CG met goal.   Target Date --   4th OT Rx  visit     OT LONG TERM GOAL #2   Title With Maximum caregiver assistance Pt will be able to apply multilayer, knee length, compression wraps using gradient techniques to decrease limb volume, to limit infection risk, and to limit lymphedema progression.    Baseline dependent    Time 4    Period Days    Status Achieved    Target Date --   4th OT visit     OT LONG TERM GOAL #3   Title Pt will achieve at least a 10% limb volume reduction below the knee bilaterally to return limb to more normal size and shape, to limit infection risk, to decrease pain, to improve function, and to limit lymphedema progression.    Baseline dependent    Time 12    Period Weeks    Status Partially Met   T leg reduction = 6.69%. L leg reduction = 9.38%.   Target Date 04/15/22      OT LONG TERM GOAL #4   Title With Max caregiver assistance Pt will achieve and sustain a least 85% compliance with all 4 LE self-care home program components throughout Intensive Phase CDT, including modified simple self-MLD, daily skin inspection and care, lymphatic pumping the ex, 23/7 compression wraps to optimize limb volume reductions, to limit lymphedema progression and to limit further functional decline.    Baseline dependent    Time 12    Period Weeks    Status Achieved      OT LONG TERM GOAL #5   Title Given this patient's low Intake score of TBA on the functional outcomes FOTO tool, patient will experience an increase in function of 3 points to improve basic and instrumental ADLs performance, including lymphedema self-care.    Baseline TBA    Time 12    Period Weeks    Status Deferred                   Plan - 03/28/22 1344     Clinical Impression Statement BLE comparative limb volumetrics reveal a 9.38% REDUCTION IN l LEG VOLUME , AND A 6.69% VOLUME REDUCTION IN THE r LEG   SINCE INITIALLY MEASURING LIMB VOLUMES ONM 01/28/22. While thease values do not meet the 10 % volume reduction goal for bilateral legs,  they are a significant achievement for someone with mobility as limited and activity level as decreased as Ms. Gowin at 88 y o. I congratulate her and her caregiver. Skin condition is improved in terms of hydration and flexibility with decreased infection risk as a result. Pt is fit with an off-the-shelf compression stocking alternative on the  RLE today. The CircAid adjustable compression lehgging is well tolerated by the Pt and is much easier on the caregiver than wraps. It is doubtful that this Patient would tolerate donning and doffing standard elastic stockings. Caregiver continues to consistently assist Pt with all LE self-care hme program components. They ahev decended to stop attending OT for lymphedema care  going forward due to high burden of care, but are in agreement with plan to continue with follow along visits and calls PRN. Please review LONF TERM GOALS section for prrogress to date. We'll see Ms Shorkey back in 3 months.    OT Occupational Profile and History Comprehensive Assessment- Review of records and extensive additional review of physical, cognitive, psychosocial history related to current functional performance    Occupational performance deficits (Please refer to evaluation for details): ADL's;IADL's;Rest and Sleep;Work;Leisure;Social Participation    Body Structure / Function / Physical Skills Decreased knowledge of use of DME;Decreased knowledge of precautions;Skin integrity;Edema;ADL;ROM;Mobility;Pain    Rehab Potential Good    Clinical Decision Making Multiple treatment options, significant modification of task necessary    Comorbidities Affecting Occupational Performance: Presence of comorbidities impacting occupational performance    Modification or Assistance to Complete Evaluation  Max significant modification of tasks or assist is necessary to complete    OT Frequency 2x / week   and PRN for follow-along   OT Duration 12 weeks   and PRN for caregiver and f/along support   OT  Treatment/Interventions Self-care/ADL training;DME and/or AE instruction;Manual lymph drainage;Therapeutic activities;Therapeutic exercise;Functional Mobility Training;Patient/family education;Manual Therapy    Plan Complete Decongestive therapy (CDT) to one limb at a time to limit falls risk and maximize function Wrap to below the knees to limit falls risk. : MLD, skin care to limit infection risk, lymphatic pumping ther ex, compression wrapping then appropriate compression garments/ device. Pt will require daily assistance from caregiver to participate in clinical program and home program due to limited mobility, cognition and functional ADLs performance.    Consulted and Agree with Plan of Care Patient;Family member/caregiver    Family Member Consulted Granddaughter, [name] [name]             Patient will benefit from skilled therapeutic intervention in order to improve the following deficits and impairments:   Body Structure / Function / Physical Skills: Decreased knowledge of use of DME, Decreased knowledge of precautions, Skin integrity, Edema, ADL, ROM, Mobility, Pain       Visit Diagnosis: Lymphedema, not elsewhere classified    Problem List Patient Active Problem List   Diagnosis Date Noted   AKI (acute kidney injury) ([place])    Acute on chronic diastolic CHF (congestive heart failure) ([place]) 10/24/2021   Wound of right leg, initial encounter 10/24/2021   SOB (shortness of breath)    Acute exacerbation of CHF (congestive heart failure) ([place]) 07/01/2021   CKD (chronic kidney disease), stage III ([place]) 07/01/2021   Essential hypertension 07/01/2021   Lymphedema 07/01/2021   Obstructive sleep apnea 07/01/2021   GERD (gastroesophageal reflux disease) 07/01/2021   Insomnia 07/01/2021   Primary osteoarthritis 07/01/2021   Angioedema 07/01/2021   Diverticulosis 06/02/2021   Impaired mobility 03/01/2021   Dementia without behavioral disturbance ([place]) 03/01/2021   History of GI bleed  01/04/2021   Hypokalemia 07/08/2018   Dyslipidemia 07/08/2018   Anemia in chronic kidney disease 07/08/2018   Iron deficiency 09/28/2017   Unspecified diastolic (congestive) heart failure ([place]) 06/19/2017   Superior mesenteric artery stenosis ([place]) 12/20/2016   Lumbar stenosis with neurogenic claudication  07/10/2015   Vitamin D deficiency 03/31/2015   Nonrheumatic mitral (valve) insufficiency 09/29/2014   Nonrheumatic aortic valve insufficiency 09/29/2014   Vitamin B12 deficiency 01/31/2014   Type 2 diabetes with nephropathy ([place]) 09/28/2013   Type 2 diabetes mellitus with diabetic chronic kidney disease ([place]) 09/28/2013   DDD (degenerative disc disease), lumbosacral 04/06/2012    Andrey Spearman, MS, OTR/L, CLT-LANA 03/28/22 2:00 PM   [place] MAIN [facility] SERVICES [place], [place], [address] Phone: [phone]   Fax:  [phone]  Name: Deslyn Cavenaugh MRN: [id number] Date of Birth: 09-22-1925

## 2022-03-28 NOTE — Progress Notes (Signed)
Rescheduled to tomorrow

## 2022-04-01 ENCOUNTER — Encounter: Payer: Medicare PPO | Admitting: Occupational Therapy

## 2022-04-03 ENCOUNTER — Encounter: Payer: Medicare PPO | Admitting: Occupational Therapy

## 2022-04-07 NOTE — Progress Notes (Unsigned)
MRN : [id number]  [name] [name] is a 88 y.o. (02-12-1925) female who presents with chief complaint of legs swell.  History of Present Illness:   Patient is seen for evaluation of leg swelling. The patient first noticed the swelling remotely but is now concerned because of a significant increase in the overall edema. The swelling isn't associated with significant pain.  There has been an increasing amount of  discoloration noted by the patient. The patient notes that in the morning the legs are improved but they steadily worsened throughout the course of the day. Elevation seems to make the swelling of the legs better, dependency makes them much worse.   There is no history of ulcerations associated with the swelling.   The patient denies any recent changes in their medications.  The patient has not been wearing graduated compression.  The patient has no had any past angiography, interventions or vascular surgery.  The patient denies a history of DVT or PE. There is no prior history of phlebitis. There is no history of primary lymphedema.  There is no history of radiation treatment to the groin or pelvis No history of malignancies. No history of trauma or groin or pelvic surgery. No history of foreign travel or parasitic infections area    No outpatient medications have been marked as taking for the 04/08/22 encounter (Appointment) with Delana Meyer, Dolores Lory, MD.    Past Medical History:  Diagnosis Date   Anemia    CHF (congestive heart failure) ([place])    Chronic kidney disease    Dementia ([place])    Diabetes mellitus without complication ([place])    GIB (gastrointestinal bleeding)    Hyperlipidemia    Hypertension    Lymphedema    OSA on CPAP    Osteoarthritis    Vitamin D deficiency     Past Surgical History:  Procedure Laterality Date   ABDOMINAL HYSTERECTOMY      Social History Social History   Tobacco Use   Smoking status: Never   Smokeless tobacco: Never   Vaping Use   Vaping Use: Never used  Substance Use Topics   Alcohol use: Never   Drug use: Never    Family History Family History  Problem Relation Age of Onset   Kidney disease Mother    Congestive Heart Failure Mother    Lung cancer Daughter     Allergies  Allergen Reactions   Codeine Swelling   Lisinopril Swelling    Other reaction(s): Other angioedema    Prednisolone     Other reaction(s): Other Causes increased blood pressure     REVIEW OF SYSTEMS (Negative unless checked)  Constitutional: [] Weight loss  [] Fever  [] Chills Cardiac: [] Chest pain   [] Chest pressure   [] Palpitations   [] Shortness of breath when laying flat   [] Shortness of breath with exertion. Vascular:  [] Pain in legs with walking   [x] Pain in legs with standing  [] History of DVT   [] Phlebitis   [x] Swelling in legs   [] Varicose veins   [] Non-healing ulcers Pulmonary:   [] Uses home oxygen   [] Productive cough   [] Hemoptysis   [] Wheeze  [] COPD   [] Asthma Neurologic:  [] Dizziness   [] Seizures   [] History of stroke   [] History of TIA  [] Aphasia   [] Vissual changes   [] Weakness or numbness in arm   [] Weakness or numbness in leg Musculoskeletal:   [] Joint swelling   [] Joint pain   [] Low back pain Hematologic:  [] Easy bruising  [] Easy bleeding   []   Hypercoagulable state   [] Anemic Gastrointestinal:  [] Diarrhea   [] Vomiting  [] Gastroesophageal reflux/heartburn   [] Difficulty swallowing. Genitourinary:  [] Chronic kidney disease   [] Difficult urination  [] Frequent urination   [] Blood in urine Skin:  [] Rashes   [] Ulcers  Psychological:  [] History of anxiety   []  History of major depression.  Physical Examination  There were no vitals filed for this visit. There is no height or weight on file to calculate BMI. Gen: WD/WN, NAD Head: [place]/AT, No temporalis wasting.  Ear/Nose/Throat: Hearing grossly intact, nares w/o erythema or drainage, pinna without lesions Eyes: PER, EOMI, sclera nonicteric.  Neck: Supple, no  gross masses.  No JVD.  Pulmonary:  Good air movement, no audible wheezing, no use of accessory muscles.  Cardiac: RRR, precordium not hyperdynamic. Vascular:  scattered varicosities present bilaterally.  Mild venous stasis changes to the legs bilaterally.  3-4+ soft pitting edema  Vessel Right Left  Radial Palpable Palpable  Gastrointestinal: soft, non-distended. No guarding/no peritoneal signs.  Musculoskeletal: M/S 5/5 throughout.  No deformity.  Neurologic: CN 2-12 intact. Pain and light touch intact in extremities.  Symmetrical.  Speech is fluent. Motor exam as listed above. Psychiatric: Judgment intact, Mood & affect appropriate for pt's clinical situation. Dermatologic: Venous rashes no ulcers noted.  No changes consistent with cellulitis. Lymph : No lichenification or skin changes of chronic lymphedema.  CBC Lab Results  Component Value Date   WBC 5.1 10/25/2021   HGB 11.1 (L) 10/25/2021   HCT 35.3 (L) 10/25/2021   MCV 84.2 10/25/2021   PLT 167 10/25/2021    BMET    Component Value Date/Time   NA 141 10/25/2021 1005   K 3.4 (L) 10/25/2021 1005   CL 103 10/25/2021 1005   CO2 29 10/25/2021 1005   GLUCOSE 261 (H) 10/25/2021 1005   BUN 53 (H) 10/25/2021 1005   CREATININE 1.97 (H) 10/25/2021 1005   CALCIUM 8.2 (L) 10/25/2021 1005   GFRNONAA 23 (L) 10/25/2021 1005   CrCl cannot be calculated (Patient's most recent lab result is older than the maximum 21 days allowed.).  COAG No results found for: "INR", "PROTIME"  Radiology No results found.   Assessment/Plan There are no diagnoses linked to this encounter.   Hortencia Pilar, MD  04/07/2022 4:16 PM

## 2022-04-08 ENCOUNTER — Encounter (INDEPENDENT_AMBULATORY_CARE_PROVIDER_SITE_OTHER): Payer: Self-pay | Admitting: Vascular Surgery

## 2022-04-08 ENCOUNTER — Encounter: Payer: Medicare PPO | Admitting: Occupational Therapy

## 2022-04-08 ENCOUNTER — Ambulatory Visit (INDEPENDENT_AMBULATORY_CARE_PROVIDER_SITE_OTHER): Admitting: Vascular Surgery

## 2022-04-08 VITALS — BP 177/82 | HR 67 | Resp 16

## 2022-04-08 DIAGNOSIS — I890 Lymphedema, not elsewhere classified: Secondary | ICD-10-CM

## 2022-04-08 DIAGNOSIS — E1121 Type 2 diabetes mellitus with diabetic nephropathy: Secondary | ICD-10-CM | POA: Diagnosis not present

## 2022-04-08 DIAGNOSIS — I10 Essential (primary) hypertension: Secondary | ICD-10-CM | POA: Diagnosis not present

## 2022-04-10 ENCOUNTER — Encounter: Payer: Medicare PPO | Admitting: Occupational Therapy

## 2022-04-14 ENCOUNTER — Encounter (INDEPENDENT_AMBULATORY_CARE_PROVIDER_SITE_OTHER): Payer: Self-pay | Admitting: Vascular Surgery

## 2022-04-15 ENCOUNTER — Encounter: Payer: Medicare PPO | Admitting: Occupational Therapy

## 2022-04-17 ENCOUNTER — Encounter: Payer: Medicare PPO | Admitting: Occupational Therapy

## 2022-04-24 ENCOUNTER — Encounter: Payer: Medicare PPO | Admitting: Occupational Therapy

## 2022-04-25 ENCOUNTER — Encounter: Payer: Medicare PPO | Admitting: Occupational Therapy

## 2022-05-20 ENCOUNTER — Ambulatory Visit: Payer: Medicare PPO | Attending: Physician Assistant | Admitting: Occupational Therapy

## 2022-07-19 ENCOUNTER — Other Ambulatory Visit: Payer: Self-pay | Admitting: Family

## 2022-07-21 ENCOUNTER — Other Ambulatory Visit: Payer: Self-pay | Admitting: Family

## 2022-08-19 ENCOUNTER — Encounter (INDEPENDENT_AMBULATORY_CARE_PROVIDER_SITE_OTHER): Payer: Self-pay

## 2022-09-09 ENCOUNTER — Encounter: Payer: Medicare PPO | Attending: Physician Assistant | Admitting: Physician Assistant

## 2022-09-09 NOTE — Progress Notes (Signed)
[name] [name], [name] [name] ([id number]) [id number] Page 1 of 10 Visit Report for 09/09/2022 Allergy List Details Patient Name: Date of Service: [name] [name] 09/09/2022 9:45 A M Medical Record Number: [id number] Patient Account Number: [id number] Date of Birth/Sex: Treating RN: 25-Dec-1924 (88 y.o. [name] [name] Primary Care Laurelai Lepp: [name], [name] Other Clinician: Referring Gianlucca Szymborski: Treating Franchon Ketterman/Extender: [name] [name], [name]: 0 Allergies Active Allergies lisinopril Severity: Moderate prednisone Severity: Moderate codeine Reaction: swelling Severity: Severe Allergy Notes Electronic Signature(s) Signed: 09/09/2022 1:46:28 PM By: Gretta Cool, BSN, RN, CWS, Kim RN, BSN Entered By: Gretta Cool, BSN, RN, CWS, Kim on 09/09/2022 10:22:29 -------------------------------------------------------------------------------- Arrival Information Details Patient Name: Date of Service: [name] [name] [name] 09/09/2022 9:45 A M Medical Record Number: [id number] Patient Account Number: [id number] Date of Birth/Sex: Treating RN: 08-04-1925 (88 y.o. [name] [name] Primary Care Chaneka Trefz: [name], [name] Other Clinician: Referring Sheilia Reznick: Treating Conard Alvira/Extender: [name] [name], [name]: 0 Visit Information Patient Arrived: Wheel Chair Arrival Time: 10:01 Accompanied By: granddaughter Transfer Assistance: Manual Patient Identification Verified: Yes Secondary Verification Process CompletedJERIANN, [name] Patient Has Alerts: Yes Patient Alerts: Type II Diabetic Not able to complete A DT Pain Hospice Care History Since Last Visit Pain Present Now: No [id number] Page 2 of 10 BI Electronic Signature(s) Signed: 09/09/2022 1:46:28 PM By: Gretta Cool, BSN, RN, CWS, Kim RN, BSN Entered By: Gretta Cool, BSN, RN, CWS, Kim on 09/09/2022  10:22:18 -------------------------------------------------------------------------------- Clinic Level of Care Assessment Details Patient Name: Date of Service: [name] [name] [name] 09/09/2022 9:45 A M Medical Record Number: [id number] Patient Account Number: [id number] Date of Birth/Sex: Treating RN: [birth year] (88 y.o. [name] [name] Primary Care Mariadelcarmen Corella: [name], [name] Other Clinician: Referring Lockie Bothun: Treating Dorthy Hustead/Extender: [name] [name], [name]: 0 Clinic Level of Care Assessment Items TOOL 1 Quantity Score _0  - 0 Use when EandM and Procedure is performed on INITIAL visit ASSESSMENTS - Nursing Assessment / Reassessment X- 1 20 General Physical Exam (combine w/ comprehensive assessment (listed just below) when performed on new pt. evals) X- 1 25 Comprehensive Assessment (HX, ROS, Risk Assessments, Wounds Hx, etc.) ASSESSMENTS - Wound and Skin Assessment / Reassessment _1  - 0 Dermatologic / Skin Assessment (not related to wound area) ASSESSMENTS - Ostomy and/or Continence Assessment and Care _2  - 0 Incontinence Assessment and Management _3  - 0 Ostomy Care Assessment and Management (repouching, etc.) PROCESS - Coordination of Care X - Simple Patient / Family Education for ongoing care 1 15 _4  - 0 Complex (extensive) Patient / Family Education for ongoing care X- 1 10 Staff obtains Programmer, systems, Records, T Results / Process Orders est _5  - 0 Staff telephones HHA, Nursing Homes / Clarify orders / etc _6  - 0 Routine Transfer to another Facility (non-emergent condition) _7  - 0 Routine Hospital Admission (non-emergent condition) X- 1 15 New Admissions / Biomedical engineer / Ordering NPWT Apligraf, etc. , _8  - 0 Emergency Hospital Admission (emergent condition) PROCESS - Special Needs _9  - 0 Pediatric / Minor Patient Management _10  - 0 Isolation Patient Management _11  - 0 Hearing / Language / Visual special needs _12  - 0 Assessment  of Community assistance (transportation, D/C planning, etc.) _13  - 0 Additional assistance / Altered mentation [name] [name], [name] [name] ([id number]) [id number] Page 3 of 10 _14  - 0 Support Surface(s) Assessment (bed, cushion, seat, etc.) INTERVENTIONS - Miscellaneous _15  - 0 External ear exam _16  - 0 Patient Transfer (multiple staff / Civil Service fast streamer / Similar devices) _17  - 0 Simple Staple / Suture removal (25  or less) _0  - 0 Complex Staple / Suture removal (26 or more) _1  - 0 Hypo/Hyperglycemic Management (do not check if billed separately) _2  - 0 Ankle / Brachial Index (ABI) - do not check if billed separately Has the patient been seen at the hospital within the last three years: Yes Total Score: 85 Level Of Care: New/Established - Level 3 Electronic Signature(s) Signed: 09/09/2022 1:46:28 PM By: Gretta Cool, BSN, RN, CWS, Kim RN, BSN Entered By: Gretta Cool, BSN, RN, CWS, Kim on 09/09/2022 11:29:04 -------------------------------------------------------------------------------- Encounter Discharge Information Details Patient Name: Date of Service: [name] [name] [name] [name] 09/09/2022 9:45 A M Medical Record Number: [id number] Patient Account Number: [id number] Date of Birth/Sex: Treating RN: 1925/03/05 (88 y.o. [name] [name] Primary Care Loveda Colaizzi: [name], [name] Other Clinician: Referring Oliver Heitzenrater: Treating Juliene Kirsh/Extender: [name] [name], [name]: 0 Encounter Discharge Information Items Discharge Condition: Stable Ambulatory Status: Wheelchair Discharge Destination: Home Transportation: Private Auto Accompanied By: granddaughter Schedule Follow-up Appointment: Yes Clinical Summary of Care: Electronic Signature(s) Signed: 09/09/2022 11:46:43 AM By: Gretta Cool, BSN, RN, CWS, Kim RN, BSN Entered By: Gretta Cool, BSN, RN, CWS, Kim on 09/09/2022 11:46:43 -------------------------------------------------------------------------------- Lower Extremity Assessment  Details Patient Name: Date of Service: [name] [name] [facility] [name] 09/09/2022 9:45 A M Medical Record Number: [id number] Patient Account Number: [id number] [name] [name], [name] [name] ([id number]) [id number] Page 4 of 10 Date of Birth/Sex: Treating RN: 06-26-1925 (88 y.o. [name] [name] Primary Care Madalynne Gutmann: Other Clinician: [name], [name] Referring Sneha Willig: Treating Yury Schaus/Extender: [name] [name], [name]: 0 Edema Assessment Assessed: [Left: No] [Right: Yes] Edema: [Left: Ye] [Right: s] Calf Left: Right: Point of Measurement: 24 cm From Medial Instep 42 cm Ankle Left: Right: Point of Measurement: 12 cm From Medial Instep 30.4 cm Vascular Assessment Pulses: Dorsalis Pedis Palpable: [Right:Yes] Notes ABI attempted with patient grimacing in pain at 54mHG. RN Stopped. Patient is under Hospice Care. Electronic Signature(s) Signed: 09/09/2022 1:46:28 PM By: WGretta Cool BSN, RN, CWS, Kim RN, BSN Entered By: WGretta Cool BSN, RN, CWS, Kim on 09/09/2022 10:21:35 -------------------------------------------------------------------------------- Multi Wound Chart Details [facility]: [facility] 9:45 A M Medical Record Number: [id number] Account Number: [id number] of Birth/Sex: Treating RN: [birth year](88 y.o. FMarlowe ShoresPrimary Care Zymir Napoli: [name], JMary SellaOther Clinician: Referring Brayleigh Rybacki: Treating Kherington Meraz/Extender: SJeri CosLeigh-Fleming, [name]: 0 Vital Signs Height(in): 60 Pulse(bpm): 735Weight(lbs): 180 Blood Pressure(mmHg): 168/79 Body Mass Index(BMI): 35.2 Temperature(F): 97.5 Respiratory Rate(breaths/min): 16 [2:Photos:] [N/A:N/A] [name] [name], [name] [name] ([id number] [2:Right, Lateral Malleolus Wound Location: Blister Wounding Event: Diabetic Wound/Ulcer of the Lower Primary Etiology: Extremity Cataracts, Sleep Apnea, Congestive N/A Comorbid History: Heart Failure, Coronary  Artery Disease, Hypertension, Type II  Diabetes, End Stage Renal Disease, Osteoarthritis, Dementia, Neuropathy 08/26/2022 Date Acquired: 0 Weeks of Treatment: Open Wound Status: No Wound Recurrence: 3.8x4x0.1 Measurements L x W x D (cm) 11.938 A (cm) : [name] [name] 1.194 Volume (cm) : Grade 1  Classification: Large Exudate A mount: Serous Exudate Type: amber Exudate Color: Flat and Intact Wound Margin: Large (67-100%) Granulation A mount: Red, Friable Granulation Quality: Small (1-33%) Necrotic A mount: Fat Layer (Subcutaneous Tissue): Yes N/A  Exposed Structures: Fascia: No Tendon: No Muscle: No Joint: No Bone: No None Epithelialization:] [N/A:N/A N/A N/A N/A N/A N/A N/A N/A N/A N/A N/A N/A N/A N/A N/A N/A N/A N/A N/A] Treatment Notes Electronic Signature(s) Signed: 09/09/2022 1:46:28 PM By: WGretta Cool BSN, RN, CWS, Kim RN, BSN Entered By: WGretta Cool BSN, RN, CWS, Kim on 09/09/2022 10:45:34 -------------------------------------------------------------------------------- [name] [name]  Details Patient Name: Date of Service: [name] [name] 09/09/2022 9:45 A M Medical Record Number: [id number] Patient Account Number: [id number] Date of Birth/Sex: Treating RN: 03-14-1925 (88 y.o. [name] [name] Primary Care Divine Imber: [name], [name] Other Clinician: Referring Chanise Habeck: Treating Joline Encalada/Extender: [name] [name], [name]: 0 Active Inactive Abuse / Safety / Falls / Self Care Management Nursing Diagnoses: Potential for falls Goals: Patient/caregiver will verbalize understanding of skin care regimen Date Initiated: 09/09/2022 Target Resolution Date: 09/09/2022 Goal Status: Active Interventions: Assess self care needs on admission and as needed Notes: [name] [name], [name] [name] ([id number]) [id number] Page 6 of 10 Orientation to the Wound Care Program Nursing Diagnoses: Knowledge deficit related to the wound healing [name]  program Goals: Patient/caregiver will verbalize understanding of the [place] Program Date Initiated: 09/09/2022 Target Resolution Date: 09/09/2022 Goal Status: Active Interventions: Provide education on orientation to the wound [name] Notes: Pain, Acute or Chronic Nursing Diagnoses: Pain, acute or chronic: actual or potential Goals: Patient/caregiver will verbalize comfort level met Date Initiated: 09/09/2022 Target Resolution Date: 09/09/2022 Goal Status: Active Interventions: Assess comfort goal upon admission Treatment Activities: Administer pain control measures as ordered : 09/09/2022 Refer to pain specialist or other pain support program : 09/09/2022 Notes: Wound/Skin Impairment Nursing Diagnoses: Impaired tissue integrity Goals: Patient/caregiver will verbalize understanding of skin care regimen Date Initiated: 09/09/2022 Target Resolution Date: 09/09/2022 Goal Status: Active Ulcer/skin breakdown will have a volume reduction of 30% by week 4 Date Initiated: 09/09/2022 Target Resolution Date: 09/23/2022 Goal Status: Active Interventions: Assess ulceration(s) every visit Treatment Activities: Skin care regimen initiated : 09/09/2022 Topical wound management initiated : 09/09/2022 Notes: Electronic Signature(s) Signed: 09/09/2022 1:46:28 PM By: Gretta Cool, BSN, RN, CWS, Kim RN, BSN Entered By: Gretta Cool, BSN, RN, CWS, Kim on 09/09/2022 10:45:18 -------------------------------------------------------------------------------- Pain Assessment Details Patient Name: Date of Service: [name] [name] [facility] [name] 09/09/2022 9:45 A Brunetta Genera ([id number]) [id number] Page 7 of 10 Medical Record Number: [id number] Patient Account Number: [id number] Date of Birth/Sex: Treating RN: 05-16-1925 (88 y.o. [name] [name] Primary Care Candy Leverett: [name], [name] Other Clinician: Referring Krystn Dermody: Treating Meyer Dockery/Extender: [name] [name],  [name]: 0 Active Problems Location of Pain Severity and Description of Pain Patient Has Paino Yes Site Locations Pain Location: Pain in Ulcers With Dressing Change: Yes Duration of the Pain. Constant / Intermittento Intermittent Rate the pain. Current Pain Level: 3 Character of Pain Describe the Pain: Throbbing Pain Management and Medication Current Pain Management: Notes Patient states she was not in pain until I removed the dressing. Electronic Signature(s) Signed: 09/09/2022 1:46:28 PM By: Gretta Cool, BSN, RN, CWS, Kim RN, BSN Entered By: Gretta Cool, BSN, RN, CWS, Kim on 09/09/2022 10:08:39 -------------------------------------------------------------------------------- Patient/Caregiver Education Details Patient Name: Date of Service: [name] [name] [name] [name] 11/20/2023andnbsp9:45 A M Medical Record Number: [id number] Patient Account Number: [id number] Date of Birth/Gender: Treating RN: Mar 02, 1925 (88 y.o. [name] [name] Primary Care Physician: [name], [name] Other Clinician: Referring Physician: Treating Physician/Extender: [name] [name], [name]: 0 Education Assessment Education Provided To: Patient Education Topics Provided [place]: [name] [name], [name] [name] ([id number]) [id number] Page 8 of 10 Handouts: Welcome T [facility] o Methods: Demonstration, Explain/Verbal Responses: State content correctly Wound/Skin Impairment: Handouts: Caring for Your Ulcer Methods: Demonstration, Explain/Verbal Responses: State content correctly Electronic Signature(s) Signed: 09/09/2022 1:46:28 PM By: Gretta Cool, BSN, RN, CWS, Kim RN, BSN Entered By: Gretta Cool, BSN, RN, CWS, Kim on 09/09/2022 11:45:46 -------------------------------------------------------------------------------- Wound Assessment Details Patient Name: Date of Service:  [name] [name], [name] [name] [name] 09/09/2022 9:45 A M Medical Record Number:  [id number] Patient Account Number: [id number] Date of Birth/Sex: Treating RN: 03-21-1925 (88 y.o. Charolette Forward, Maudie Mercury Primary Care Sande Pickert: [name], [name] Other Clinician: Referring Albert Devaul: Treating Tiajah Oyster/Extender: [name] [name], [name]: 0 Wound Status Wound Number: 2 Primary Diabetic Wound/Ulcer of the Lower Extremity Etiology: Wound Location: Right, Lateral Malleolus Wound Open Wounding Event: Blister Status: Date Acquired: 08/26/2022 Comorbid Cataracts, Sleep Apnea, Congestive Heart Failure, Coronary Artery Weeks Of Treatment: 0 History: Disease, Hypertension, Type II Diabetes, End Stage Renal Clustered Wound: No Disease, Osteoarthritis, Dementia, Neuropathy Photos Wound Measurements Length: (cm) 3.8 Width: (cm) 4 Depth: (cm) 0.1 Area: (cm) 11.938 Volume: (cm) 1.194 % Reduction in Area: % Reduction in Volume: Epithelialization: None Tunneling: No Undermining: No Wound Description Classification: Grade 1 Wound Margin: Flat and Intact Exudate Amount: Large Exudate Type: Serous Exudate Color: amber [name] [name], [name] [name] ([id number]) Wound Bed Granulation Amount: Large (67-1 Granulation Quality: Red, Friabl Necrotic Amount: Small (1-33 Necrotic Quality: Adherent Sl Foul Odor After Cleansing: No Slough/Fibrino Yes [id number] Page 9 of 10 00%) Exposed Structure e Fascia Exposed: No %) Fat Layer (Subcutaneous Tissue) Exposed: Yes ough Tendon Exposed: No Muscle Exposed: No Joint Exposed: No Bone Exposed: No Treatment Notes Wound #2 (Malleolus) Wound Laterality: Right, Lateral Cleanser Soap and Water Discharge Instruction: Gently cleanse wound with antibacterial soap, rinse and pat dry prior to dressing wounds Peri-Wound Care Topical Primary Dressing Xeroform 4x4-HBD (in/in) Discharge Instruction: Apply Xeroform 4x4-HBD (in/in) as directed Secondary Dressing ABD Pad 5x9 (in/in) Discharge Instruction: Cover  with ABD pad Secured With Tubigrip Size D, 3x10 (in/yd) Compression Wrap Compression Stockings Add-Ons Electronic Signature(s) Signed: 09/09/2022 1:46:28 PM By: Gretta Cool, BSN, RN, CWS, Kim RN, BSN Entered By: Gretta Cool, BSN, RN, CWS, Kim on 09/09/2022 10:27:24 -------------------------------------------------------------------------------- Vitals Details Patient Name: Date of Service: [name] [name] [facility] [name] 09/09/2022 9:45 A M Medical Record Number: [id number] Patient Account Number: [id number] Date of Birth/Sex: Treating RN: 1925/05/27 (88 y.o. [name] [name] Primary Care Tyreona Panjwani: [name], [name] Other Clinician: Referring Breyah Akhter: Treating Colbert Curenton/Extender: [name] [name], [name]: 0 Vital Signs Time Taken: 08:47 Temperature (F): 97.5 Height (in): 60 Pulse (bpm): 73 Weight (lbs): 180 Respiratory Rate (breaths/min): 16 Body Mass Index (BMI): 35.2 Blood Pressure (mmHg): 168/79 Reference Range: 80 - 120 mg / dl Electronic Signature(s) Signed: 09/09/2022 1:46:28 PM By: Gretta Cool, BSN, RN, CWS, Kim RN, BSN [name] [name], [name] [name] ([id number]) [phone].pdf Page 10 of 10 Entered By: Gretta Cool, BSN, RN, CWS, Kim on 09/09/2022 10:10:37

## 2022-09-09 NOTE — Progress Notes (Signed)
WYATT, GALVAN ([id number]) [id number] Page 1 of 5 Visit Report for 09/09/2022 Abuse Risk Screen Details Patient Name: Date of Service: [name] [name] 09/09/2022 9:45 A M Medical Record Number: [id number] Patient Account Number: [id number] Date of Birth/Sex: Treating RN: 08-06-1925 (88 y.o. [name] [name] Primary Care Raney Antwine: [name], [name] Other Clinician: Referring Sharon Stapel: Treating Kass Herberger/Extender: [name] [name], [name]: 0 Abuse Risk Screen Items Answer ABUSE RISK SCREEN: Has anyone close to you tried to hurt or harm you recentlyo No Do you feel uncomfortable with anyone in your familyo No Has anyone forced you do things that you didnt want to doo No Electronic Signature(s) Signed: 09/09/2022 1:46:28 PM By: Gretta Cool, BSN, RN, CWS, Kim RN, BSN Entered By: Gretta Cool, BSN, RN, CWS, Kim on 09/09/2022 10:23:47 -------------------------------------------------------------------------------- Activities of Daily Living Details Patient Name: Date of Service: [name] [name] [name] 09/09/2022 9:45 A M Medical Record Number: [id number] Patient Account Number: [id number] Date of Birth/Sex: Treating RN: [birth year] (88 y.o. [name] [name] Primary Care Xiara Knisley: [name], [name] Other Clinician: Referring Rhonna Holster: Treating Suleyma Wafer/Extender: [name] [name], [name]: 0 Activities of Daily Living Items Answer Activities of Daily Living (Please select one for each item) Drive Automobile Not Able T Medications ake Need Assistance Use T elephone Need Assistance Care for Appearance Need Assistance Use T oilet Need Assistance Bath / Shower Need Assistance Dress Self Need Assistance Feed Self Need Assistance Walk Need Assistance Get In / Out Bed Need Assistance Housework Need Assistance Shady [name], [name] ([id number]) [phone] [email or web address] Page 2 of 5 Prepare Meals Need  Assistance Handle Money Need Assistance Shop for Self Need Radio producer) Signed: 09/09/2022 1:46:28 PM By: Gretta Cool, BSN, RN, CWS, Kim RN, BSN Entered By: Gretta Cool, BSN, RN, CWS, Kim on 09/09/2022 10:23:57 -------------------------------------------------------------------------------- Education Screening Details Patient Name: Date of Service: [name] [name] [name] 09/09/2022 9:45 A M Medical Record Number: [id number] Patient Account Number: [id number] Date of Birth/Sex: Treating RN: 1925/01/08 (88 y.o. [name] [name] Primary Care Aboubacar Matsuo: [name], [name] Other Clinician: Referring Candyce Gambino: Treating Makiyah Zentz/Extender: [name] [name], [name]: 0 Primary Learner Assessed: Caregiver Olin Hauser, granddaughter Reason Patient is not Primary Learner: Dementia Learning Preferences/Education Level/Primary Language Preferred Language: English Cognitive Barrier Language Barrier: No Physical Barrier Impaired Vision: No Impaired Hearing: No Knowledge/Comprehension Knowledge Level: High Comprehension Level: High Ability to understand written instructions: High Ability to understand verbal instructions: High Motivation Anxiety Level: Calm Cooperation: Cooperative Education Importance: Acknowledges Need Interest in Health Problems: Asks Questions Perception: Coherent Willingness to Engage in Self-Management High Activities: Readiness to Engage in Self-Management High Activities: Electronic Signature(s) Signed: 09/09/2022 1:46:28 PM By: Gretta Cool, BSN, RN, CWS, Kim RN, BSN Entered By: Gretta Cool, BSN, RN, CWS, Kim on 09/09/2022 10:24:57 [name] [name] ([id number]) [id number] Page 3 of 5 -------------------------------------------------------------------------------- Fall Risk Assessment Details Patient Name: Date of Service: [name] [name] 09/09/2022 9:45 A M Medical Record Number: [id number] Patient Account Number:  [id number] Date of Birth/Sex: Treating RN: Sep 11, 1925 (88 y.o. Charolette Forward, Maudie Mercury Primary Care Sadira Standard: [name], [name] Other Clinician: Referring Cheryll Keisler: Treating Lum Stillinger/Extender: [name] [name], [name]: 0 Fall Risk Assessment Items Have you had 2 or more falls in the last 12 monthso 0 Yes Have you had any fall that resulted in injury in the last 12 monthso 0 No FALLS RISK SCREEN History of falling - immediate or within 3 months 25 Yes Secondary diagnosis (Do you have 2 or more medical diagnoseso) 0 No Ambulatory aid None/bed rest/wheelchair/nurse 0  No Crutches/cane/walker 15 Yes Furniture 0 No Intravenous therapy Access/Saline/Heparin Lock 0 No Gait/Transferring Normal/ bed rest/ wheelchair 0 Yes Weak (short steps with or without shuffle, stooped but able to lift head while walking, may seek 0 No support from furniture) Impaired (short steps with shuffle, may have difficulty arising from chair, head down, impaired 0 No balance) Mental Status Oriented to own ability 0 Yes Electronic Signature(s) Signed: 09/09/2022 1:46:28 PM By: Gretta Cool, BSN, RN, CWS, Kim RN, BSN Entered By: Gretta Cool, BSN, RN, CWS, Kim on 09/09/2022 10:25:34 -------------------------------------------------------------------------------- Foot Assessment Details Patient Name: Date of Service: [name] [name] [name] 09/09/2022 9:45 A M Medical Record Number: [id number] Patient Account Number: [id number] Date of Birth/Sex: Treating RN: 15-Feb-1925 (88 y.o. [name] [name] Primary Care Mollie Rossano: [name], [name] Other Clinician: Referring Upton Russey: Treating Avyukt Cimo/Extender: [name] [name], [name]: 0 Foot Assessment Items Site Locations [place], [place] ([id number]) [phone] [id number] Page 4 of 5 + = Sensation present, - = Sensation absent, C = Callus, U = Ulcer R = Redness, W = Warmth, M = Maceration, PU = Pre-ulcerative lesion F = Fissure,  S = Swelling, D = Dryness Assessment Right: Left: Other Deformity: No No Prior Foot Ulcer: No No Prior Amputation: No No Charcot Joint: No No Ambulatory Status: Ambulatory With Help Assistance Device: Walker Gait: Administrator, arts) Signed: 09/09/2022 1:46:28 PM By: Gretta Cool, BSN, RN, CWS, Kim RN, BSN Entered By: Gretta Cool, BSN, RN, CWS, Kim on 09/09/2022 10:26:08 -------------------------------------------------------------------------------- Nutrition Risk Screening Details Patient Name: Date of Service: [name] [name] [name] 09/09/2022 9:45 A M Medical Record Number: [id number] Patient Account Number: [id number] Date of Birth/Sex: Treating RN: 02-25-1925 (88 y.o. Charolette Forward, Maudie Mercury Primary Care Myiesha Edgar: [name], [name] Other Clinician: Referring Sinai Mahany: Treating Milbern Doescher/Extender: [name] [name], [name]: 0 Height (in): 60 Weight (lbs): 180 Body Mass Index (BMI): 35.2 Nutrition Risk Screening Items Score Screening NUTRITION RISK SCREEN: I have an illness or condition that made me change the kind and/or amount of food I eat 0 No I eat fewer than two meals per day 0 No I eat few fruits and vegetables, or milk products 0 No I have three or more drinks of beer, liquor or wine almost every day 0 No I have tooth or mouth problems that make it hard for me to eat 0 No [name] [name], [name] [name] ([id number]) [id number] Page 5 of 5 I don't always have enough money to buy the food I need 0 No I eat alone most of the time 0 No I take three or more different prescribed or over-the-counter drugs a day 1 Yes Without wanting to, I have lost or gained 10 pounds in the last six months 0 No I am not always physically able to shop, cook and/or feed myself 0 No Nutrition Protocols Good Risk Protocol 0 No interventions needed Moderate Risk Protocol High Risk Proctocol Risk Level: Good Risk Score: 1 Electronic Signature(s) Signed:  09/09/2022 1:46:28 PM By: Gretta Cool, BSN, RN, CWS, Kim RN, BSN Entered By: Gretta Cool, BSN, RN, CWS, Kim on 09/09/2022 10:25:46

## 2022-09-10 NOTE — Progress Notes (Signed)
MARKELA, WEE ([id number]) [id number] Page 1 of 8 Visit Report for 09/09/2022 Chief Complaint Document Details Patient Name: Date of Service: [name] [name] 09/09/2022 9:45 A M Medical Record Number: [id number] Patient Account Number: [id number] Date of Birth/Sex: Treating RN: [birth year] (88 y.o. [place]e [place]ores Primary Care Provider: [name], [name] Other Clinician: Referring Provider: Treating Provider/Extender: [name] [name], [name]: 0 Information Obtained from: Patient Chief Complaint Right Lateral Ankle Ulcer Electronic Signature(s) Signed: 09/09/2022 10:34:54 AM By: Worthy Keeler PA-C Previous Signature: 09/09/2022 10:34:37 AM Version By: Worthy Keeler PA-C Entered By: Worthy Keeler on 09/09/2022 10:34:54 -------------------------------------------------------------------------------- HPI Details Patient Name: Date of Service: [name] [name] [facility] [name] 09/09/2022 9:45 A M Medical Record Number: [id number] Patient Account Number: [id number] Date of Birth/Sex: Treating RN: Jan 06, 1925 (88 y.o. [place]e [place] Primary Care Provider: [name], [name] Other Clinician: Referring Provider: Treating Provider/Extender: [name] [name], [name]: 0 History of Present Illness HPI Description: ADMISSION 11/09/2021; this is a 88 year old woman who was accompanied by her caregiver who is her granddaughter. She has had a wound on her medial right calf since the beginning of December. Not exactly clear how this started. This was recently seen by palliative care and I think referred here. She has [facility] home health they have been using Vaseline gauze and her own stockings. She has chronic lymphedema and has had this for a number of years she has thigh-high compression stockings. She also has an appointment with Dr. Delana Meyer on Monday at [facility] vein and vascular. She was recently hospitalized for acute on  chronic diastolic heart failure and currently is getting IV Lasix as an outpatient 5 days a week. According to granddaughter her weight is come down nicely. Past medical history includes type 2 diabetes with peripheral neuropathy, hypertension, chronic diastolic congestive heart failure, stage IV chronic kidney disease ABI was very difficult to get in our clinic but we got a very low value of 0.48. The patient does not easily complain of claudication or pain in the leg 11/16/2021 upon evaluation today patient appears to be doing well currently in regard to her wounds. In fact the wound appears to be completely healed which is great news. She does want to see about a referral to the lymphedema clinic. CORAIMA, TIBBS ([id number]) [id number] Page 2 of 8 Readmission: 09-10-2022 upon evaluation today patient's wound appears to be a blister on the lateral aspect of her right ankle which has been present currently for a couple of weeks. She could not get in here as quickly as they would like so she did go to see podiatry and they have taken pretty good care of this they ruptured the blister and had for in a Unna boot wrap. With that being said they have been using Betadine over top of that. I do believe she may be better with the Xeroform gauze dressing to be honest I think this will not stick and will actually overall doing much better for her. Patient's family members in agreement with that plan. Otherwise her past medical history really has not changed significantly. She is currently under the care of hospice. Electronic Signature(s) Signed: 09/09/2022 11:08:04 AM By: Worthy Keeler PA-C Entered By: Worthy Keeler on 09/09/2022 11:08:04 -------------------------------------------------------------------------------- Physical Exam Details Patient Name: Date of Service: [name] [name] [facility] [name] 09/09/2022 9:45 A M Medical Record Number: [id number] Patient Account Number:  [id number] Date of Birth/Sex: Treating RN: 1924/11/18 (88 y.o. [place]e [place]ores Primary Care Provider: [name], [name]  Other Clinician: Referring Provider: Treating Provider/Extender: [name] [name], [name]: 0 Constitutional patient is hypertensive.. pulse regular and within target range for patient.Marland Kitchen respirations regular, non-labored and within target range for patient.Marland Kitchen temperature within target range for patient.. Obese and well-hydrated in no acute distress. Eyes conjunctiva clear no eyelid edema noted. pupils equal round and reactive to light and accommodation. Ears, Nose, Mouth, and Throat no gross abnormality of ear auricles or external auditory canals. normal hearing noted during conversation. mucus membranes moist. Respiratory normal breathing without difficulty. Cardiovascular 1+ dorsalis pedis/posterior tibialis pulses. 4+ pitting edema of the bilateral lower extremities. Musculoskeletal Patient unable to walk without assistance. Psychiatric this patient is able to make decisions and demonstrates good insight into disease process. Alert and Oriented x 3. pleasant and cooperative. Notes Upon inspection patient's wound bed actually showed signs again of a ruptured blister there was one small area where this appears to be a little bit deeper there was some slough so this does appear to be full-thickness but nonetheless overall is not doing too poorly. She does seem to be tolerating the dressing changes without complication and in general and extremely pleased with where we stand in her leg is very swollen but we have to be careful with compressing due to the fact that she does have congestive heart failure and issues currently with fluid retention causing issues as far as her breathing is concerned. I think for that reason that she probably is going to need to be minimally compressed if anything so that we do not overload her. Electronic  Signature(s) Signed: 09/09/2022 11:08:50 AM By: Worthy Keeler PA-C Entered By: Worthy Keeler on 09/09/2022 11:08:49 [name] ([id number]) [id number] Page 3 of 8 -------------------------------------------------------------------------------- Physician Orders Details Patient Name: Date of Service: [name] [name] 09/09/2022 9:45 A M Medical Record Number: [id number] Patient Account Number: [id number] Date of Birth/Sex: Treating RN: 1925/03/22 (88 y.o. [place]e [place] Primary Care Provider: [name], [name] Other Clinician: Referring Provider: Treating Provider/Extender: [name] [name], [name]: 0 Verbal / Phone Orders: No Diagnosis Coding ICD-10 Coding Code Description I89.0 Lymphedema, not elsewhere classified L97.312 Non-pressure chronic ulcer of right ankle with fat layer exposed V95.63 Chronic diastolic (congestive) heart failure Follow-up Appointments Return Appointment in 3 weeks. Nurse Visit as needed [place] (Hospice) [facility] for wound care. May utilize formulary equivalent dressing for wound treatment orders unless otherwise specified. Home Health Nurse may visit PRN to address patients wound care needs. Scheduled days for dressing changes to be completed; exception, patient has scheduled wound care visit that day. **Please direct any NON-WOUND related issues/requests for orders to patient's Primary Care Physician. **If current dressing causes regression in wound condition, may D/C ordered dressing product/s and apply Normal Saline Moist Dressing daily until next [place] or Other MD appointment. **Notify Wound Healing Center of regression in wound condition at [phone]. Bathing/ Shower/ Hygiene May shower; gently cleanse wound with antibacterial soap, rinse and pat dry prior to dressing wounds Wound Treatment Wound #2 - Malleolus Wound  Laterality: Right, Lateral Cleanser: Soap and Water (Home Health) 2 x Per Week/30 Days Discharge Instructions: Gently cleanse wound with antibacterial soap, rinse and pat dry prior to dressing wounds Prim Dressing: Xeroform 4x4-HBD (in/in) (Home Health) 2 x Per Week/30 Days ary Discharge Instructions: Apply Xeroform 4x4-HBD (in/in) as directed Secondary Dressing: ABD Pad 5x9 (in/in) (Home Health) 2 x Per Week/30 Days Discharge Instructions: Cover with  ABD pad Secured With: Tubigrip Size D, 3x10 (in/yd) (Home Health) 2 x Per Week/30 Days Electronic Signature(s) Signed: 09/09/2022 1:46:28 PM By: Gretta Cool, BSN, RN, CWS, Kim RN, BSN Signed: 09/10/2022 8:14:16 AM By: Worthy Keeler PA-C Entered By: Gretta Cool, BSN, RN, CWS, Kim on 09/09/2022 10:48:04 [name] ([id number]) [id number] Page 4 of 8 -------------------------------------------------------------------------------- Problem List Details Patient Name: Date of Service: [name] [name] 09/09/2022 9:45 A M Medical Record Number: [id number] Patient Account Number: [id number] Date of Birth/Sex: Treating RN: Apr 25, 1925 (88 y.o. [place] Primary Care Provider: [name], [name] Other Clinician: Referring Provider: Treating Provider/Extender: [name] [name], [name]: 0 Active Problems ICD-10 Encounter Code Description Active Date MDM Diagnosis I89.0 Lymphedema, not elsewhere classified 09/09/2022 No Yes L97.312 Non-pressure chronic ulcer of right ankle with fat layer exposed 09/09/2022 No Yes D40.81 Chronic diastolic (congestive) heart failure 09/09/2022 No Yes Inactive Problems Resolved Problems Electronic Signature(s) Signed: 09/09/2022 10:34:06 AM By: Worthy Keeler PA-C Entered By: Worthy Keeler on 09/09/2022 10:34:06 -------------------------------------------------------------------------------- Progress Note Details Patient Name: Date of Service: [name] [name] [name]  09/09/2022 9:45 A M Medical Record Number: [id number] Patient Account Number: [id number] Date of Birth/Sex: Treating RN: 04-28-1925 (88 y.o. [place] Primary Care Provider: [name], [name] Other Clinician: Referring Provider: Treating Provider/Extender: [name] [name], [name]: 0 Subjective Chief Complaint Information obtained from Patient AAVYA, SHAFER ([id number]) [id number] Page 5 of 8 Right Lateral Ankle Ulcer History of Present Illness (HPI) ADMISSION 11/09/2021; this is a 88 year old woman who was accompanied by her caregiver who is her granddaughter. She has had a wound on her medial right calf since the beginning of December. Not exactly clear how this started. This was recently seen by palliative care and I think referred here. She has [facility] home health they have been using Vaseline gauze and her own stockings. She has chronic lymphedema and has had this for a number of years she has thigh-high compression stockings. She also has an appointment with Dr. Delana Meyer on Monday at [facility] vein and vascular. She was recently hospitalized for acute on chronic diastolic heart failure and currently is getting IV Lasix as an outpatient 5 days a week. According to granddaughter her weight is come down nicely. Past medical history includes type 2 diabetes with peripheral neuropathy, hypertension, chronic diastolic congestive heart failure, stage IV chronic kidney disease ABI was very difficult to get in our clinic but we got a very low value of 0.48. The patient does not easily complain of claudication or pain in the leg 11/16/2021 upon evaluation today patient appears to be doing well currently in regard to her wounds. In fact the wound appears to be completely healed which is great news. She does want to see about a referral to the lymphedema clinic. Readmission: 09-10-2022 upon evaluation today patient's wound appears to be a  blister on the lateral aspect of her right ankle which has been present currently for a couple of weeks. She could not get in here as quickly as they would like so she did go to see podiatry and they have taken pretty good care of this they ruptured the blister and had for in a Unna boot wrap. With that being said they have been using Betadine over top of that. I do believe she may be better with the Xeroform gauze dressing to be honest I think this will not stick and will actually overall doing much better for her. Patient's family members in agreement with  that plan. Otherwise her past medical history really has not changed significantly. She is currently under the care of hospice. Patient History Information obtained from Caregiver. Allergies lisinopril (Severity: Moderate), prednisone (Severity: Moderate), codeine (Severity: Severe, Reaction: swelling) Social History Never smoker, Marital Status - Widowed, Alcohol Use - Never, Drug Use - No History, Caffeine Use - Moderate. Medical History Eyes Patient has history of Cataracts - hx surgery Respiratory Patient has history of Sleep Apnea - non compliant CPAP Cardiovascular Patient has history of Congestive Heart Failure - extensive/BNP 6, Coronary Artery Disease, Hypertension Endocrine Patient has history of Type II Diabetes - dx noted Genitourinary Patient has history of End Stage Renal Disease Integumentary (Skin) Denies history of History of pressure wounds Musculoskeletal Patient has history of Osteoarthritis Denies history of Gout Neurologic Patient has history of Dementia, Neuropathy Denies history of Seizure Disorder Review of Systems (ROS) Genitourinary Complains or has symptoms of Kidney failure/ Dialysis. Integumentary (Skin) Complains or has symptoms of Wounds. Objective Constitutional patient is hypertensive.. pulse regular and within target range for patient.Marland Kitchen respirations regular, non-labored and within target  range for patient.Marland Kitchen temperature within target range for patient.. Obese and well-hydrated in no acute distress. Vitals Time Taken: 8:47 AM, Height: 60 in, Weight: 180 lbs, BMI: 35.2, Temperature: 97.5 F, Pulse: 73 bpm, Respiratory Rate: 16 breaths/min, Blood Pressure: 168/79 mmHg. Eyes conjunctiva clear no eyelid edema noted. pupils equal round and reactive to light and accommodation. Ears, Nose, Mouth, and Throat no gross abnormality of ear auricles or external auditory canals. normal hearing noted during conversation. mucus membranes moist. Respiratory Turnbaugh, Nishat ([id number]) [id number] Page 6 of 8 normal breathing without difficulty. Cardiovascular 1+ dorsalis pedis/posterior tibialis pulses. 4+ pitting edema of the bilateral lower extremities. Musculoskeletal Patient unable to walk without assistance. Psychiatric this patient is able to make decisions and demonstrates good insight into disease process. Alert and Oriented x 3. pleasant and cooperative. General Notes: Upon inspection patient's wound bed actually showed signs again of a ruptured blister there was one small area where this appears to be a little bit deeper there was some slough so this does appear to be full-thickness but nonetheless overall is not doing too poorly. She does seem to be tolerating the dressing changes without complication and in general and extremely pleased with where we stand in her leg is very swollen but we have to be careful with compressing due to the fact that she does have congestive heart failure and issues currently with fluid retention causing issues as far as her breathing is concerned. I think for that reason that she probably is going to need to be minimally compressed if anything so that we do not overload her. Integumentary (Hair, Skin) Wound #2 status is Open. Original cause of wound was Blister. The date acquired was: 08/26/2022. The wound is located on the  Right,Lateral Malleolus. The wound measures 3.8cm length x 4cm width x 0.1cm depth; 11.938cm^2 area and 1.194cm^3 volume. There is Fat Layer (Subcutaneous Tissue) exposed. There is no tunneling or undermining noted. There is a large amount of serous drainage noted. The wound margin is flat and intact. There is large (67-100%) red, friable granulation within the wound bed. There is a small (1-33%) amount of necrotic tissue within the wound bed including Adherent Slough. Assessment Active Problems ICD-10 Lymphedema, not elsewhere classified Non-pressure chronic ulcer of right ankle with fat layer exposed Chronic diastolic (congestive) heart failure Plan Follow-up Appointments: Return Appointment in 3 weeks. Nurse Visit as needed Home Health: Home  Health Company: - [place] (Hospice) [facility] for wound care. May utilize formulary equivalent dressing for wound treatment orders unless otherwise specified. Home Health Nurse may visit PRN to address patientoos wound care needs. Scheduled days for dressing changes to be completed; exception, patient has scheduled wound care visit that day. **Please direct any NON-WOUND related issues/requests for orders to patient's Primary Care Physician. **If current dressing causes regression in wound condition, may D/C ordered dressing product/s and apply Normal Saline Moist Dressing daily until next [place] or Other MD appointment. **Notify Wound Healing Center of regression in wound condition at [phone]. Bathing/ Shower/ Hygiene: May shower; gently cleanse wound with antibacterial soap, rinse and pat dry prior to dressing wounds WOUND #2: - Malleolus Wound Laterality: Right, Lateral Cleanser: Soap and Water (Home Health) 2 x Per Week/30 Days Discharge Instructions: Gently cleanse wound with antibacterial soap, rinse and pat dry prior to dressing wounds Prim Dressing: Xeroform 4x4-HBD (in/in) (Home Health) 2 x Per Week/30  Days ary Discharge Instructions: Apply Xeroform 4x4-HBD (in/in) as directed Secondary Dressing: ABD Pad 5x9 (in/in) (Home Health) 2 x Per Week/30 Days Discharge Instructions: Cover with ABD pad Secured With: Tubigrip Size D, 3x10 (in/yd) (Home Health) 2 x Per Week/30 Days 1. I would recommend initiation of treatment with a Xeroform gauze dressing followed by the ABD pad, roll gauze, and Tubigrip. I think this will be a minimal compression that will allow for some help with but not too much in order to overload as far as her lungs are concerned. 2. I am also can recommend at this time that we have the patient continue to be monitored for any signs of infection. She does have a hospice care coming out and also she has her family member who does help take care of her as well both of which I think can keep a close eye on things here which is great news. We will see patient back for reevaluation in 3 weeks here in the clinic. If anything worsens or changes patient will contact our office for additional recommendations. Electronic Signature(s) Signed: 09/09/2022 11:09:39 AM By: Worthy Keeler PA-C Entered By: Worthy Keeler on 09/09/2022 11:09:38 [name] ([id number]) [id number] Page 7 of 8 -------------------------------------------------------------------------------- ROS/PFSH Details Patient Name: Date of Service: [name] [name] 09/09/2022 9:45 A M Medical Record Number: [id number] Patient Account Number: [id number] Date of Birth/Sex: Treating RN: 10-11-1925 (88 y.o. [place]e [place]ores Primary Care Provider: [name], [name] Other Clinician: Referring Provider: Treating Provider/Extender: [name] [name], [name]: 0 Information Obtained From Caregiver Genitourinary Complaints and Symptoms: Positive for: Kidney failure/ Dialysis Medical History: Positive for: End Stage Renal Disease Integumentary (Skin) Complaints and  Symptoms: Positive for: Wounds Medical History: Negative for: History of pressure wounds Eyes Medical History: Positive for: Cataracts - hx surgery Respiratory Medical History: Positive for: Sleep Apnea - non compliant CPAP Cardiovascular Medical History: Positive for: Congestive Heart Failure - extensive/BNP 6; Coronary Artery Disease; Hypertension Endocrine Medical History: Positive for: Type II Diabetes - dx noted Time with diabetes: 2-3 years Blood sugar tested every day: No Musculoskeletal Medical History: Positive for: Osteoarthritis Negative for: Gout Neurologic Medical History: Positive for: Dementia; Neuropathy Negative for: Seizure Disorder HBO Extended History Items Eyes: Cataracts Sluka, Mayetta ([id number]) [id number] Page 8 of 8 Immunizations Pneumococcal Vaccine: Received Pneumococcal Vaccination: Yes Received Pneumococcal Vaccination On or After 60th Birthday: Yes Implantable Devices None Family and Social History Never smoker; Marital Status - Widowed; Alcohol Use: Never; Drug  Use: No History; Caffeine Use: Moderate; Financial Concerns: No; Food, Clothing or Shelter Needs: No; Support System Lacking: No; Transportation Concerns: No Engineer, maintenance) Signed: 09/09/2022 1:46:28 PM By: Gretta Cool, BSN, RN, CWS, Kim RN, BSN Signed: 09/10/2022 8:14:16 AM By: Worthy Keeler PA-C Entered By: Gretta Cool, BSN, RN, CWS, Kim on 09/09/2022 10:23:41 -------------------------------------------------------------------------------- SuperBill Details Patient Name: Date of Service: [name] [name] [facility] [name] 09/09/2022 Medical Record Number: [id number] Patient Account Number: [id number] Date of Birth/Sex: Treating RN: 04/29/1925 (88 y.o. [place] Primary Care Provider: [name], [name] Other Clinician: Referring Provider: Treating Provider/Extender: [name] [name], [name]: 0 Diagnosis Coding ICD-10 Codes Code  Description I89.0 Lymphedema, not elsewhere classified A02.984 Non-pressure chronic ulcer of right ankle with fat layer exposed R30.85 Chronic diastolic (congestive) heart failure Physician Procedures : CPT4 Code Description Modifier [id number] - WC PHYS LEVEL 3 - EST PT ICD-10 Diagnosis Description I89.0 Lymphedema, not elsewhere classified L97.312 Non-pressure chronic ulcer of right ankle with fat layer exposed G28.90 Chronic diastolic  (congestive) heart failure Quantity: 1 Electronic Signature(s) Signed: 09/09/2022 11:10:36 AM By: Worthy Keeler PA-C Entered By: Worthy Keeler on 09/09/2022 11:10:36

## 2022-09-30 ENCOUNTER — Encounter: Payer: Medicare Other | Attending: Physician Assistant | Admitting: Physician Assistant

## 2022-09-30 DIAGNOSIS — M1990 Unspecified osteoarthritis, unspecified site: Secondary | ICD-10-CM | POA: Insufficient documentation

## 2022-09-30 DIAGNOSIS — I2510 Atherosclerotic heart disease of native coronary artery without angina pectoris: Secondary | ICD-10-CM | POA: Insufficient documentation

## 2022-09-30 DIAGNOSIS — E1142 Type 2 diabetes mellitus with diabetic polyneuropathy: Secondary | ICD-10-CM | POA: Insufficient documentation

## 2022-09-30 DIAGNOSIS — I5032 Chronic diastolic (congestive) heart failure: Secondary | ICD-10-CM | POA: Insufficient documentation

## 2022-09-30 DIAGNOSIS — L97312 Non-pressure chronic ulcer of right ankle with fat layer exposed: Secondary | ICD-10-CM | POA: Insufficient documentation

## 2022-09-30 DIAGNOSIS — I132 Hypertensive heart and chronic kidney disease with heart failure and with stage 5 chronic kidney disease, or end stage renal disease: Secondary | ICD-10-CM | POA: Insufficient documentation

## 2022-09-30 DIAGNOSIS — F0390 Unspecified dementia without behavioral disturbance: Secondary | ICD-10-CM | POA: Diagnosis not present

## 2022-09-30 DIAGNOSIS — E11621 Type 2 diabetes mellitus with foot ulcer: Secondary | ICD-10-CM | POA: Diagnosis not present

## 2022-09-30 DIAGNOSIS — N186 End stage renal disease: Secondary | ICD-10-CM | POA: Insufficient documentation

## 2022-09-30 DIAGNOSIS — E1122 Type 2 diabetes mellitus with diabetic chronic kidney disease: Secondary | ICD-10-CM | POA: Diagnosis not present

## 2022-09-30 DIAGNOSIS — G4730 Sleep apnea, unspecified: Secondary | ICD-10-CM | POA: Insufficient documentation

## 2022-09-30 DIAGNOSIS — I890 Lymphedema, not elsewhere classified: Secondary | ICD-10-CM | POA: Diagnosis present

## 2022-09-30 NOTE — Progress Notes (Addendum)
[name] [name] ([id number]) [id number] Page 1 of 6 Visit Report for 09/30/2022 Chief Complaint Document Details Patient Name: Date of Service: [name] [name] 09/30/2022 11:30 A M Medical Record Number: [id number] Patient Account Number: [id number] Date of Birth/Sex: Treating RN: [birth year] (88 y.o. [name] [name] Primary Care Provider: [name], Mary [name] Other Clinician: Referring Provider: Treating Provider/Extender: [name] [name] [name] [name] Weeks in Treatment: 3 Information Obtained from: Patient Chief Complaint Right Lateral Ankle Ulcer Electronic Signature(s) Signed: 09/30/2022 11:21:52 AM By: [name] Keeler PA-C Entered By: [name] [name] on 09/30/2022 11:21:51 -------------------------------------------------------------------------------- HPI Details Patient Name: Date of Service: [name] [name] [name] [name] [name] 09/30/2022 11:30 A M Medical Record Number: [id number] Patient Account Number: [id number] Date of Birth/Sex: Treating RN: [birth year] (88 y.o. [name] [name] Primary Care Provider: [name], Mary [name] Other Clinician: Referring Provider: Treating Provider/Extender: [name] [name] [name] [name] Weeks in Treatment: 3 History of Present Illness HPI Description: ADMISSION 11/09/2021; this is a 88 year old woman who was accompanied by her caregiver who is her granddaughter. She has had a wound on her medial right calf since the beginning of December. Not exactly clear how this started. This was recently seen by palliative care and I think referred here. She has [name] home health they have been using Vaseline gauze and her own stockings. She has chronic lymphedema and has had this for a number of years she has thigh-high compression stockings. She also has an appointment with [name] [name] on Monday at [name]. She was recently hospitalized for acute on chronic diastolic heart failure and currently is getting IV Lasix as  an outpatient 5 days a week. According to granddaughter her weight is come down nicely. Past medical history includes type 2 diabetes with peripheral neuropathy, hypertension, chronic diastolic congestive heart failure, stage IV chronic kidney disease ABI was very difficult to get in our clinic but we got a very low value of 0.48. The patient does not easily complain of claudication or pain in the leg 11/16/2021 upon evaluation today patient appears to be doing well currently in regard to her wounds. In fact the wound appears to be completely healed which is great news. She does want to see about a referral to the lymphedema clinic. [name] [name] ([id number]) [id number] Page 2 of 6 Readmission: 09-10-2022 upon evaluation today patient's wound appears to be a blister on the lateral aspect of her right ankle which has been present currently for a couple of weeks. She could not get in here as quickly as they would like so she did go to see podiatry and they have taken pretty good care of this they ruptured the blister and had for in a Unna boot wrap. With that being said they have been using Betadine over top of that. I do believe she may be better with the Xeroform gauze dressing to be honest I think this will not stick and will actually overall doing much better for her. Patient's family members in agreement with that plan. Otherwise her past medical history really has not changed significantly. She is currently under the care of hospice. 09-30-2022 upon evaluation today patient appears to be doing well currently in regard to her wound this is actually measuring better which is great news. Fortunately I see no signs of infection at this time. Unfortunately the Tubigrip has not been used we did not have a wrap this week and get any ABIs on her due to the extreme pain. In general I think she is actually making some good progress  which is great news. Electronic  Signature(s) Signed: 09/30/2022 1:03:49 PM By: [name] Keeler PA-C Entered By: [name] [name] on 09/30/2022 13:03:49 -------------------------------------------------------------------------------- Physical Exam Details Patient Name: Date of Service: [name] [name] [name] 09/30/2022 11:30 A M Medical Record Number: [id number] Patient Account Number: [id number] Date of Birth/Sex: Treating RN: 1924-11-09 (88 y.o. [name] [name] Primary Care Provider: [name], Mary [name] Other Clinician: Referring Provider: Treating Provider/Extender: [name] [name] [name] [name] Weeks in Treatment: 3 Constitutional Well-nourished and well-hydrated in no acute distress. Respiratory normal breathing without difficulty. Psychiatric this patient is able to make decisions and demonstrates good insight into disease process. Alert and Oriented x 3. pleasant and cooperative. Notes Upon inspection patient's wound is actually showing signs of doing quite well. Again she does not have the Tubigrip in place which means she is a little bit more swollen again we have a compression wrap to her due to not being able to confirm ABIs but in general I think she is doing well and actually seems to be very close to resolution in regard to the wound. Again she is under the care of hospice at this point. Electronic Signature(s) Signed: 09/30/2022 1:05:02 PM By: [name] Keeler PA-C Entered By: [name] [name] on 09/30/2022 13:05:02 -------------------------------------------------------------------------------- Physician Orders Details Patient Name: Date of Service: [name] [name] [place] [name] 09/30/2022 11:30 A M Medical Record Number: [id number] Patient Account Number: [id number] Date of Birth/Sex: Treating RN: Apr 07, 1925 (88 y.o. [name] [name] Primary Care Provider: [name], Ignatius [name] Clinician: Lamar [name] ([id number]) [id number] Page 3 of 6 Referring Provider: Treating Provider/Extender:  [name] [name] [name] [name] Weeks in Treatment: 3 Verbal / Phone Orders: No Diagnosis Coding ICD-10 Coding Code Description I89.0 Lymphedema, not elsewhere classified L97.312 Non-pressure chronic ulcer of right ankle with fat layer exposed H74.16 Chronic diastolic (congestive) heart failure Follow-up Appointments Return Appointment in 3 weeks. Nurse Visit as needed [name] for wound care. May utilize formulary equivalent dressing for wound treatment orders unless otherwise specified. Home Health Nurse may visit PRN to address patients wound care needs. Scheduled days for dressing changes to be completed; exception, patient has scheduled wound care visit that day. **Please direct any NON-WOUND related issues/requests for orders to patient's Primary Care Physician. **If current dressing causes regression in wound condition, may D/C ordered dressing product/s and apply Normal Saline Moist Dressing daily until next [place] or Other MD appointment. **Notify [facility] of regression in wound condition at [phone]. Bathing/ Shower/ Hygiene May shower; gently cleanse wound with antibacterial soap, rinse and pat dry prior to dressing wounds Wound Treatment Wound #2 - Malleolus Wound Laterality: Right, Lateral Cleanser: Soap and Water ([place]) 2 x Per Week/30 Days Discharge Instructions: Gently cleanse wound with antibacterial soap, rinse and pat dry prior to dressing wounds Prim Dressing: Xeroform 4x4-HBD (in/in) ([place]) 2 x Per Week/30 Days ary Discharge Instructions: Apply Xeroform 4x4-HBD (in/in) as directed Secondary Dressing: ABD Pad 5x9 (in/in) ([place]) 2 x Per Week/30 Days Discharge Instructions: Cover with ABD pad Secured With: Tubigrip Size D, 3x10 (in/yd) ([place]) 2 x Per Week/30 Days Electronic Signature(s) Signed: 09/30/2022 1:22:59 PM By: [name] Keeler  PA-C Signed: 09/30/2022 3:51:57 PM By: Rosalio Loud MSN RN CNS WTA Entered By: Rosalio Loud on 09/30/2022 12:03:57 -------------------------------------------------------------------------------- Problem List Details Patient Name: Date of Service: [name] [name] [name] 09/30/2022 11:30 A M Medical Record Number: [id number] Patient Account Number: [id number] Date of Birth/Sex:  Treating RN: [birth year] (88 y.o. [name] [name] Primary Care Provider: [name], Mary [name] Other Clinician: Referring Provider: Treating Provider/Extender: [name] [name] [name], Sherryll Burger in Treatment: 3 Active Problems ICD-10 Encounter Code Description Active Date MDM [name] [name], [name] [name] ([id number]) [id number] Page 4 of 6 Code Description Active Date MDM Diagnosis I89.0 Lymphedema, not elsewhere classified 09/09/2022 No Yes L97.312 Non-pressure chronic ulcer of right ankle with fat layer exposed 09/09/2022 No Yes C78.93 Chronic diastolic (congestive) heart failure 09/09/2022 No Yes Inactive Problems Resolved Problems Electronic Signature(s) Signed: 09/30/2022 11:21:48 AM By: [name] Keeler PA-C Entered By: [name] [name] on 09/30/2022 11:21:48 -------------------------------------------------------------------------------- Progress Note Details Patient Name: Date of Service: [name] [name] [facility] [name] 09/30/2022 11:30 A M Medical Record Number: [id number] Patient Account Number: [id number] Date of Birth/Sex: Treating RN: [birth year] (88 y.o. [name] [name] Primary Care Provider: [name], Mary [name] Other Clinician: Referring Provider: Treating Provider/Extender: [name] [name] [name] [name] Weeks in Treatment: 3 Subjective Chief Complaint Information obtained from Patient Right Lateral Ankle Ulcer History of Present Illness (HPI) ADMISSION 11/09/2021; this is a 88 year old woman who was accompanied by her caregiver who is her granddaughter. She has had a wound on her medial right calf  since the beginning of December. Not exactly clear how this started. This was recently seen by palliative care and I think referred here. She has [facility] home health they have been using Vaseline gauze and her own stockings. She has chronic lymphedema and has had this for a number of years she has thigh-high compression stockings. She also has an appointment with [name] [name] on Monday at [name]. She was recently hospitalized for acute on chronic diastolic heart failure and currently is getting IV Lasix as an outpatient 5 days a week. According to granddaughter her weight is come down nicely. Past medical history includes type 2 diabetes with peripheral neuropathy, hypertension, chronic diastolic congestive heart failure, stage IV chronic kidney disease ABI was very difficult to get in our clinic but we got a very low value of 0.48. The patient does not easily complain of claudication or pain in the leg 11/16/2021 upon evaluation today patient appears to be doing well currently in regard to her wounds. In fact the wound appears to be completely healed which is great news. She does want to see about a referral to the lymphedema clinic. Readmission: 09-10-2022 upon evaluation today patient's wound appears to be a blister on the lateral aspect of her right ankle which has been present currently for a couple of weeks. She could not get in here as quickly as they would like so she did go to see podiatry and they have taken pretty good care of this they ruptured the blister and had for in a Unna boot wrap. With that being said they have been using Betadine over top of that. I do believe she may be better with the Xeroform gauze dressing to be honest I think this will not stick and will actually overall doing much better for her. Patient's family members in agreement with that plan. Otherwise her past medical history really has not changed significantly. She is currently under the care  of hospice. 09-30-2022 upon evaluation today patient appears to be doing well currently in regard to her wound this is actually measuring better which is great news. Fortunately I see no signs of infection at this time. Unfortunately the Tubigrip has not been used we did not have a wrap this week and get any ABIs on her due to the extreme  pain. In general I think she is actually making some good progress which is great news. [name] [name], [name] [name] ([id number]) [id number] Page 5 of 6 Objective Constitutional Well-nourished and well-hydrated in no acute distress. Vitals Time Taken: 11:48 AM, Height: 60 in, Weight: 180 lbs, BMI: 35.2, Temperature: 97.5 F, Pulse: 80 bpm, Respiratory Rate: 18 breaths/min, Blood Pressure: 204/114 mmHg. Respiratory normal breathing without difficulty. Psychiatric this patient is able to make decisions and demonstrates good insight into disease process. Alert and Oriented x 3. pleasant and cooperative. General Notes: Upon inspection patient's wound is actually showing signs of doing quite well. Again she does not have the Tubigrip in place which means she is a little bit more swollen again we have a compression wrap to her due to not being able to confirm ABIs but in general I think she is doing well and actually seems to be very close to resolution in regard to the wound. Again she is under the care of hospice at this point. Integumentary (Hair, Skin) Wound #2 status is Open. Original cause of wound was Blister. The date acquired was: 08/26/2022. The wound has been in treatment 3 weeks. The wound is located on the Right,Lateral Malleolus. The wound measures 2.5cm length x 3cm width x 0.2cm depth; 5.89cm^2 area and 1.178cm^3 volume. There is Fat Layer (Subcutaneous Tissue) exposed. There is a large amount of serous drainage noted. The wound margin is flat and intact. There is large (67-100%) red, friable granulation within the wound bed. There is a  small (1-33%) amount of necrotic tissue within the wound bed including Adherent Slough. Wound #2 status is Open. Original cause of wound was Blister. The date acquired was: 08/26/2022. The wound has been in treatment 3 weeks. The wound is located on the Right,Lateral Malleolus. The wound measures 2.5cm length x 3cm width x 0.2cm depth; 5.89cm^2 area and 1.178cm^3 volume. There is Fat Layer (Subcutaneous Tissue) exposed. There is a large amount of serous drainage noted. There is medium (34-66%) red, pink granulation within the wound bed. Assessment Active Problems ICD-10 Lymphedema, not elsewhere classified Non-pressure chronic ulcer of right ankle with fat layer exposed Chronic diastolic (congestive) heart failure Plan Follow-up Appointments: Return Appointment in 3 weeks. Nurse Visit as needed Home Health: [place]: [facility] [place] for wound care. May utilize formulary equivalent dressing for wound treatment orders unless otherwise specified. Home Health Nurse may visit PRN to address patientoos wound care needs. Scheduled days for dressing changes to be completed; exception, patient has scheduled wound care visit that day. **Please direct any NON-WOUND related issues/requests for orders to patient's Primary Care Physician. **If current dressing causes regression in wound condition, may D/C ordered dressing product/s and apply Normal Saline Moist Dressing daily until next [place] or Other MD appointment. **Notify [facility] of regression in wound condition at [phone]. Bathing/ Shower/ Hygiene: May shower; gently cleanse wound with antibacterial soap, rinse and pat dry prior to dressing wounds WOUND #2: - Malleolus Wound Laterality: Right, Lateral Cleanser: Soap and Water (Home Health) 2 x Per Week/30 Days Discharge Instructions: Gently cleanse wound with antibacterial soap, rinse and pat dry prior to dressing wounds Prim  Dressing: Xeroform 4x4-HBD (in/in) (Home Health) 2 x Per Week/30 Days ary Discharge Instructions: Apply Xeroform 4x4-HBD (in/in) as directed Secondary Dressing: ABD Pad 5x9 (in/in) (Home Health) 2 x Per Week/30 Days Discharge Instructions: Cover with ABD pad Secured With: Tubigrip Size D, 3x10 (in/yd) (Home Health) 2 x Per Week/30 Days  1. I am going to recommend that we have the patient continue with the Tubigrip again to contact her nurse in order to ensure that they have the right stuff in order to continue with the appropriate compression therapy with Tubigrip. 2. I am also can recommend that we have the patient continue with the Xeroform gauze dressing followed by the ABD pad to cover. We will see patient back for reevaluation in 1 week here in the clinic. If anything worsens or changes patient will contact our office for additional recommendations. [name] [name], [name] [name] ([id number]) [id number] Page 6 of 6 Electronic Signature(s) Signed: 09/30/2022 1:06:32 PM By: [name] Keeler PA-C Entered By: [name] [name] on 09/30/2022 13:06:32 -------------------------------------------------------------------------------- SuperBill Details Patient Name: Date of Service: [name] [name] [name] [name] 09/30/2022 Medical Record Number: [id number] Patient Account Number: [id number] Date of Birth/Sex: Treating RN: 11/04/1924 (88 y.o. [name] [name] Primary Care Provider: [name], Mary [name] Other Clinician: Referring Provider: Treating Provider/Extender: [name] [name] [name] [name] Weeks in Treatment: 3 Diagnosis Coding ICD-10 Codes Code Description I89.0 Lymphedema, not elsewhere classified T03.546 Non-pressure chronic ulcer of right ankle with fat layer exposed F68.12 Chronic diastolic (congestive) heart failure Facility Procedures : CPT4 Code: [id number] Description: 99213 - WOUND CARE VISIT-LEV 3 EST PT Modifier: Quantity: 1 Physician Procedures : CPT4 Code Description  Modifier [id number] - WC PHYS LEVEL 3 - EST PT ICD-10 Diagnosis Description I89.0 Lymphedema, not elsewhere classified W46.659 Non-pressure chronic ulcer of right ankle with fat layer exposed D35.70 Chronic diastolic  (congestive) heart failure Quantity: 1 Electronic Signature(s) Signed: 09/30/2022 1:06:57 PM By: [name] Keeler PA-C Entered By: [name] [name] on 09/30/2022 13:06:57

## 2022-09-30 NOTE — Progress Notes (Addendum)
SLOAN, GALENTINE ([id number]) [id number] Page 1 of 10 Visit Report for 09/30/2022 Arrival Information Details Patient Name: Date of Service: [name] [name] 09/30/2022 11:30 A M Medical Record Number: [id number] Patient Account Number: [id number] Date of Birth/Sex: Treating RN: 06/02/1925 (88 y.o. [name] [name] Primary Care [name] [name]: [name], [name] Sella Other Clinician: Referring [name] [name]: Treating [name] [name]/Extender: [name] [name] [name], [name] [name] in Treatment: 3 Visit Information History Since Last Visit Added or deleted any medications: No Patient Arrived: Wheel Chair Any new allergies or adverse reactions: No Arrival Time: 11:37 Had a fall or experienced change in No Accompanied By: granddaughter activities of daily living that may affect Transfer Assistance: None risk of falls: Patient Identification Verified: Yes Signs or symptoms of abuse/neglect since last visito No Secondary Verification Process Completed: Yes Hospitalized since last visit: No Patient Requires Transmission-Based Precautions: No Pain Present Now: No Patient Has Alerts: Yes Patient Alerts: Type II Diabetic Not able to complete ABI [facility] Electronic Signature(s) Signed: 09/30/2022 3:51:57 PM By: [name] Loud MSN RN CNS WTA Entered By: [name] [name] on 09/30/2022 11:48:37 -------------------------------------------------------------------------------- Clinic Level of Care Assessment Details Patient Name: Date of Service: [name] [name] 09/30/2022 11:30 A M Medical Record Number: [id number] Patient Account Number: [id number] Date of Birth/Sex: Treating RN: [birth year] (88 y.o. [name] [name] Primary Care Catcher Dehoyos: [name], [name] Sella Other Clinician: Referring [name] [name]: Treating [name] [name]/Extender: [name] [name] [name], [name] [name] in Treatment: 3 Clinic Level of Care Assessment Items TOOL 4 Quantity Score X- 1 0 Use when only an EandM is performed on  FOLLOW-UP visit ASSESSMENTS - Nursing Assessment / Reassessment X- 1 10 Reassessment of Co-morbidities (includes updates in patient status) X- 1 5 Reassessment of Adherence to Treatment Plan ASSESSMENTS - Wound and Skin Assessment / Reassessment [name], [name] ([id number]) [id number] Page 2 of 10 X- 1 5 Simple Wound Assessment / Reassessment - one wound _0  - 0 Complex Wound Assessment / Reassessment - multiple wounds _1  - 0 Dermatologic / Skin Assessment (not related to wound area) ASSESSMENTS - Focused Assessment _2  - 0 Circumferential Edema Measurements - multi extremities _3  - 0 Nutritional Assessment / Counseling / Intervention _4  - 0 Lower Extremity Assessment (monofilament, tuning fork, pulses) _5  - 0 Peripheral Arterial Disease Assessment (using hand held doppler) ASSESSMENTS - Ostomy and/or Continence Assessment and Care _6  - 0 Incontinence Assessment and Management _7  - 0 Ostomy Care Assessment and Management (repouching, etc.) PROCESS - Coordination of Care X - Simple Patient / Family Education for ongoing care 1 15 _8  - 0 Complex (extensive) Patient / Family Education for ongoing care X- 1 10 Staff obtains Programmer, systems, Records, T Results / Process Orders est _9  - 0 Staff telephones HHA, Nursing Homes / Clarify orders / etc _10  - 0 Routine Transfer to another Facility (non-emergent condition) _11  - 0 Routine Hospital Admission (non-emergent condition) _12  - 0 New Admissions / Biomedical engineer / Ordering NPWT Apligraf, etc. , _13  - 0 Emergency Hospital Admission (emergent condition) X- 1 10 Simple Discharge Coordination _14  - 0 Complex (extensive) Discharge Coordination PROCESS - Special Needs _15  - 0 Pediatric / Minor Patient Management _16  - 0 Isolation Patient Management _17  - 0 Hearing / Language / Visual special needs _18  - 0 Assessment of Community assistance (transportation, D/C planning, etc.) _19  - 0 Additional  assistance / Altered mentation _20  - 0 Support Surface(s) Assessment (bed, cushion, seat, etc.) INTERVENTIONS - Wound Cleansing / Measurement X - Simple Wound Cleansing - one wound 1 5 _21  - 0 Complex Wound Cleansing -  multiple wounds X- 1 5 Wound Imaging (photographs - any number of wounds) _0  - 0 Wound Tracing (instead of photographs) X- 1 5 Simple Wound Measurement - one wound _1  - 0 Complex Wound Measurement - multiple wounds INTERVENTIONS - Wound Dressings X - Small Wound Dressing one or multiple wounds 1 10 _2  - 0 Medium Wound Dressing one or multiple wounds _3  - 0 Large Wound Dressing one or multiple wounds <DUKGURKYHCWCBJSE>_8<\/BTDVVOHYWVPXTGGY>_6  - 0 Application of Medications - topical <RSWNIOEVOJJKKXFG>_1<\/WEXHBZJIRCVELFYB>_0  - 0 Application of Medications - injection INTERVENTIONS - Miscellaneous _6  - 0 External ear exam _7  - 0 Specimen Collection (cultures, biopsies, blood, body fluids, etc.) [name], [name] ([id number]) [id number] Page 3 of 10 _8  - 0 Specimen(s) / Culture(s) sent or taken to Lab for analysis _9  - 0 Patient Transfer (multiple staff / Harrel Lemon Lift / Similar devices) _10  - 0 Simple Staple / Suture removal (25 or less) _11  - 0 Complex Staple / Suture removal (26 or more) _12  - 0 Hypo / Hyperglycemic Management (close monitor of Blood Glucose) _13  - 0 Ankle / Brachial Index (ABI) - do not check if billed separately X- 1 5 Vital Signs Has the patient been seen at the hospital within the last three years: Yes Total Score: 85 Level Of Care: New/Established - Level 3 Electronic Signature(s) Signed: 09/30/2022 3:51:57 PM By: [name] Loud MSN RN CNS WTA Entered By: [name] [name] on 09/30/2022 12:04:58 -------------------------------------------------------------------------------- Encounter Discharge Information Details Patient Name: Date of Service: [name] [name] 09/30/2022 11:30 A M Medical Record Number: [id number] Patient Account Number: [id number] Date of Birth/Sex: Treating RN: [birth year] (88  y.o. [name] [name] Primary Care Reinhard Schack: [name], [name] Sella Other Clinician: Referring Sorayah Schrodt: Treating Daphna Lafuente/Extender: [name] [name] [name], [name] [name] in Treatment: 3 Encounter Discharge Information Items Discharge Condition: Stable Ambulatory Status: Wheelchair Discharge Destination: Home Transportation: Private Auto Accompanied By: granddaughter Schedule Follow-up Appointment: Yes Clinical Summary of Care: Electronic Signature(s) Signed: 09/30/2022 12:48:47 PM By: [name] Loud MSN RN CNS WTA Previous Signature: 09/30/2022 99:83:38 PM Version By: [name] Loud MSN RN CNS WTA Entered By: [name] [name] on 09/30/2022 12:48:47 -------------------------------------------------------------------------------- Lower Extremity Assessment Details Patient Name: Date of Service: [name] [name] 09/30/2022 11:30 A Brunetta Genera ([id number]) [id number] Page 4 of 10 Medical Record Number: [id number] Patient Account Number: [id number] Date of Birth/Sex: Treating RN: 24-Apr-1925 (88 y.o. [name] [name] Primary Care Zaryia Markel: [name], [name] Sella Other Clinician: Referring Kielan Dreisbach: Treating Bryden Darden/Extender: [name] [name] [name], [name] [name] in Treatment: 3 Edema Assessment Assessed: [Left: No] [Right: No] [Left: Edema] [Right: :] Calf Left: Right: Point of Measurement: 24 cm From Medial Instep 49 cm Ankle Left: Right: Point of Measurement: 12 cm From Medial Instep 35.5 cm Vascular Assessment Pulses: Dorsalis Pedis Palpable: [Right:Yes] Electronic Signature(s) Signed: 09/30/2022 3:51:57 PM By: [name] Loud MSN RN CNS WTA Entered By: [name] [name] on 09/30/2022 11:59:31 -------------------------------------------------------------------------------- Multi Wound Chart Details Patient Name: Date of Service: [name] [name] 09/30/2022 11:30 A M Medical Record Number: [id number] Patient Account Number: [id number] Date of Birth/Sex: Treating  RN: 01-02-1925 (88 y.o. [name] [name] Primary Care Laelani Vasko: [name], [name] Sella Other Clinician: Referring Rorey Bisson: Treating Breeann Reposa/Extender: [name] [name] [name], [name] [name] in Treatment: 3 Vital Signs Height(in): 60 Pulse(bpm): 80 Weight(lbs): 180 Blood Pressure(mmHg): 204/114 Body Mass Index(BMI): 35.2 Temperature(F): 97.5 Respiratory Rate(breaths/min): 18 [2:Photos:] [N/A:N/A] Right, Lateral Malleolus Right, Lateral Malleolus N/A Wound Location: Blister Blister N/A Wounding Event: Diabetic Wound/Ulcer of the Lower Diabetic Wound/Ulcer of the Lower N/A Primary EtiologyMARQUISHA, NIKOLOV ([id number]) [id number] Page 5 of  10 Extremity Extremity Cataracts, Sleep Apnea, Congestive Cataracts, Sleep Apnea, Congestive N/A Comorbid History: Heart Failure, Coronary Artery Heart Failure, Coronary Artery Disease, Hypertension, Type II Disease, Hypertension, Type II Diabetes, End Stage Renal Disease, Diabetes, End Stage Renal Disease, Osteoarthritis, Dementia, Neuropathy Osteoarthritis, Dementia, Neuropathy 08/26/2022 08/26/2022 N/A Date Acquired: 3 3 N/A [name] of Treatment: Open Open N/A Wound Status: No No N/A Wound Recurrence: 2.5x3x0.2 2.5x3x0.2 N/A Measurements L x W x D (cm) 5.89 5.89 N/A A (cm) : rea 1.178 1.178 N/A Volume (cm) : 50.70% 50.70% N/A % Reduction in A rea: 1.30% 1.30% N/A % Reduction in Volume: Grade 1 Grade 1 N/A Classification: Large Large N/A Exudate A mount: Serous Serous N/A Exudate Type: amber amber N/A Exudate Color: Flat and Intact N/A N/A Wound Margin: Large (67-100%) Medium (34-66%) N/A Granulation A mount: Red, Friable Red, Pink N/A Granulation Quality: Small (1-33%) N/A N/A Necrotic A mount: Fat Layer (Subcutaneous Tissue): Yes Fat Layer (Subcutaneous Tissue): Yes N/A Exposed Structures: Fascia: No Fascia: No Tendon: No Tendon: No Muscle: No Muscle: No Joint: No Joint: No Bone:  No Bone: No None N/A N/A Epithelialization: Treatment Notes Electronic Signature(s) Signed: 09/30/2022 3:51:57 PM By: [name] Loud MSN RN CNS WTA Entered By: [name] [name] on 09/30/2022 11:59:39 -------------------------------------------------------------------------------- Multi-Disciplinary Care Plan Details Patient Name: Date of Service: [name] [name], [name] [name] 09/30/2022 11:30 A M Medical Record Number: [id number] Patient Account Number: [id number] Date of Birth/Sex: Treating RN: [birth year] (88 y.o. [name] [name] Primary Care Jessicca Stitzer: [name], [name] Sella Other Clinician: Referring Nyasha Rahilly: Treating Mataya Kilduff/Extender: [name] [name] [name], [name] [name] in Treatment: 3 Active Inactive Abuse / Safety / Falls / Self Care Management Nursing Diagnoses: Potential for falls Goals: Patient/caregiver will verbalize understanding of skin care regimen Date Initiated: 09/09/2022 Target Resolution Date: 09/09/2022 Goal Status: Active Interventions: Assess self care needs on admission and as needed Notes: SUNDEEP, CARY ([id number]) [id number] Page 6 of 10 Orientation to the Wound Care Program Nursing Diagnoses: Knowledge deficit related to the wound healing [name] program Goals: Patient/caregiver will verbalize understanding of the [place] Program Date Initiated: 09/09/2022 Target Resolution Date: 09/09/2022 Goal Status: Active Interventions: Provide education on orientation to the wound [name] Notes: Pain, Acute or Chronic Nursing Diagnoses: Pain, acute or chronic: actual or potential Goals: Patient/caregiver will verbalize comfort level met Date Initiated: 09/09/2022 Target Resolution Date: 09/09/2022 Goal Status: Active Interventions: Assess comfort goal upon admission Treatment Activities: Administer pain control measures as ordered : 09/09/2022 Refer to pain specialist or other pain support program : 09/09/2022 Notes: Wound/Skin  Impairment Nursing Diagnoses: Impaired tissue integrity Goals: Patient/caregiver will verbalize understanding of skin care regimen Date Initiated: 09/09/2022 Target Resolution Date: 09/09/2022 Goal Status: Active Ulcer/skin breakdown will have a volume reduction of 30% by week 4 Date Initiated: 09/09/2022 Target Resolution Date: 09/23/2022 Goal Status: Active Interventions: Assess ulceration(s) every visit Treatment Activities: Skin care regimen initiated : 09/09/2022 Topical wound management initiated : 09/09/2022 Notes: Electronic Signature(s) Signed: 09/30/2022 3:51:57 PM By: [name] Loud MSN RN CNS WTA Entered By: [name] [name] on 09/30/2022 12:05:28 -------------------------------------------------------------------------------- Pain Assessment Details Patient Name: Date of Service: [name] [facility] [name] 09/30/2022 11:30 [place] Number: [id number] Patient Account Number: [id number] CLEVIE, PROUT ([id number]) [phone].pdf Page 7 of 10 Date of Birth/Sex: Treating RN: 1924-12-25 (88 y.o. [name] [name] Primary Care Malie Kashani: Other Clinician: [name], [name] Sella Referring Azalya Galyon: Treating Alonnie Bieker/Extender: [name] [name] [name], [name] [name] in Treatment: 3 Active Problems Location of Pain Severity and Description of Pain Patient Has Paino Yes Site Locations  Rate the pain. Current Pain Level: 7 Worst Pain Level: 10 Least Pain Level: 5 Tolerable Pain Level: 4 Character of Pain Describe the Pain: Dull, Heavy, Shooting, Throbbing Pain Management and Medication Current Pain Management: Electronic Signature(s) Signed: 09/30/2022 3:51:57 PM By: [name] Loud MSN RN CNS WTA Entered By: [name] [name] on 09/30/2022 11:51:30 -------------------------------------------------------------------------------- Patient/Caregiver Education Details Patient Name: Date of Service: [name] [name] 12/11/2023andnbsp11:30 A M Medical Record Number:  [id number] Patient Account Number: [id number] Date of Birth/Gender: Treating RN: Feb 26, 1925 (88 y.o. [name] [name] Primary Care Physician: [name], [name] Sella Other Clinician: Referring Physician: Treating Physician/Extender: [name] [name] [name], [name] [name] in Treatment: 3 Education Assessment Education Provided To: Patient and Caregiver Education Topics Provided Wound/Skin Impairment: Handouts: Caring for Your Ulcer Methods: Explain/Verbal Responses: State content correctly [name] [name] ([id number]) [phone].pdf Page 8 of 10 Electronic Signature(s) Signed: 09/30/2022 3:51:57 PM By: [name] Loud MSN RN CNS WTA Entered By: [name] [name] on 09/30/2022 12:05:23 -------------------------------------------------------------------------------- Wound Assessment Details Patient Name: Date of Service: [name] [name] 09/30/2022 11:30 A M Medical Record Number: [id number] Patient Account Number: [id number] Date of Birth/Sex: Treating RN: Aug 07, 1925 (88 y.o. [name] [name] Primary Care Zacarias Krauter: [name], [name] Sella Other Clinician: Referring Santi Troung: Treating Katalea Ucci/Extender: [name] [name] [name], [name] [name] in Treatment: 3 Wound Status Wound Number: 2 Primary Diabetic Wound/Ulcer of the Lower Extremity Etiology: Wound Location: Right, Lateral Malleolus Wound Open Wounding Event: Blister Status: Date Acquired: 08/26/2022 Comorbid Cataracts, Sleep Apnea, Congestive Heart Failure, Coronary Artery [name] Of Treatment: 3 History: Disease, Hypertension, Type II Diabetes, End Stage Renal Clustered Wound: No Disease, Osteoarthritis, Dementia, Neuropathy Photos Wound Measurements Length: (cm) 2.5 Width: (cm) 3 Depth: (cm) 0.2 Area: (cm) 5.89 Volume: (cm) 1.178 % Reduction in Area: 50.7% % Reduction in Volume: 1.3% Epithelialization: None Wound Description Classification: Grade 1 Wound Margin: Flat and Intact Exudate Amount: Large Exudate Type:  Serous Exudate Color: amber Foul Odor After Cleansing: No Slough/Fibrino Yes Wound Bed Granulation Amount: Large (67-100%) Exposed Structure Granulation Quality: Red, Friable Fascia Exposed: No Necrotic Amount: Small (1-33%) Fat Layer (Subcutaneous Tissue) Exposed: Yes Necrotic Quality: Adherent Slough Tendon Exposed: No Muscle Exposed: No Joint Exposed: No Bone Exposed: No [name] [name], [name] [name] ([id number]) [id number] Page 9 of 10 Electronic Signature(s) Signed: 09/30/2022 3:51:57 PM By: [name] Loud MSN RN CNS WTA Entered By: [name] [name] on 09/30/2022 11:55:29 -------------------------------------------------------------------------------- Wound Assessment Details Patient Name: Date of Service: [name] [name] 09/30/2022 11:30 A M Medical Record Number: [id number] Patient Account Number: [id number] Date of Birth/Sex: Treating RN: 01-12-1925 (88 y.o. [name] [name] Primary Care Rayshun Kandler: [name], [name] Sella Other Clinician: Referring Travus Oren: Treating Raevon Broom/Extender: [name] [name] [name], [name] [name] in Treatment: 3 Wound Status Wound Number: 2 Primary Diabetic Wound/Ulcer of the Lower Extremity Etiology: Wound Location: Right, Lateral Malleolus Wound Open Wounding Event: Blister Status: Date Acquired: 08/26/2022 Comorbid Cataracts, Sleep Apnea, Congestive Heart Failure, Coronary Artery [name] Of Treatment: 3 History: Disease, Hypertension, Type II Diabetes, End Stage Renal Clustered Wound: No Disease, Osteoarthritis, Dementia, Neuropathy Photos Wound Measurements Length: (cm) 2.5 Width: (cm) 3 Depth: (cm) 0.2 Area: (cm) 5.89 Volume: (cm) 1.178 % Reduction in Area: 50.7% % Reduction in Volume: 1.3% Wound Description Classification: Exudate Amount: Exudate Type: Exudate Color: Grade 1 Large Serous amber Wound Bed Granulation Amount: Medium (34-66%) Exposed Structure Granulation Quality: Red, Pink Fascia Exposed: No Fat Layer  (Subcutaneous Tissue) Exposed: Yes Tendon Exposed: No Muscle Exposed: No Joint Exposed: No Bone Exposed: No Treatment Notes [name] [name], Kamrynn ([id number]) [id number] Page 10 of 10 Wound #2 (Malleolus)  Wound Laterality: Right, Lateral Cleanser Soap and Water Discharge Instruction: Gently cleanse wound with antibacterial soap, rinse and pat dry prior to dressing wounds Peri-Wound Care Topical Primary Dressing Xeroform 4x4-HBD (in/in) Discharge Instruction: Apply Xeroform 4x4-HBD (in/in) as directed Secondary Dressing ABD Pad 5x9 (in/in) Discharge Instruction: Cover with ABD pad Secured With Tubigrip Size D, 3x10 (in/yd) Compression Wrap Compression Stockings Add-Ons Electronic Signature(s) Signed: 09/30/2022 3:51:57 PM By: [name] Loud MSN RN CNS WTA Entered By: [name] [name] on 09/30/2022 11:56:56 -------------------------------------------------------------------------------- Vitals Details Patient Name: Date of Service: [name] [facility] [name] 09/30/2022 11:30 A M Medical Record Number: [id number] Patient Account Number: [id number] Date of Birth/Sex: Treating RN: December 23, 1924 (88 y.o. [name] [name] Primary Care Sharen Youngren: [name], [name] Sella Other Clinician: Referring Ingri Diemer: Treating Charo Philipp/Extender: [name] [name] [name], [name] [name] in Treatment: 3 Vital Signs Time Taken: 11:48 Temperature (F): 97.5 Height (in): 60 Pulse (bpm): 80 Weight (lbs): 180 Respiratory Rate (breaths/min): 18 Body Mass Index (BMI): 35.2 Blood Pressure (mmHg): 204/114 Reference Range: 80 - 120 mg / dl Electronic Signature(s) Signed: 09/30/2022 3:51:57 PM By: [name] Loud MSN RN CNS WTA Entered By: [name] [name] on 09/30/2022 11:51:09

## 2022-10-28 ENCOUNTER — Encounter: Attending: Physician Assistant | Admitting: Physician Assistant

## 2022-10-28 DIAGNOSIS — I5032 Chronic diastolic (congestive) heart failure: Secondary | ICD-10-CM | POA: Diagnosis not present

## 2022-10-28 DIAGNOSIS — E1122 Type 2 diabetes mellitus with diabetic chronic kidney disease: Secondary | ICD-10-CM | POA: Insufficient documentation

## 2022-10-28 DIAGNOSIS — Z09 Encounter for follow-up examination after completed treatment for conditions other than malignant neoplasm: Secondary | ICD-10-CM | POA: Diagnosis not present

## 2022-10-28 DIAGNOSIS — I890 Lymphedema, not elsewhere classified: Secondary | ICD-10-CM | POA: Diagnosis present

## 2022-10-28 DIAGNOSIS — E11622 Type 2 diabetes mellitus with other skin ulcer: Secondary | ICD-10-CM | POA: Insufficient documentation

## 2022-10-28 DIAGNOSIS — I132 Hypertensive heart and chronic kidney disease with heart failure and with stage 5 chronic kidney disease, or end stage renal disease: Secondary | ICD-10-CM | POA: Insufficient documentation

## 2022-10-28 DIAGNOSIS — L97312 Non-pressure chronic ulcer of right ankle with fat layer exposed: Secondary | ICD-10-CM | POA: Insufficient documentation

## 2022-10-28 NOTE — Progress Notes (Signed)
[name] [name], LUVIANO ([id number]) [id number] Page 1 of 8 Visit Report for 10/28/2022 Arrival Information Details Patient Name: Date of Service: [name] [name] [facility] 10/28/2022 1:15 PM Medical Record Number: [id number] Patient Account Number: [id number] Date of Birth/Sex: Treating RN: 01/04/1925 (89 y.o. [name] Primary Care Annaelle Kasel: [name], [name] Other Clinician: Referring Clio Gerhart: Treating Heli Dino/Extender: [name] [name], [name]: 7 Visit Information History Since Last Visit Added or deleted any medications: No Patient Arrived: Wheel Chair Any new allergies or adverse reactions: No Arrival Time: 12:56 Had a fall or experienced change in No Accompanied By: caretaker activities of daily living that may affect Transfer Assistance: None risk of falls: Patient Identification Verified: Yes Hospitalized since last visit: No Secondary Verification Process Completed: Yes Has Dressing in Place as Prescribed: Yes Patient Requires Transmission-Based Precautions: No Has Compression in Place as Prescribed: No Patient Has Alerts: Yes Pain Present Now: No Patient Alerts: Type II Diabetic Not able to complete ABI DT Pain Hospice Care Electronic Signature(s) Unsigned Entered By: Rosalio Loud on 10/28/2022 13:01:24 -------------------------------------------------------------------------------- Clinic Level of Care Assessment Details Patient Name: Date of ServiceMarsa [name] 10/28/2022 1:15 PM Medical Record Number: [id number] Patient Account Number: [id number] Date of Birth/Sex: Treating RN: Mar 30, 1925 (89 y.o. [name] Primary Care Adalbert Alberto: [name], [name] Other Clinician: Referring Cearra Portnoy: Treating Draylon Mercadel/Extender: [name] [name], [name]: 7 Clinic Level of Care Assessment Items TOOL 4 Quantity Score X- 1 0 Use when only an EandM is performed on FOLLOW-UP visit ASSESSMENTS - Nursing  Assessment / Reassessment X- 1 10 Reassessment of Co-morbidities (includes updates in patient status) X- 1 5 Reassessment of Adherence to Treatment Plan Swiderski, [name] ([id number]) [id number] Page 2 of 8 ASSESSMENTS - Wound and Skin A ssessment / Reassessment X - Simple Wound Assessment / Reassessment - one wound 1 5 []  - 0 Complex Wound Assessment / Reassessment - multiple wounds []  - 0 Dermatologic / Skin Assessment (not related to wound area) ASSESSMENTS - Focused Assessment []  - 0 Circumferential Edema Measurements - multi extremities []  - 0 Nutritional Assessment / Counseling / Intervention []  - 0 Lower Extremity Assessment (monofilament, tuning fork, pulses) []  - 0 Peripheral Arterial Disease Assessment (using hand held doppler) ASSESSMENTS - Ostomy and/or Continence Assessment and Care []  - 0 Incontinence Assessment and Management []  - 0 Ostomy Care Assessment and Management (repouching, etc.) PROCESS - Coordination of Care X - Simple Patient / Family Education for ongoing care 1 15 []  - 0 Complex (extensive) Patient / Family Education for ongoing care X- 1 10 Staff obtains Programmer, systems, Records, T Results / Process Orders est []  - 0 Staff telephones HHA, Nursing Homes / Clarify orders / etc []  - 0 Routine Transfer to another Facility (non-emergent condition) []  - 0 Routine Hospital Admission (non-emergent condition) []  - 0 New Admissions / Biomedical engineer / Ordering NPWT Apligraf, etc. , []  - 0 Emergency Hospital Admission (emergent condition) X- 1 10 Simple Discharge Coordination []  - 0 Complex (extensive) Discharge Coordination PROCESS - Special Needs []  - 0 Pediatric / Minor Patient Management []  - 0 Isolation Patient Management []  - 0 Hearing / Language / Visual special needs []  - 0 Assessment of Community assistance (transportation, D/C planning, etc.) []  - 0 Additional assistance / Altered mentation []  -  0 Support Surface(s) Assessment (bed, cushion, seat, etc.) INTERVENTIONS - Wound Cleansing / Measurement X - Simple Wound Cleansing - one wound 1 5 []  - 0 Complex Wound Cleansing - multiple wounds X- 1  5 Wound Imaging (photographs - any number of wounds) []  - 0 Wound Tracing (instead of photographs) X- 1 5 Simple Wound Measurement - one wound []  - 0 Complex Wound Measurement - multiple wounds INTERVENTIONS - Wound Dressings X - Small Wound Dressing one or multiple wounds 1 10 []  - 0 Medium Wound Dressing one or multiple wounds []  - 0 Large Wound Dressing one or multiple wounds []  - 0 Application of Medications - topical []  - 0 Application of Medications - injection INTERVENTIONS - Miscellaneous []  - 0 External ear exam Howk, [name] ([id number]) [id number] Page 3 of 8 []  - 0 Specimen Collection (cultures, biopsies, blood, body fluids, etc.) []  - 0 Specimen(s) / Culture(s) sent or taken to Lab for analysis []  - 0 Patient Transfer (multiple staff / Harrel Lemon Lift / Similar devices) []  - 0 Simple Staple / Suture removal (25 or less) []  - 0 Complex Staple / Suture removal (26 or more) []  - 0 Hypo / Hyperglycemic Management (close monitor of Blood Glucose) []  - 0 Ankle / Brachial Index (ABI) - do not check if billed separately X- 1 5 Vital Signs Has the patient been seen at the hospital within the last three years: Yes Total Score: 85 Level Of Care: New/Established - Level 3 Electronic Signature(s) Unsigned Entered By: Rosalio Loud on 10/28/2022 13:13:26 -------------------------------------------------------------------------------- Encounter Discharge Information Details Patient Name: Date of Service: [name] [name] 10/28/2022 1:15 PM Medical Record Number: [id number] Patient Account Number: [id number] Date of Birth/Sex: Treating RN: Mar 06, 1925 (89 y.o. [name] Primary Care Jencarlo Bonadonna: [name], [name] Other Clinician: Referring  Sonna Lipsky: Treating Lia Vigilante/Extender: [name] [name], [name]: 7 Encounter Discharge Information Items Discharge Condition: Stable Ambulatory Status: Wheelchair Discharge Destination: Home Transportation: Private Auto Accompanied By: cousin Schedule Follow-up Appointment: No Clinical Summary of Care: Electronic Signature(s) Unsigned Entered By: Rosalio Loud on 10/28/2022 13:16:32 Signature(s): [name] ([id number]) [id number] Date(s): [id number] Page 4 of 8 -------------------------------------------------------------------------------- Lower Extremity Assessment Details Patient Name: Date of Service: [name] [name] 10/28/2022 1:15 PM Medical Record Number: [id number] Patient Account Number: [id number] Date of Birth/Sex: Treating RN: 1925-03-08 (89 y.o. [name] Primary Care Jarryn Altland: [name], [name] Other Clinician: Referring Becker Christopher: Treating Oanh Devivo/Extender: [name] [name], [name]: 7 Electronic Signature(s) Unsigned Entered By: Rosalio Loud on 10/28/2022 13:10:59 -------------------------------------------------------------------------------- Multi Wound Chart Details Patient Name: Date of Service: [name] [name] 10/28/2022 1:15 PM Medical Record Number: [id number] Patient Account Number: [id number] Date of Birth/Sex: Treating RN: May 05, 1925 (89 y.o. [name] Primary Care Layali Freund: [name], [name] Other Clinician: Referring Merlinda Wrubel: Treating Kadden Osterhout/Extender: [name] [name], [name]: 7 Vital Signs Height(in): 60 Pulse(bpm): 23 Weight(lbs): 180 Blood Pressure(mmHg): 180/84 Body Mass Index(BMI): 35.2 Temperature(F): 97.5 Respiratory Rate(breaths/min): 18 [2:Photos:] [N/A:N/A] Right, Lateral Malleolus N/A N/A Wound Location: Blister N/A N/A Wounding Event: Diabetic Wound/Ulcer of the Lower N/A N/A Primary Etiology: Extremity Cataracts,  Sleep Apnea, Congestive N/A N/A Comorbid History: Heart Failure, Coronary Artery Disease, Hypertension, Type II Diabetes, End Stage Renal Disease, Wilsey, Jessiah ([id number]) [id number] Page 5 of 8 Osteoarthritis, Dementia, Neuropathy 08/26/2022 N/A N/A Date Acquired: 7 N/A N/A Weeks of Treatment: Open N/A N/A Wound Status: No N/A N/A Wound Recurrence: 0x0x0 N/A N/A Measurements L x W x D (cm) 0 N/A N/A A (cm) : rea 0 N/A N/A Volume (cm) : 100.00% N/A N/A % Reduction in A rea: 100.00% N/A N/A % Reduction in Volume: Grade 1 N/A N/A Classification: Large N/A N/A Exudate A mount: Serous  N/A N/A Exudate Type: amber N/A N/A Exudate Color: Medium (34-66%) N/A N/A Granulation A mount: Red, Pink N/A N/A Granulation Quality: Fat Layer (Subcutaneous Tissue): Yes N/A N/A Exposed Structures: Fascia: No Tendon: No Muscle: No Joint: No Bone: No Treatment Notes Electronic Signature(s) Unsigned Entered By: Rosalio Loud on 10/28/2022 13:11:16 -------------------------------------------------------------------------------- Multi-Disciplinary Care Plan Details Patient Name: Date of Service: [name] [name] 10/28/2022 1:15 PM Medical Record Number: [id number] Patient Account Number: [id number] Date of Birth/Sex: Treating RN: 1925-03-16 (89 y.o. [name] Primary Care Kona Yusuf: [name], [name] Other Clinician: Referring Sunday Klos: Treating Jilliana Burkes/Extender: [name] [name], [name]: 7 Active Inactive Electronic Signature(s) Unsigned Entered By: Rosalio Loud on 10/28/2022 13:15:37 Signature(s): [name] ([id number]) [id number] Date(s): [id number] Page 6 of 8 -------------------------------------------------------------------------------- Pain Assessment Details Patient Name: Date of Service: [name] [name] 10/28/2022 1:15 PM Medical Record Number: [id number] Patient Account Number:  [id number] Date of Birth/Sex: Treating RN: 1925/10/12 (89 y.o. [name] Primary Care Lilyian Quayle: [name], [name] Other Clinician: Referring Floetta Brickey: Treating Nykeem Citro/Extender: [name] [name], [name]: 7 Active Problems Location of Pain Severity and Description of Pain Patient Has Paino No Site Locations Pain Management and Medication Current Pain Management: Electronic Signature(s) Unsigned Entered By: Rosalio Loud on 10/28/2022 13:05:34 -------------------------------------------------------------------------------- Patient/Caregiver Education Details Patient Name: Date of Service: [name] [name] 1/8/2024andnbsp1:15 PM Medical Record Number: [id number] Patient Account Number: [id number] Date of Birth/Gender: Treating RN: [birth year] (89 y.o. [name] Primary Care Physician: [name], [name] Other Clinician: Referring Physician: Treating Physician/Extender: [name] [name], Sherryll Burger in Treatment: [address], [name] ([id number]) [id number] Page 7 of 8 Education Assessment Education Provided To: Patient Education Topics Provided Offloading: Handouts: How Offloading Helps Foot Wounds Heal Methods: Explain/Verbal Responses: State content correctly Electronic Signature(s) Unsigned Entered By: Rosalio Loud on 10/28/2022 13:15:16 -------------------------------------------------------------------------------- Wound Assessment Details Patient Name: Date of Service: [name] [name] 10/28/2022 1:15 PM Medical Record Number: [id number] Patient Account Number: [id number] Date of Birth/Sex: Treating RN: 12-22-1924 (89 y.o. [name] Primary Care Senna Lape: [name], [name] Other Clinician: Referring Karsen Fellows: Treating Tayari Yankee/Extender: [name] [name], [name]: 7 Wound Status Wound Number: 2 Primary Diabetic Wound/Ulcer of the Lower Extremity Etiology: Wound Location:  Right, Lateral Malleolus Wound Open Wounding Event: Blister Status: Date Acquired: 08/26/2022 Comorbid Cataracts, Sleep Apnea, Congestive Heart Failure, Coronary Artery Weeks Of Treatment: 7 History: Disease, Hypertension, Type II Diabetes, End Stage Renal Clustered Wound: No Disease, Osteoarthritis, Dementia, Neuropathy Photos Wound Measurements Length: (cm) Width: (cm) Depth: (cm) Area: (cm) Volume: (cm) 0 % Reduction in Area: 100% 0 % Reduction in Volume: 100% 0 0 0 Wound Description Stickles, Koral ([id number]) Classification: Grade 1 Exudate Amount: Large Exudate Type: Serous Exudate Color: amber [id number] Page 8 of 8 Wound Bed Granulation Amount: Medium (34-66%) Exposed Structure Granulation Quality: Red, Pink Fascia Exposed: No Fat Layer (Subcutaneous Tissue) Exposed: Yes Tendon Exposed: No Muscle Exposed: No Joint Exposed: No Bone Exposed: No Electronic Signature(s) Unsigned Entered By: Rosalio Loud on 10/28/2022 13:07:23 -------------------------------------------------------------------------------- Vitals Details Patient Name: Date of Service: [name] [facility] [name] 10/28/2022 1:15 PM Medical Record Number: [id number] Patient Account Number: [id number] Date of Birth/Sex: Treating RN: July 30, 1925 (89 y.o. [name] Primary Care Harrol Novello: [name], [name] Other Clinician: Referring Karletta Millay: Treating Lisl Slingerland/Extender: [name] [name], [name]: 7 Vital Signs Time Taken: 13:01 Temperature (F): 97.5 Height (in): 60 Pulse (bpm): 64 Weight (lbs): 180 Respiratory Rate (breaths/min): 18 Body Mass Index (BMI): 35.2 Blood Pressure (mmHg): 180/84  Reference Range: 80 - 120 mg / dl Electronic Signature(s) Unsigned Entered By: Rosalio Loud on 10/28/2022 13:05:00 Signature(s): Date(s):

## 2022-10-28 NOTE — Progress Notes (Signed)
[name], [name] ([id number]) [id number] Page 1 of 6 Visit Report for 10/28/2022 Chief Complaint Document Details Patient Name: Date of Service: [name] [name] [facility] 10/28/2022 1:15 PM Medical Record Number: [id number] Patient Account Number: [id number] Date of Birth/Sex: Treating RN: 24-[name]-1926 (89 y.o. [name] [name] Primary Care Provider: [name], Mary [name] Other Clinician: Referring Provider: Treating Provider/Extender: [name] [name] [name] [name] [name] in Treatment: 7 Information Obtained from: Patient Chief Complaint Right Lateral Ankle Ulcer Electronic Signature(s) Signed: 10/28/2022 12:59:11 PM By: Worthy Keeler PA-C Entered By: Worthy Keeler on 10/28/2022 12:59:10 -------------------------------------------------------------------------------- HPI Details Patient Name: Date of Service: [name] [name] [name] 10/28/2022 1:15 PM Medical Record Number: [id number] Patient Account Number: [id number] Date of Birth/Sex: Treating RN: May 24, 1925 (89 y.o. [name] [name] Primary Care Provider: [name], Mary [name] Other Clinician: Referring Provider: Treating Provider/Extender: [name] [name] [name] [name] [name] in Treatment: 7 History of Present Illness HPI Description: ADMISSION 11/09/2021; this is a 89 year old woman who was accompanied by her caregiver who is her granddaughter. She has had a wound on her medial right calf since the beginning of December. Not exactly clear how this started. This was recently seen by palliative care and I think referred here. She has [facility] home health they have been using Vaseline gauze and her own stockings. She has chronic lymphedema and has had this for a number of years she has thigh-high compression stockings. She also has an appointment with Dr. Delana Meyer on Monday at [facility] vein and vascular. She was recently hospitalized for acute on chronic diastolic heart failure and currently is getting IV Lasix as an outpatient 5  days a week. According to granddaughter her weight is come down nicely. Past medical history includes type 2 diabetes with peripheral neuropathy, hypertension, chronic diastolic congestive heart failure, stage IV chronic kidney disease ABI was very difficult to get in our clinic but we got a very low value of 0.48. The patient does not easily complain of claudication or pain in the leg 11/16/2021 upon evaluation today patient appears to be doing well currently in regard to her wounds. In fact the wound appears to be completely healed which is great news. She does want to see about a referral to the lymphedema clinic. [name], [name] [id number] Page 2 of 6 Readmission: 09-10-2022 upon evaluation today patient's wound appears to be a blister on the lateral aspect of her right ankle which has been present currently for a couple of [name]. She could not get in here as quickly as they would like so she did go to see podiatry and they have taken pretty good care of this they ruptured the blister and had for in a Unna boot wrap. With that being said they have been using Betadine over top of that. I do believe she may be better with the Xeroform gauze dressing to be honest I think this will not stick and will actually overall doing much better for her. Patient's family members in agreement with that plan. Otherwise her past medical history really has not changed significantly. She is currently under the care of hospice. 09-30-2022 upon evaluation today patient appears to be doing well currently in regard to her wound this is actually measuring better which is great news. Fortunately I see no signs of infection at this time. Unfortunately the Tubigrip has not been used we did not have a wrap this week and get any ABIs on her due to the extreme pain. In general I think she is actually making some good progress which is  great news. 10-28-2022 upon evaluation today patient  appears to be doing excellent in fact she is completely healed based on what I am seeing. I see no signs of active infection at this time which is great news. No fevers, chills, nausea, vomiting, or diarrhea. Electronic Signature(s) Signed: 10/28/2022 1:12:22 PM By: Worthy Keeler PA-C Entered By: Worthy Keeler on 10/28/2022 13:12:21 -------------------------------------------------------------------------------- Physical Exam Details Patient Name: Date of Service: [name] [name] [name] 10/28/2022 1:15 PM Medical Record Number: [id number] Patient Account Number: [id number] Date of Birth/Sex: Treating RN: 02-09-1925 (89 y.o. [name] [name] Primary Care Provider: [name], Mary [name] Other Clinician: Referring Provider: Treating Provider/Extender: [name] [name] [name] [name] [name] in Treatment: 7 Constitutional Obese and well-hydrated in no acute distress. Respiratory normal breathing without difficulty. Psychiatric this patient is able to make decisions and demonstrates good insight into disease process. Alert and Oriented x 3. pleasant and cooperative. Notes Patient's wound is completely healed and showed signs of complete epithelization this is great news and overall seems to be doing quite well. Electronic Signature(s) Signed: 10/28/2022 1:15:21 PM By: Worthy Keeler PA-C Entered By: Worthy Keeler on 10/28/2022 13:15:21 -------------------------------------------------------------------------------- Physician Orders Details Patient Name: Date of Service: [name] [name] [name] 10/28/2022 1:15 PM Medical Record Number: [id number] Patient Account Number: [id number] Date of Birth/Sex: Treating RN: 05-30-1925 (89 y.o. [name] [name] ([id number]) [id number] Page 3 of 6 Primary Care Provider: [name], [name] Other Clinician: Referring Provider: Treating Provider/Extender: [name] [name] [name] [name] [name] in Treatment: 7 Verbal / Phone Orders:  No Diagnosis Coding ICD-10 Coding Code Description I89.0 Lymphedema, not elsewhere classified Q19.758 Non-pressure chronic ulcer of right ankle with fat layer exposed I32.54 Chronic diastolic (congestive) heart failure Discharge From [facility] Services Discharge from [facility] Complete Follow-up Appointments Other: - Call if needed Electronic Signature(s) Unsigned Entered By: Rosalio Loud on 10/28/2022 13:11:59 -------------------------------------------------------------------------------- Problem List Details Patient Name: Date of Service: [name] [name] 10/28/2022 1:15 PM Medical Record Number: [id number] Patient Account Number: [id number] Date of Birth/Sex: Treating RN: Mar 20, 1925 (89 y.o. [name] [name] Primary Care Provider: [name], Mary [name] Other Clinician: Referring Provider: Treating Provider/Extender: [name] [name] [name] [name] [name] in Treatment: 7 Active Problems ICD-10 Encounter Code Description Active Date MDM Diagnosis I89.0 Lymphedema, not elsewhere classified 09/09/2022 No Yes L97.312 Non-pressure chronic ulcer of right ankle with fat layer exposed 09/09/2022 No Yes E94.07 Chronic diastolic (congestive) heart failure 09/09/2022 No Yes Inactive Problems Resolved Problems Electronic Signature(s) Signed: 10/28/2022 12:59:07 PM By: Delanna Notice, Terrill [name] ([id number]) [id number] Page 4 of 6 Entered By: Worthy Keeler on 10/28/2022 12:59:07 -------------------------------------------------------------------------------- Progress Note Details Patient Name: Date of Service: [name] [name] 10/28/2022 1:15 PM Medical Record Number: [id number] Patient Account Number: [id number] Date of Birth/Sex: Treating RN: 04/13/1925 (89 y.o. [name] [name] Primary Care Provider: [name], Mary [name] Other Clinician: Referring Provider: Treating Provider/Extender: [name] [name] [name] [name] [name] in Treatment:  7 Subjective Chief Complaint Information obtained from Patient Right Lateral Ankle Ulcer History of Present Illness (HPI) ADMISSION 11/09/2021; this is a 89 year old woman who was accompanied by her caregiver who is her granddaughter. She has had a wound on her medial right calf since the beginning of December. Not exactly clear how this started. This was recently seen by palliative care and I think referred here. She has [facility] home health they have been using Vaseline gauze and her own stockings. She has chronic lymphedema and has had this for a number of years she has thigh-high  compression stockings. She also has an appointment with Dr. Delana Meyer on Monday at [facility] vein and vascular. She was recently hospitalized for acute on chronic diastolic heart failure and currently is getting IV Lasix as an outpatient 5 days a week. According to granddaughter her weight is come down nicely. Past medical history includes type 2 diabetes with peripheral neuropathy, hypertension, chronic diastolic congestive heart failure, stage IV chronic kidney disease ABI was very difficult to get in our clinic but we got a very low value of 0.48. The patient does not easily complain of claudication or pain in the leg 11/16/2021 upon evaluation today patient appears to be doing well currently in regard to her wounds. In fact the wound appears to be completely healed which is great news. She does want to see about a referral to the lymphedema clinic. Readmission: 09-10-2022 upon evaluation today patient's wound appears to be a blister on the lateral aspect of her right ankle which has been present currently for a couple of [name]. She could not get in here as quickly as they would like so she did go to see podiatry and they have taken pretty good care of this they ruptured the blister and had for in a Unna boot wrap. With that being said they have been using Betadine over top of that. I do believe she may be better  with the Xeroform gauze dressing to be honest I think this will not stick and will actually overall doing much better for her. Patient's family members in agreement with that plan. Otherwise her past medical history really has not changed significantly. She is currently under the care of hospice. 09-30-2022 upon evaluation today patient appears to be doing well currently in regard to her wound this is actually measuring better which is great news. Fortunately I see no signs of infection at this time. Unfortunately the Tubigrip has not been used we did not have a wrap this week and get any ABIs on her due to the extreme pain. In general I think she is actually making some good progress which is great news. 10-28-2022 upon evaluation today patient appears to be doing excellent in fact she is completely healed based on what I am seeing. I see no signs of active infection at this time which is great news. No fevers, chills, nausea, vomiting, or diarrhea. Objective Constitutional Obese and well-hydrated in no acute distress. Vitals Time Taken: 1:01 PM, Height: 60 in, Weight: 180 lbs, BMI: 35.2, Temperature: 97.5 F, Pulse: 64 bpm, Respiratory Rate: 18 breaths/min, Blood Pressure: 180/84 mmHg. Respiratory normal breathing without difficulty. KYMBERLEY, RAZ ([id number]) [id number] Page 5 of 6 Psychiatric this patient is able to make decisions and demonstrates good insight into disease process. Alert and Oriented x 3. pleasant and cooperative. General Notes: Patient's wound is completely healed and showed signs of complete epithelization this is great news and overall seems to be doing quite well. Integumentary (Hair, Skin) Wound #2 status is Open. Original cause of wound was Blister. The date acquired was: 08/26/2022. The wound has been in treatment 7 [name]. The wound is located on the Right,Lateral Malleolus. The wound measures 0cm length x 0cm width x 0cm depth; 0cm^2 area  and 0cm^3 volume. There is Fat Layer (Subcutaneous Tissue) exposed. There is a large amount of serous drainage noted. There is medium (34-66%) red, pink granulation within the wound bed. Assessment Active Problems ICD-10 Lymphedema, not elsewhere classified Non-pressure chronic ulcer of right ankle with fat layer exposed Chronic diastolic (congestive) heart  failure Plan Discharge From [facility] Services: Discharge from [facility] Complete Follow-up Appointments: Other: - Call if needed 1. I am going to recommend that we have the patient continue to monitor for any signs of infection or worsening. Based on what I am seeing I do think we were on the right track as far as keeping the swelling under control she has juxta lite compression wraps at home she will start using that on the right leg she is already been using on the left. 2. I am good recommend that she also continue to elevate her legs much as possible. Obviously the more that she does the better. We will see patient back for reevaluation in 1 week here in the clinic. If anything worsens or changes patient will contact our office for additional recommendations. Electronic Signature(s) Signed: 10/28/2022 1:15:49 PM By: Worthy Keeler PA-C Entered By: Worthy Keeler on 10/28/2022 13:15:48 -------------------------------------------------------------------------------- SuperBill Details Patient Name: Date of Service: [name] [name] [name] 10/28/2022 Medical Record Number: [id number] Patient Account Number: [id number] Date of Birth/Sex: Treating RN: 10-08-1925 (89 y.o. [name] [name] Primary Care Provider: [name], Mary [name] Other Clinician: Referring Provider: Treating Provider/Extender: [name] [name] [name] [name] [name] in Treatment: 7 Diagnosis Coding ICD-10 Codes Code Description I89.0 Lymphedema, not elsewhere classified C58.850 Non-pressure chronic ulcer of right ankle with fat layer exposed [place] [place]  ([id number]) [id number] Page 6 of 6 K81.27 Chronic diastolic (congestive) heart failure Facility Procedures : CPT4 Code: [id number] Description: 94496 - WOUND CARE VISIT-LEV 3 EST PT Modifier: Quantity: 1 Physician Procedures : CPT4 Code Description Modifier [id number] - WC PHYS LEVEL 3 - EST PT ICD-10 Diagnosis Description I89.0 Lymphedema, not elsewhere classified D35.70 Chronic diastolic (congestive) heart failure L97.312 Non-pressure chronic ulcer of right ankle with  fat layer exposed Quantity: 1 Electronic Signature(s) Signed: 10/28/2022 1:16:35 PM By: Worthy Keeler PA-C Entered By: Worthy Keeler on 10/28/2022 13:16:35

## 2022-12-17 IMAGING — CR DG CHEST 2V
1 series · 2 of 2 positions shown · non-contrast
Comparison: 09/18/2021

CLINICAL DATA: CHF

EXAM:
CHEST - 2 VIEW

[Series 1: dg chest 2 view · 0.14mm/px · 2 of 2 slices shown]
[im 1/2]
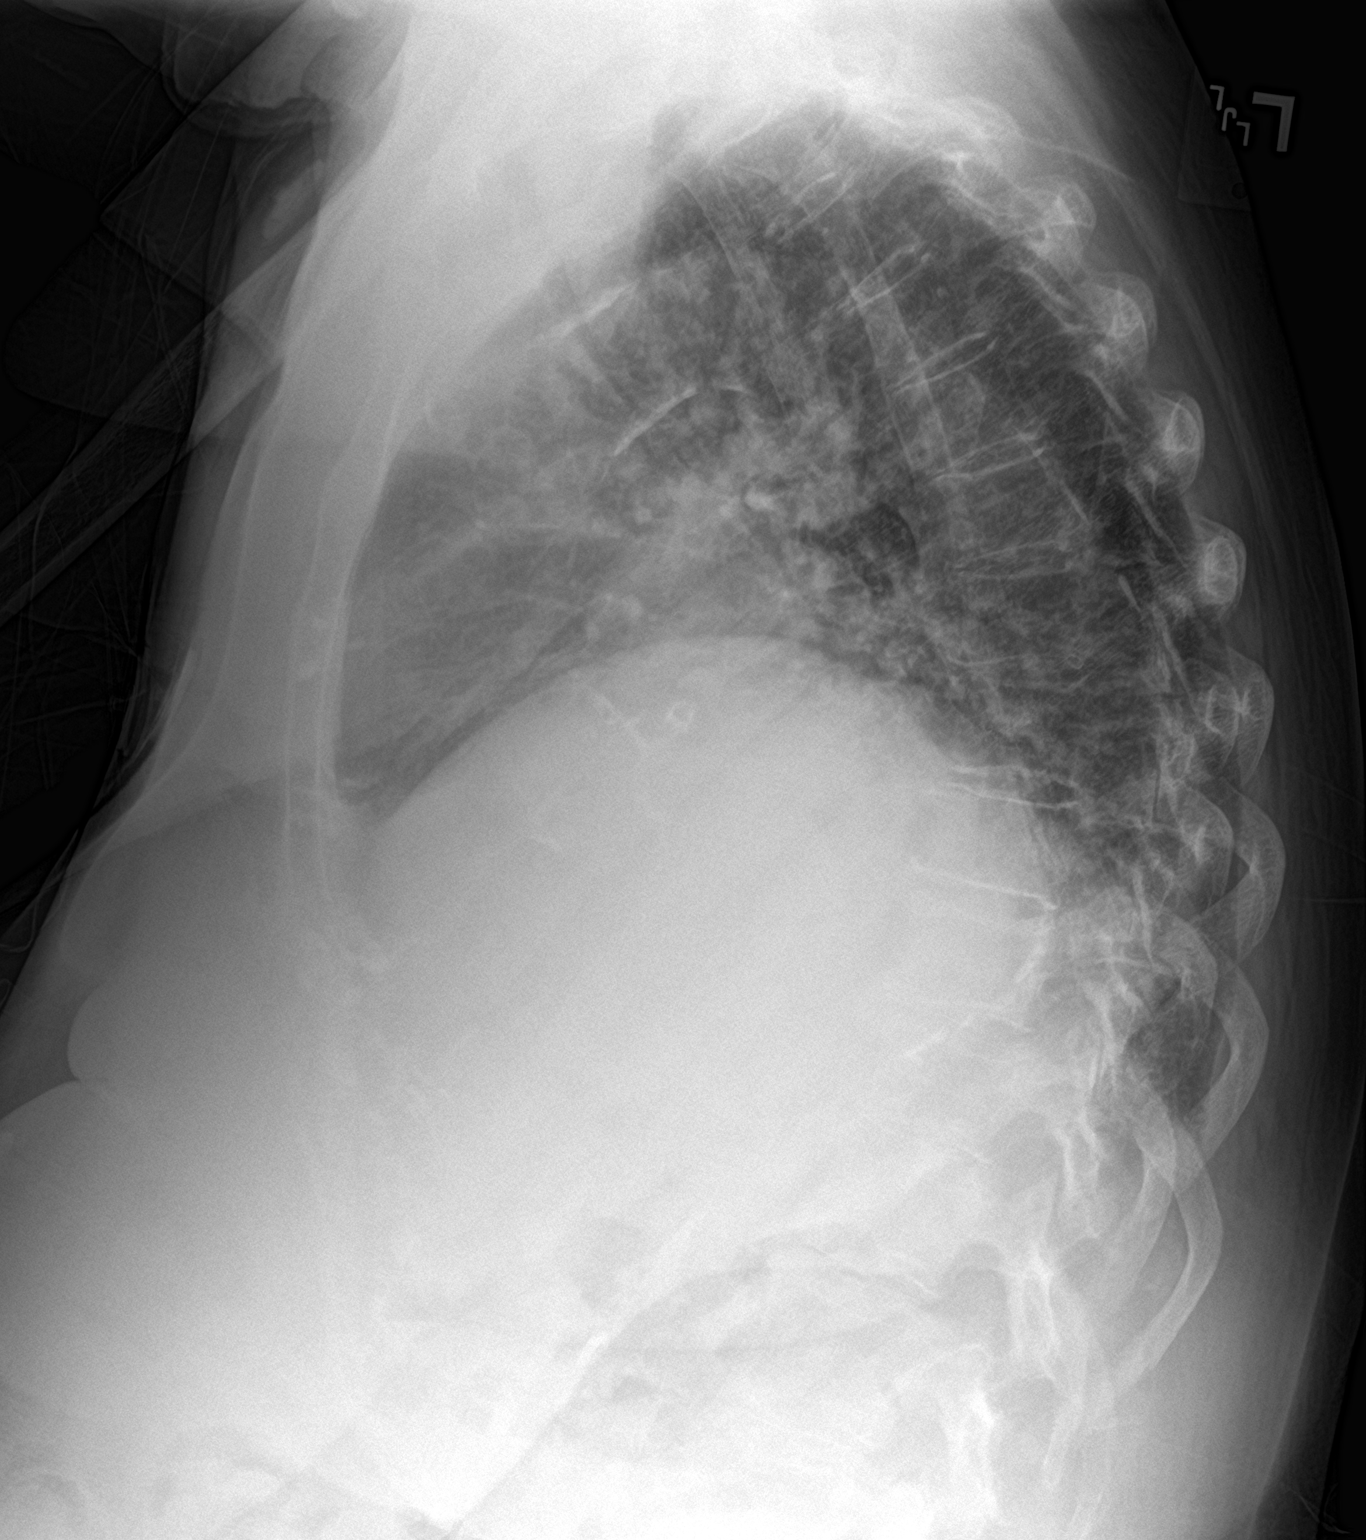
[im 2/2]
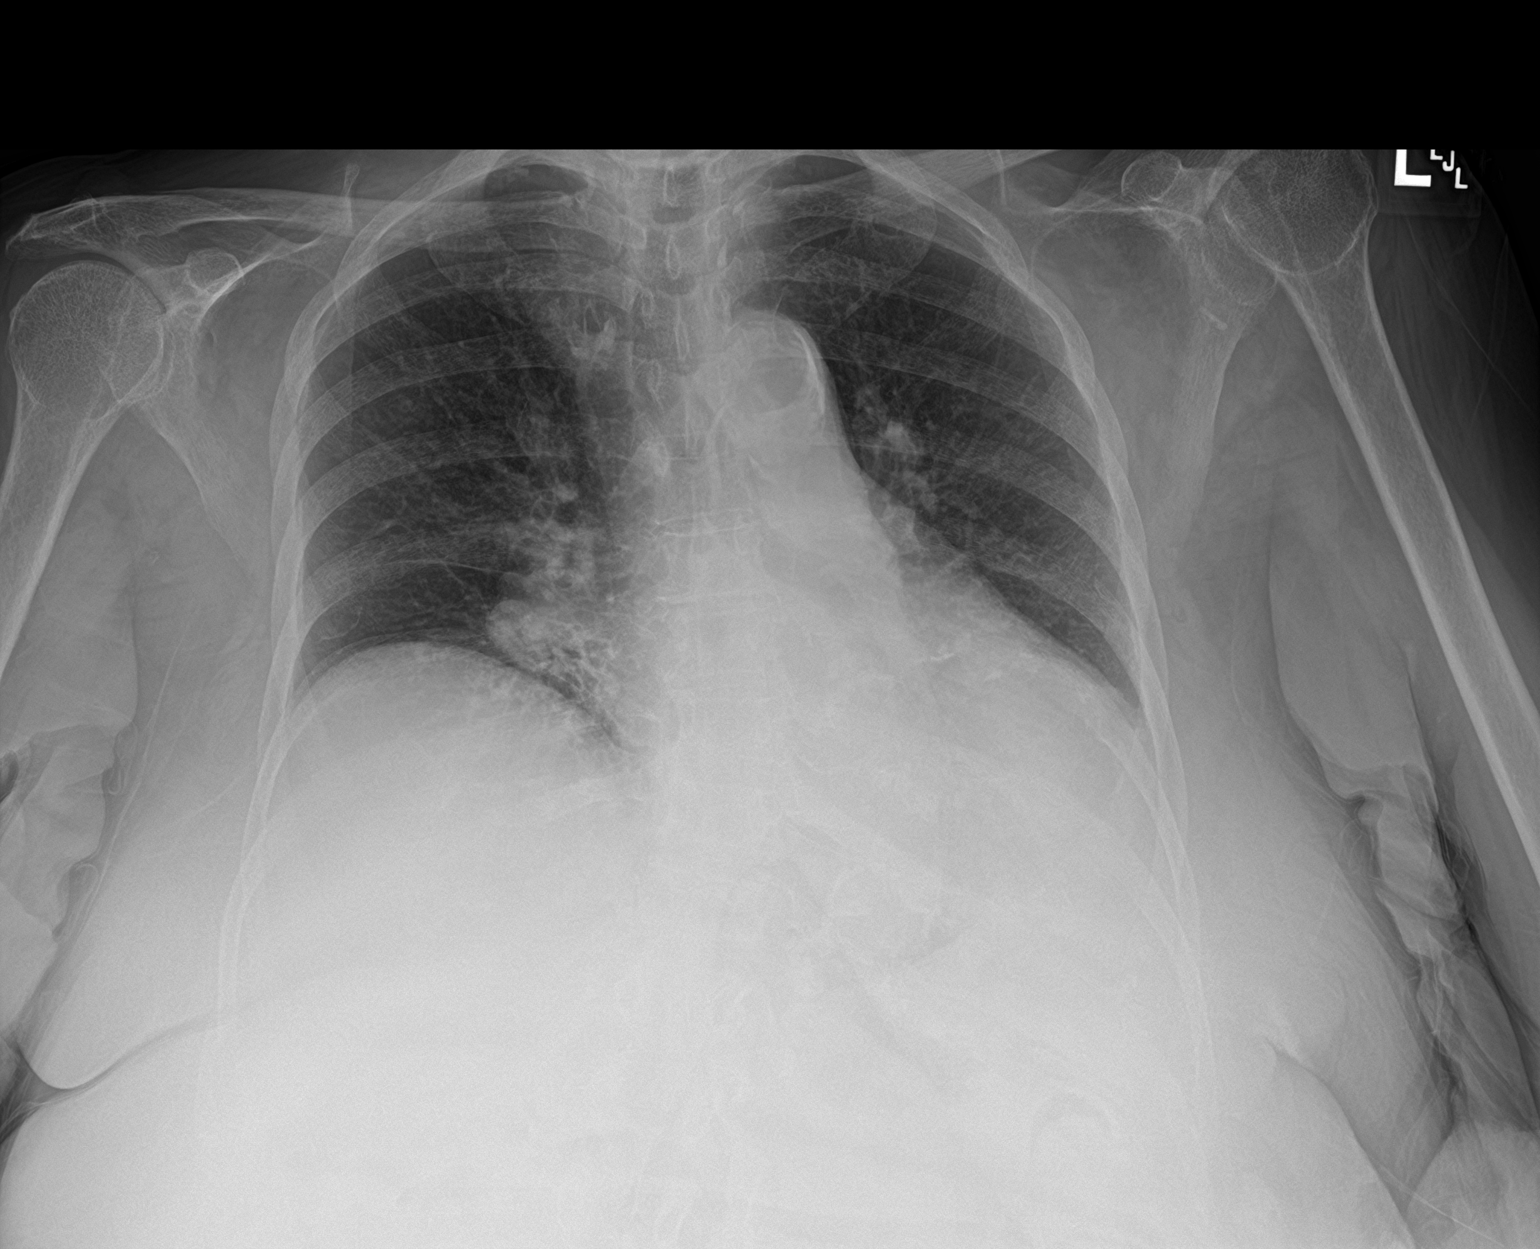

[2 of 2 positions shown; findings below may reference images not displayed]

FINDINGS: Cardiac enlargement without heart failure. Atherosclerotic
calcification aortic arch

Moderate elevation right hemidiaphragm unchanged. Right lower lobe
airspace density unchanged. This is likely atelectasis given the
elevated hemidiaphragm. Mild left lower lobe atelectasis unchanged.
No significant pleural effusion.
IMPRESSION: Cardiac enlargement without heart failure.

Bibasilar airspace disease unchanged most likely atelectasis.
Moderate elevation right hemidiaphragm unchanged.

## 2023-10-16 ENCOUNTER — Encounter: Payer: Medicare PPO | Attending: Internal Medicine | Admitting: Internal Medicine

## 2023-10-16 DIAGNOSIS — E1142 Type 2 diabetes mellitus with diabetic polyneuropathy: Secondary | ICD-10-CM | POA: Diagnosis not present

## 2023-10-16 DIAGNOSIS — L97319 Non-pressure chronic ulcer of right ankle with unspecified severity: Secondary | ICD-10-CM | POA: Diagnosis not present

## 2023-10-16 DIAGNOSIS — M1990 Unspecified osteoarthritis, unspecified site: Secondary | ICD-10-CM | POA: Insufficient documentation

## 2023-10-16 DIAGNOSIS — E1122 Type 2 diabetes mellitus with diabetic chronic kidney disease: Secondary | ICD-10-CM | POA: Insufficient documentation

## 2023-10-16 DIAGNOSIS — I890 Lymphedema, not elsewhere classified: Secondary | ICD-10-CM | POA: Diagnosis not present

## 2023-10-16 DIAGNOSIS — I2510 Atherosclerotic heart disease of native coronary artery without angina pectoris: Secondary | ICD-10-CM | POA: Diagnosis not present

## 2023-10-16 DIAGNOSIS — N186 End stage renal disease: Secondary | ICD-10-CM | POA: Diagnosis not present

## 2023-10-16 DIAGNOSIS — I132 Hypertensive heart and chronic kidney disease with heart failure and with stage 5 chronic kidney disease, or end stage renal disease: Secondary | ICD-10-CM | POA: Diagnosis not present

## 2023-10-16 DIAGNOSIS — E11622 Type 2 diabetes mellitus with other skin ulcer: Secondary | ICD-10-CM | POA: Diagnosis present

## 2023-10-16 DIAGNOSIS — I5033 Acute on chronic diastolic (congestive) heart failure: Secondary | ICD-10-CM | POA: Diagnosis not present

## 2023-10-17 NOTE — Progress Notes (Signed)
ANNISE, HUNDERTMARK ([id number]) [id number] Page 1 of 8 Visit Report for 10/16/2023 Chief Complaint Document Details Patient Name: Date of Service: [name] [name] [name] 10/16/2023 2:00 PM Medical Record Number: [id number] Patient Account Number: [id number] Date of Birth/Sex: Treating RN: 1925-08-14 (89 y.o. Ginette Pitman Primary Care Provider: [name], Vanessa Kick Other Clinician: Referring Provider: Treating Provider/Extender: Chauncey Mann, MICHA EL G Self, Referral Weeks in Treatment: 0 Information Obtained from: Patient Chief Complaint Right Lateral Ankle Ulcer 10/16/2023; the patient comes in with her caregiver who is her granddaughter for complaints of preulcerative swelling on the left leg and right ankle pain Electronic Signature(s) Signed: 10/17/2023 12:26:56 PM By: Baltazar Najjar MD Entered By: Baltazar Najjar on 10/16/2023 11:41:50 -------------------------------------------------------------------------------- HPI Details Patient Name: Date of Service: [name] [name] [name] 10/16/2023 2:00 PM Medical Record Number: [id number] Patient Account Number: [id number] Date of Birth/Sex: Treating RN: 10-21-1925 (89 y.o. Ginette Pitman Primary Care Provider: [name], Vanessa Kick Other Clinician: Referring Provider: Treating Provider/Extender: Chauncey Mann, MICHA EL G Self, Referral Weeks in Treatment: 0 History of Present Illness HPI Description: ADMISSION 11/09/2021; this is a 89 year old woman who was accompanied by her caregiver who is her granddaughter. She has had a wound on her medial right calf since the beginning of December. Not exactly clear how this started. This was recently seen by palliative care and I think referred here. She has [facility] home health they have been using Vaseline gauze and her own stockings. She has chronic lymphedema and has had this for a number of years she has thigh-high compression stockings. She also has an appointment with Dr. Gilda Crease on  Monday at [facility] vein and vascular. She was recently hospitalized for acute on chronic diastolic heart failure and currently is getting IV Lasix as an outpatient 5 days a week. According to granddaughter her weight is come down nicely. Past medical history includes type 2 diabetes with peripheral neuropathy, hypertension, chronic diastolic congestive heart failure, stage IV chronic kidney disease ABI was very difficult to get in our clinic but we got a very low value of 0.48. The patient does not easily complain of claudication or pain in the leg 11/16/2021 upon evaluation today patient appears to be doing well currently in regard to her wounds. In fact the wound appears to be completely healed which is CABOT, Geraldine Contras ([id number]) [id number] Page 2 of 8 great news. She does want to see about a referral to the lymphedema clinic. Readmission: 09-10-2022 upon evaluation today patient's wound appears to be a blister on the lateral aspect of her right ankle which has been present currently for a couple of weeks. She could not get in here as quickly as they would like so she did go to see podiatry and they have taken pretty good care of this they ruptured the blister and had for in a Unna boot wrap. With that being said they have been using Betadine over top of that. I do believe she may be better with the Xeroform gauze dressing to be honest I think this will not stick and will actually overall doing much better for her. Patient's family members in agreement with that plan. Otherwise her past medical history really has not changed significantly. She is currently under the care of hospice. 09-30-2022 upon evaluation today patient appears to be doing well currently in regard to her wound this is actually measuring better which is great news. Fortunately I see no signs of infection at this time. Unfortunately the Tubigrip has not been used we  did not have a wrap this week and  get any ABIs on her due to the extreme pain. In general I think she is actually making some good progress which is great news. 10-28-2022 upon evaluation today patient appears to be doing excellent in fact she is completely healed based on what I am seeing. I see no signs of active infection at this time which is great news. No fevers, chills, nausea, vomiting, or diarrhea. READMISSION 10/16/2023 This is a 89 year old woman who is apparently under hospice care. She has known chronic lymphedema, chronic diastolic congestive heart failure type 2 diabetes with peripheral neuropathy and chronic kidney disease stage IV. She is apparently on hospice care according to her granddaughter for the chronic kidney disease. She has known chronic lymphedema and swelling. Apparently this has been increasing. Her granddaughter is concerned that she may have breakdown in the previous ulcerative area on the left anterior lower mid tibia. Also complaining about ankle pain at night She is not using compression stockings indeed I could not see that we ordered her any or suggest in any when she left the clinic last time. She does however apparently have external compression pumps but they are only using them 1-2 times every 2 weeks per the granddaughter Electronic Signature(s) Signed: 10/17/2023 12:26:56 PM By: Baltazar Najjar MD Entered By: Baltazar Najjar on 10/16/2023 11:51:11 -------------------------------------------------------------------------------- Physical Exam Details Patient Name: Date of Service: [name] [name] [name] 10/16/2023 2:00 PM Medical Record Number: [id number] Patient Account Number: [id number] Date of Birth/Sex: Treating RN: [birth year] (89 y.o. Ginette Pitman Primary Care Provider: [name], Vanessa Kick Other Clinician: Referring Provider: Treating Provider/Extender: RO BSO N, MICHA EL G Self, Referral Weeks in Treatment: 0 Constitutional Patient is hypertensive.. Pulse regular and within target  range for patient.Marland Kitchen Respirations regular, non-labored and within target range.. Temperature is normal and within the target range for the patient.Marland Kitchen appears in no distress. Respiratory Lungs are clear to auscultation. Cardiovascular Heart sounds are normal. No convincing evidence of congestive heart failure. Pedal pulses are not palpable bilaterally through the lymphedema.. Edema present in both extremities.Lymphedema stage III. Nontender. No warmth. Musculoskeletal Right ankle is inverted but does not seem overtly painful. No tenderness is noted. Notes Wound exam; there is no open wound. However I think her granddaughter's concern is legitimate. If we do not make some effort to control the swelling then it is possible that she will have skin breakdown. Right now the area on the left anterior to lower mid tibia looks like the most problematic area Electronic Signature(s) Signed: 10/17/2023 12:26:56 PM By: Baltazar Najjar MD Entered By: Baltazar Najjar on 10/16/2023 11:52:35 Ozanich, Geraldine Contras ([id number]) [id number] Page 3 of 8 -------------------------------------------------------------------------------- Physician Orders Details Patient Name: Date of Service: Satira Mccallum 10/16/2023 2:00 PM Medical Record Number: [id number] Patient Account Number: [id number] Date of Birth/Sex: Treating RN: 1924-11-08 (89 y.o. Ginette Pitman Primary Care Provider: [name], Vanessa Kick Other Clinician: Referring Provider: Treating Provider/Extender: RO BSO N, MICHA EL G Self, Referral Weeks in Treatment: 0 Verbal / Phone Orders: No Diagnosis Coding Discharge From [facility] Consult Only - May apply A and D ointment to the areas of concern. Purchase Tubi grip size D to keep on the legs. Elevate and use lymphedema pumps one hour every day. Electronic Signature(s) Signed: 10/16/2023 4:44:21 PM By: Midge Aver MSN RN CNS WTA Signed: 10/17/2023 12:26:56 PM By: Baltazar Najjar  MD Entered By: Midge Aver on 10/16/2023 11:45:54 -------------------------------------------------------------------------------- Problem List Details Patient Name: Date of Service: LA TTA,  [facility] [name] 10/16/2023 2:00 PM Medical Record Number: [id number] Patient Account Number: [id number] Date of Birth/Sex: Treating RN: 1925-04-11 (89 y.o. Ginette Pitman Primary Care Provider: [name], Vanessa Kick Other Clinician: Referring Provider: Treating Provider/Extender: RO BSO Dorris Carnes, MICHA EL G Self, Referral Weeks in Treatment: 0 Active Problems ICD-10 Encounter Code Description Active Date MDM Diagnosis I89.0 Lymphedema, not elsewhere classified 10/16/2023 No Yes I50.32 Chronic diastolic (congestive) heart failure 10/16/2023 No Yes Inactive Problems Rasnic, Trulee ([id number]) [id number] Page 4 of 8 Resolved Problems Electronic Signature(s) Signed: 10/17/2023 12:26:56 PM By: Baltazar Najjar MD Entered By: Baltazar Najjar on 10/16/2023 11:50:23 -------------------------------------------------------------------------------- Progress Note Details Patient Name: Date of Service: Eudelia Bunch [facility] [name] 10/16/2023 2:00 PM Medical Record Number: [id number] Patient Account Number: [id number] Date of Birth/Sex: Treating RN: [birth year] (89 y.o. Ginette Pitman Primary Care Provider: [name], Vanessa Kick Other Clinician: Referring Provider: Treating Provider/Extender: Chauncey Mann, MICHA EL G Self, Referral Weeks in Treatment: 0 Subjective Chief Complaint Information obtained from Patient Right Lateral Ankle Ulcer 10/16/2023; the patient comes in with her caregiver who is her granddaughter for complaints of preulcerative swelling on the left leg and right ankle pain History of Present Illness (HPI) ADMISSION 11/09/2021; this is a 89 year old woman who was accompanied by her caregiver who is her granddaughter. She has had a wound on her medial right calf since the beginning of  December. Not exactly clear how this started. This was recently seen by palliative care and I think referred here. She has [facility] home health they have been using Vaseline gauze and her own stockings. She has chronic lymphedema and has had this for a number of years she has thigh-high compression stockings. She also has an appointment with Dr. Gilda Crease on Monday at [facility] vein and vascular. She was recently hospitalized for acute on chronic diastolic heart failure and currently is getting IV Lasix as an outpatient 5 days a week. According to granddaughter her weight is come down nicely. Past medical history includes type 2 diabetes with peripheral neuropathy, hypertension, chronic diastolic congestive heart failure, stage IV chronic kidney disease ABI was very difficult to get in our clinic but we got a very low value of 0.48. The patient does not easily complain of claudication or pain in the leg 11/16/2021 upon evaluation today patient appears to be doing well currently in regard to her wounds. In fact the wound appears to be completely healed which is great news. She does want to see about a referral to the lymphedema clinic. Readmission: 09-10-2022 upon evaluation today patient's wound appears to be a blister on the lateral aspect of her right ankle which has been present currently for a couple of weeks. She could not get in here as quickly as they would like so she did go to see podiatry and they have taken pretty good care of this they ruptured the blister and had for in a Unna boot wrap. With that being said they have been using Betadine over top of that. I do believe she may be better with the Xeroform gauze dressing to be honest I think this will not stick and will actually overall doing much better for her. Patient's family members in agreement with that plan. Otherwise her past medical history really has not changed significantly. She is currently under the care of hospice. 09-30-2022  upon evaluation today patient appears to be doing well currently in regard to her wound this is actually measuring better which is great news. Fortunately I see no signs of infection at  this time. Unfortunately the Tubigrip has not been used we did not have a wrap this week and get any ABIs on her due to the extreme pain. In general I think she is actually making some good progress which is great news. 10-28-2022 upon evaluation today patient appears to be doing excellent in fact she is completely healed based on what I am seeing. I see no signs of active infection at this time which is great news. No fevers, chills, nausea, vomiting, or diarrhea. READMISSION 10/16/2023 This is a 89 year old woman who is apparently under hospice care. She has known chronic lymphedema, chronic diastolic congestive heart failure type 2 diabetes with peripheral neuropathy and chronic kidney disease stage IV. She is apparently on hospice care according to her granddaughter for the chronic kidney disease. She has known chronic lymphedema and swelling. Apparently this has been increasing. Her granddaughter is concerned that she may have breakdown in the previous ulcerative area on the left anterior lower mid tibia. Also complaining about ankle pain at night Patient History Information obtained from Caregiver. KENTERIA, LIPPE ([id number]) [id number] Page 5 of 8 Allergies lisinopril (Severity: Moderate), prednisone (Severity: Moderate), codeine (Severity: Severe, Reaction: swelling) Social History Never smoker, Marital Status - Widowed, Alcohol Use - Never, Drug Use - No History, Caffeine Use - Moderate. Medical History Eyes Patient has history of Cataracts - hx surgery Hematologic/Lymphatic Patient has history of Lymphedema Respiratory Patient has history of Sleep Apnea - non compliant CPAP Cardiovascular Patient has history of Congestive Heart Failure - extensive/BNP 6, Coronary Artery  Disease, Hypertension Endocrine Patient has history of Type II Diabetes - dx noted Genitourinary Patient has history of End Stage Renal Disease - CKD 3 Integumentary (Skin) Denies history of History of pressure wounds Musculoskeletal Patient has history of Osteoarthritis Denies history of Gout Neurologic Patient has history of Dementia, Neuropathy Denies history of Seizure Disorder Review of Systems (ROS) Constitutional Symptoms (General Health) Denies complaints or symptoms of Fatigue, Fever, Chills, Marked Weight Change. Ear/Nose/Mouth/Throat Denies complaints or symptoms of Difficult clearing ears, Sinusitis. Gastrointestinal Denies complaints or symptoms of Frequent diarrhea, Nausea, Vomiting. Immunological Denies complaints or symptoms of Hives, Itching. Psychiatric Denies complaints or symptoms of Anxiety, Claustrophobia. Objective Constitutional Patient is hypertensive.. Pulse regular and within target range for patient.Marland Kitchen Respirations regular, non-labored and within target range.. Temperature is normal and within the target range for the patient.Marland Kitchen appears in no distress. Vitals Time Taken: 2:04 PM, Temperature: 97.7 F, Pulse: 87 bpm, Respiratory Rate: 18 breaths/min, Blood Pressure: 186/79 mmHg. Cardiovascular Pedal pulses are not palpable bilaterally through the lymphedema.. Edema present in both extremities.Lymphedema stage III. Nontender. No warmth. Musculoskeletal Right ankle is inverted but does not seem overtly painful. No tenderness is noted. General Notes: Wound exam; there is no open wound. However I think her granddaughter's concern is legitimate. If we do not make some effort to control the swelling then it is possible that she will have skin breakdown. Right now the area on the left anterior to lower mid tibia looks like the most problematic area Assessment Active Problems ICD-10 Lymphedema, not elsewhere classified Plan Discharge From [facility] Services: Consult  Only - May apply A and D ointment to the areas of concern. Purchase Tubi grip size D to keep on the legs. Elevate and use lymphedema pumps one hour every day. LASHUN, FREEMAN ([id number]) [id number] Page 6 of 8 1. As noted the patient has no open wounds. 2. Very significant lymphedema bilaterally extending into her dorsal feet. 3. No definitive area on  either leg that appears to be open. No obvious infection is noted. 4. It is difficult to be sure about the adequacy of arterial flow 5. The patient is on hospice care for end-stage renal disease. 6. In terms of the edema we recommended Tubigrip size D which she had on last time. This can be purchased online on [employer] for instance. She also has compression pumps at home that they do not use very often I have recommended using these 1 hour once daily Electronic Signature(s) Signed: 10/17/2023 12:26:56 PM By: Baltazar Najjar MD Entered By: Baltazar Najjar on 10/16/2023 11:51:49 -------------------------------------------------------------------------------- ROS/PFSH Details Patient Name: Date of Service: [name] [name] [name] 10/16/2023 2:00 PM Medical Record Number: [id number] Patient Account Number: [id number] Date of Birth/Sex: Treating RN: 09/27/1925 (89 y.o. Ginette Pitman Primary Care Provider: [name], Vanessa Kick Other Clinician: Referring Provider: Treating Provider/Extender: RO BSO N, MICHA EL G Self, Referral Weeks in Treatment: 0 Information Obtained From Caregiver Constitutional Symptoms (General Health) Complaints and Symptoms: Negative for: Fatigue; Fever; Chills; Marked Weight Change Ear/Nose/Mouth/Throat Complaints and Symptoms: Negative for: Difficult clearing ears; Sinusitis Gastrointestinal Complaints and Symptoms: Negative for: Frequent diarrhea; Nausea; Vomiting Immunological Complaints and Symptoms: Negative for: Hives; Itching Psychiatric Complaints and Symptoms: Negative for: Anxiety;  Claustrophobia Eyes Medical History: Positive for: Cataracts - hx surgery Hematologic/Lymphatic Medical History: Positive for: Lymphedema Respiratory Dillin, Geraldine Contras ([id number]) [id number] Page 7 of 8 Medical History: Positive for: Sleep Apnea - non compliant CPAP Cardiovascular Medical History: Positive for: Congestive Heart Failure - extensive/BNP 6; Coronary Artery Disease; Hypertension Endocrine Medical History: Positive for: Type II Diabetes - dx noted Time with diabetes: 2-3 years Blood sugar tested every day: No Genitourinary Medical History: Positive for: End Stage Renal Disease - CKD 3 Integumentary (Skin) Medical History: Negative for: History of pressure wounds Musculoskeletal Medical History: Positive for: Osteoarthritis Negative for: Gout Neurologic Medical History: Positive for: Dementia; Neuropathy Negative for: Seizure Disorder Oncologic HBO Extended History Items Eyes: Cataracts Immunizations Pneumococcal Vaccine: Received Pneumococcal Vaccination: Yes Received Pneumococcal Vaccination On or After 60th Birthday: Yes Implantable Devices None Family and Social History Never smoker; Marital Status - Widowed; Alcohol Use: Never; Drug Use: No History; Caffeine Use: Moderate Social Determinants of Health (SDOH) 1. In the past 2 months, did you or others you live with eat smaller meals or skip meals because you didn't have money for foodo : No 2. Are you homeless or worried that you might be in the futureo : No 3. Do you have trouble paying for your utilities (gas, electricity, phone)o : No 4. Do you have trouble finding or paying for a rideo : No 5. Do you need daycare, or better daycare, for your kidso : No 6. Are you unemployed or without regular incomeo : No 7. Do you need help finding a better jobo : No 8. Do you need help getting more educationo : No 9. Are you concerned about someone in your home using drugs or  alcoholo : No 10. Do you feel unsafe in your daily lifeo : No 11. Is anyone in your home threatening or abusing youo : No 12. Do you lack quality relationships that make you feel valued and supportedo : No 13. Do you need help getting cultural information in a language you understando : No 14. Do you need help getting internet accesso : No Advanced Directives and Instructions Electronic Signature(s) Signed: 10/16/2023 4:44:21 PM By: Midge Aver MSN RN CNS WTA Signed: 10/17/2023 12:26:56 PM By: Baltazar Najjar MD Consalvo, Geraldine Contras ([id number])  [id number] [id number].pdf Page 8 of 8 Entered By: Midge Aver on 10/16/2023 10:59:56 -------------------------------------------------------------------------------- SuperBill Details Patient Name: Date of Service: Satira Mccallum 10/16/2023 Medical Record Number: [id number] Patient Account Number: [id number] Date of Birth/Sex: Treating RN: 1924/10/29 (89 y.o. Ginette Pitman Primary Care Provider: [name], Vanessa Kick Other Clinician: Referring Provider: Treating Provider/Extender: RO BSO N, MICHA EL G Self, Referral Weeks in Treatment: 0 Diagnosis Coding ICD-10 Codes Code Description I89.0 Lymphedema, not elsewhere classified I50.32 Chronic diastolic (congestive) heart failure Facility Procedures : CPT4 Code: [id number] Description: 99213 - WOUND CARE VISIT-LEV 3 EST PT Modifier: Quantity: 1 Physician Procedures : CPT4 Code Description Modifier [id number] 99214 - WC PHYS LEVEL 4 - EST PT ICD-10 Diagnosis Description I89.0 Lymphedema, not elsewhere classified I50.32 Chronic diastolic (congestive) heart failure Quantity: 1 Electronic Signature(s) Signed: 10/17/2023 12:26:56 PM By: Baltazar Najjar MD Entered By: Baltazar Najjar on 10/16/2023 11:52:03

## 2023-10-17 NOTE — Progress Notes (Signed)
[name], [name] ([id number]) [id number] Page 1 of 8 Visit Report for 10/16/2023 Allergy List Details Patient Name: Date of Service: [name] [name] [facility] [name] 10/16/2023 2:00 PM Medical Record Number: [id number] Patient Account Number: [id number] Date of Birth/Sex: Treating RN: 14-Oct-1925 (89 y.o. Ginette Pitman Primary Care Subhan Hoopes: [name], Vanessa Kick Other Clinician: Referring Lenna Hagarty: Treating Geary Rufo/Extender: RO BSO N, MICHA EL G Self, Referral Weeks in Treatment: 0 Allergies Active Allergies lisinopril Severity: Moderate prednisone Severity: Moderate codeine Reaction: swelling Severity: Severe Allergy Notes Electronic Signature(s) Signed: 10/16/2023 4:44:21 PM By: Midge Aver MSN RN CNS WTA Entered By: Midge Aver on 10/16/2023 10:55:38 -------------------------------------------------------------------------------- Arrival Information Details Patient Name: Date of Service: [name] [name] [name] 10/16/2023 2:00 PM Medical Record Number: [id number] Patient Account Number: [id number] Date of Birth/Sex: Treating RN: 01-May-1925 (89 y.o. Ginette Pitman Primary Care Catia Todorov: [name], Vanessa Kick Other Clinician: Referring Zafira Munos: Treating Trinity Hyland/Extender: RO BSO N, MICHA EL G Self, Referral Weeks in Treatment: 0 Visit Information Patient Arrived: Wheel Chair Arrival Time: 14:00 Accompanied By: Granddaughter Transfer Assistance: None Patient Identification Verified: Yes Secondary Verification Process Completed: Yes Patient Requires Transmission-Based Precautions: No Fregia, Havyn ([id number]) Patient Requires Transmission-Based Precautions: No Patient Has Alerts: Yes Patient Alerts: Diabetic Type Hospice Care History Since Last Visit Added or deleted any medications: No Any new allergies or adverse reactions: No Had a fall or experienced change in activities of daily living that may affect risk of falls: No Hospitalized since last visit: No Has  Dressing in Place as Prescribed: Yes [phone].pdf Page 2 of 8 Pain Present Now: No Electronic Signature(s) Signed: 10/16/2023 4:44:21 PM By: Midge Aver MSN RN CNS WTA Entered By: Midge Aver on 10/16/2023 11:38:49 -------------------------------------------------------------------------------- Clinic Level of Care Assessment Details Patient Name: Date of Service: [name] [name] [facility] [name] 10/16/2023 2:00 PM Medical Record Number: [id number] Patient Account Number: [id number] Date of Birth/Sex: Treating RN: 1925/07/12 (89 y.o. Ginette Pitman Primary Care Joselin Crandell: [name], Vanessa Kick Other Clinician: Referring Wayburn Shaler: Treating Manolo Bosket/Extender: RO BSO N, MICHA EL G Self, Referral Weeks in Treatment: 0 Clinic Level of Care Assessment Items TOOL 2 Quantity Score X- 1 0 Use when only an EandM is performed on the INITIAL visit ASSESSMENTS - Nursing Assessment / Reassessment X- 1 20 General Physical Exam (combine w/ comprehensive assessment (listed just below) when performed on new pt. evals) X- 1 25 Comprehensive Assessment (HX, ROS, Risk Assessments, Wounds Hx, etc.) ASSESSMENTS - Wound and Skin A ssessment / Reassessment []  - 0 Simple Wound Assessment / Reassessment - one wound []  - 0 Complex Wound Assessment / Reassessment - multiple wounds []  - 0 Dermatologic / Skin Assessment (not related to wound area) ASSESSMENTS - Ostomy and/or Continence Assessment and Care []  - 0 Incontinence Assessment and Management []  - 0 Ostomy Care Assessment and Management (repouching, etc.) PROCESS - Coordination of Care X - Simple Patient / Family Education for ongoing care 1 15 []  - 0 Complex (extensive) Patient / Family Education for ongoing care X- 1 10 Staff obtains Chiropractor, Records, T Results / Process Orders est []  - 0 Staff telephones HHA, Nursing Homes / Clarify orders / etc []  - 0 Routine Transfer to another Facility (non-emergent condition) []  - 0 Routine  Hospital Admission (non-emergent condition) X- 1 15 New Admissions / Manufacturing engineer / Ordering NPWT Apligraf, etc. , []  - 0 Emergency Hospital Admission (emergent condition) X- 1 10 Simple Discharge Coordination []  - 0 Complex (extensive) Discharge Coordination PROCESS - Special Needs []  - 0 Pediatric / Minor Patient Management []  -  0 Isolation Patient Management LORYN, THOUSAND ([id number]) [id number] Page 3 of 8 []  - 0 Hearing / Language / Visual special needs []  - 0 Assessment of Community assistance (transportation, D/C planning, etc.) []  - 0 Additional assistance / Altered mentation []  - 0 Support Surface(s) Assessment (bed, cushion, seat, etc.) INTERVENTIONS - Wound Cleansing / Measurement X- 1 5 Wound Imaging (photographs - any number of wounds) []  - 0 Wound Tracing (instead of photographs) []  - 0 Simple Wound Measurement - one wound []  - 0 Complex Wound Measurement - multiple wounds []  - 0 Simple Wound Cleansing - one wound []  - 0 Complex Wound Cleansing - multiple wounds INTERVENTIONS - Wound Dressings []  - 0 Small Wound Dressing one or multiple wounds []  - 0 Medium Wound Dressing one or multiple wounds []  - 0 Large Wound Dressing one or multiple wounds []  - 0 Application of Medications - injection INTERVENTIONS - Miscellaneous []  - 0 External ear exam []  - 0 Specimen Collection (cultures, biopsies, blood, body fluids, etc.) []  - 0 Specimen(s) / Culture(s) sent or taken to Lab for analysis []  - 0 Patient Transfer (multiple staff / Nurse, adult / Similar devices) []  - 0 Simple Staple / Suture removal (25 or less) []  - 0 Complex Staple / Suture removal (26 or more) []  - 0 Hypo / Hyperglycemic Management (close monitor of Blood Glucose) X- 1 15 Ankle / Brachial Index (ABI) - do not check if billed separately Has the patient been seen at the hospital within the last three years: Yes Total Score: 115 Level Of Care:  New/Established - Level 3 Electronic Signature(s) Signed: 10/16/2023 4:44:21 PM By: Midge Aver MSN RN CNS WTA Entered By: Midge Aver on 10/16/2023 11:46:46 -------------------------------------------------------------------------------- Encounter Discharge Information Details Patient Name: Date of Service: [name] [name] [name] 10/16/2023 2:00 PM Medical Record Number: [id number] Patient Account Number: [id number] Date of Birth/Sex: Treating RN: May 25, 1925 (89 y.o. Ginette Pitman Primary Care Egan Berkheimer: [name], Vanessa Kick Other Clinician: Referring Tynasia Mccaul: Treating Rama Sorci/Extender: Chauncey Mann, MICHA EL G Self, Referral Weeks in Treatment: 0 Encounter Discharge Information Items Discharge Condition: Stable MAKAYDEN, REDBIRD ([id number]) [id number] Page 4 of 8 Ambulatory Status: Wheelchair Discharge Destination: Home Transportation: Other Accompanied By: granddaughter Schedule Follow-up Appointment: No Clinical Summary of Care: Electronic Signature(s) Signed: 10/16/2023 4:44:21 PM By: Midge Aver MSN RN CNS WTA Entered By: Midge Aver on 10/16/2023 11:48:27 -------------------------------------------------------------------------------- Lower Extremity Assessment Details Patient Name: Date of Service: [name] [name] [facility] [name] 10/16/2023 2:00 PM Medical Record Number: [id number] Patient Account Number: [id number] Date of Birth/Sex: Treating RN: 1924-10-24 (89 y.o. Ginette Pitman Primary Care Aleece Loyd: [name], Vanessa Kick Other Clinician: Referring Kamren Heintzelman: Treating Tawanda Schall/Extender: RO BSO N, MICHA EL G Self, Referral Weeks in Treatment: 0 Edema Assessment Assessed: [Left: No] [Right: No] [Left: Edema] [Right: :] Calf Left: Right: Point of Measurement: 30 cm From Medial Instep 47 cm 46.5 cm Ankle Left: Right: Point of Measurement: 12 cm From Medial Instep 33 cm 35 cm Vascular Assessment Blood Pressure: Brachial: [Left:186]  [Right:186] Ankle: [Left:Dorsalis Pedis: 148 0.80] [Right:Dorsalis Pedis: 124 0.67] Electronic Signature(s) Signed: 10/16/2023 4:44:21 PM By: Midge Aver MSN RN CNS WTA Entered By: Midge Aver on 10/16/2023 11:20:07 Quincy Simmonds ([id number]) [id number] Page 5 of 8 -------------------------------------------------------------------------------- Multi Wound Chart Details Patient Name: Date of Service: Satira Mccallum 10/16/2023 2:00 PM Medical Record Number: [id number] Patient Account Number: [id number] Date of Birth/Sex: Treating RN: 1925-06-18 (89 y.o. Ginette Pitman Primary Care Aqueelah Cotrell: [name], Vanessa Kick Other Clinician: Referring Elvira Langston: Treating Kinzlie Harney/Extender: Loa Socks  BSO N, MICHA EL G Self, Referral Weeks in Treatment: 0 Vital Signs Height(in): Pulse(bpm): 87 Weight(lbs): Blood Pressure(mmHg): 186/79 Body Mass Index(BMI): Temperature(F): 97.7 Respiratory Rate(breaths/min): 18 [Treatment Notes:Wound Assessments Treatment Notes] Electronic Signature(s) Signed: 10/17/2023 12:26:56 PM By: Baltazar Najjar MD Entered By: Baltazar Najjar on 10/16/2023 11:41:14 -------------------------------------------------------------------------------- Multi-Disciplinary Care Plan Details Patient Name: Date of Service: [name] [name] [facility] [name] 10/16/2023 2:00 PM Medical Record Number: [id number] Patient Account Number: [id number] Date of Birth/Sex: Treating RN: 1925/07/06 (89 y.o. Ginette Pitman Primary Care Mady Oubre: [name], Vanessa Kick Other Clinician: Referring Torrin Frein: Treating Wright Gravely/Extender: Chauncey Mann, MICHA EL G Self, Referral Weeks in Treatment: 0 Active Inactive Electronic Signature(s) Signed: 10/16/2023 4:44:21 PM By: Midge Aver MSN RN CNS WTA Entered By: Midge Aver on 10/16/2023 11:46:56 -------------------------------------------------------------------------------- Non-Wound Condition Assessment Details Patient Name: Date of Service: [name] [name] [facility] [name] 10/16/2023 2:00 PM Medical Record Number: [id number] Patient Account Number: [id number] Date of Birth/Sex: Treating RN: 07/15/1925 (89 y.o. Maryana, Gignac, Geraldine Contras ([id number]) [id number] Page 6 of 8 Primary Care Jair Lindblad: [name], Jan Other Clinician: Referring Cashton Hosley: Treating Weylin Plagge/Extender: RO BSO N, MICHA EL G Self, Referral Weeks in Treatment: 0 Non-Wound Condition: Condition: Lymphedema Location: Leg Side: Bilateral Photos Lymphedema Assessment Symptoms: (check all that apply) Edema - Progressive, Edema - Unable to Control Primary Lymphedema Tarda (Onset after 35 yrs) Secondary Lymphedema Chronic Venous Insufficiency Electronic Signature(s) Signed: 10/16/2023 4:44:21 PM By: Midge Aver MSN RN CNS WTA Entered By: Midge Aver on 10/16/2023 11:21:18 -------------------------------------------------------------------------------- Pain Assessment Details Patient Name: Date of Service: [name] [name] [facility] [name] 10/16/2023 2:00 PM Medical Record Number: [id number] Patient Account Number: [id number] Date of Birth/Sex: Treating RN: January 23, 1925 (89 y.o. Ginette Pitman Primary Care Yonis Carreon: [name], Vanessa Kick Other Clinician: Referring Dimetrius Montfort: Treating Emil Weigold/Extender: RO BSO N, MICHA EL G Self, Referral Weeks in Treatment: 0 Active Problems Location of Pain Severity and Description of Pain Patient Has Paino Yes Site Locations Rate the pain. ROSALEAH, PROA ([id number]) [id number] Page 7 of 8 Rate the pain. Current Pain Level: 4 Pain Management and Medication Current Pain Management: Electronic Signature(s) Signed: 10/16/2023 4:44:21 PM By: Midge Aver MSN RN CNS WTA Entered By: Midge Aver on 10/16/2023 11:39:42 -------------------------------------------------------------------------------- Patient/Caregiver Education Details Patient Name: Date of Service: [name] [name] [name]  12/26/2024andnbsp2:00 PM Medical Record Number: [id number] Patient Account Number: [id number] Date of Birth/Gender: Treating RN: 03-29-1925 (89 y.o. Ginette Pitman Primary Care Physician: [name], Vanessa Kick Other Clinician: Referring Physician: Treating Physician/Extender: RO BSO Dorris Carnes, MICHA EL G Self, Referral Weeks in Treatment: 0 Education Assessment Education Provided To: Patient Education Topics Provided Discharge Packet: Handouts: Controlling Swelling with Compression Stockings Methods: Explain/Verbal Responses: State content correctly Electronic Signature(s) Signed: 10/16/2023 4:44:21 PM By: Midge Aver MSN RN CNS WTA Entered By: Midge Aver on 10/16/2023 11:47:31 [place], Geraldine Contras ([id number]) [id number] Page 8 of 8 -------------------------------------------------------------------------------- Vitals Details Patient Name: Date of Service: Satira Mccallum 10/16/2023 2:00 PM Medical Record Number: [id number] Patient Account Number: [id number] Date of Birth/Sex: Treating RN: [birth year] (89 y.o. Ginette Pitman Primary Care Lavelle Berland: [name], Vanessa Kick Other Clinician: Referring Garwood Wentzell: Treating Maegen Wigle/Extender: RO BSO N, MICHA EL G Self, Referral Weeks in Treatment: 0 Vital Signs Time Taken: 14:04 Temperature (F): 97.7 Pulse (bpm): 87 Respiratory Rate (breaths/min): 18 Blood Pressure (mmHg): 186/79 Reference Range: 80 - 120 mg / dl Electronic Signature(s) Signed: 10/16/2023 4:44:21 PM By: Midge Aver MSN RN CNS WTA Entered By: Midge Aver on 10/16/2023 11:08:03
